# Patient Record
Sex: Female | Born: 1960 | Race: Black or African American | Hispanic: No | Marital: Married | Smoking: Never smoker
Health system: Southern US, Community
[De-identification: ages and names within clinical notes are randomized; demographics above are authoritative.]

## PROBLEM LIST (undated history)

## (undated) DIAGNOSIS — A159 Respiratory tuberculosis unspecified: Secondary | ICD-10-CM

## (undated) DIAGNOSIS — E7800 Pure hypercholesterolemia, unspecified: Secondary | ICD-10-CM

## (undated) DIAGNOSIS — E785 Hyperlipidemia, unspecified: Secondary | ICD-10-CM

## (undated) DIAGNOSIS — M47816 Spondylosis without myelopathy or radiculopathy, lumbar region: Secondary | ICD-10-CM

## (undated) DIAGNOSIS — I10 Essential (primary) hypertension: Secondary | ICD-10-CM

## (undated) DIAGNOSIS — E039 Hypothyroidism, unspecified: Secondary | ICD-10-CM

## (undated) HISTORY — PX: BACK SURGERY: SHX140

## (undated) HISTORY — DX: Hyperlipidemia, unspecified: E78.5

## (undated) HISTORY — PX: ABDOMINAL HYSTERECTOMY: SHX81

## (undated) HISTORY — PX: BREAST SURGERY: SHX581

---

## 1997-11-13 ENCOUNTER — Emergency Department (HOSPITAL_COMMUNITY): Admission: EM | Admit: 1997-11-13 | Discharge: 1997-11-13 | Payer: Self-pay | Admitting: Emergency Medicine

## 1998-06-12 ENCOUNTER — Encounter: Payer: Self-pay | Admitting: Emergency Medicine

## 1998-06-12 ENCOUNTER — Emergency Department (HOSPITAL_COMMUNITY): Admission: EM | Admit: 1998-06-12 | Discharge: 1998-06-12 | Payer: Self-pay | Admitting: Emergency Medicine

## 1999-05-03 ENCOUNTER — Emergency Department (HOSPITAL_COMMUNITY): Admission: EM | Admit: 1999-05-03 | Discharge: 1999-05-04 | Payer: Self-pay | Admitting: Emergency Medicine

## 1999-05-04 ENCOUNTER — Encounter: Payer: Self-pay | Admitting: Emergency Medicine

## 1999-11-19 ENCOUNTER — Emergency Department (HOSPITAL_COMMUNITY): Admission: EM | Admit: 1999-11-19 | Discharge: 1999-11-19 | Payer: Self-pay | Admitting: Emergency Medicine

## 2001-01-15 ENCOUNTER — Emergency Department (HOSPITAL_COMMUNITY): Admission: EM | Admit: 2001-01-15 | Discharge: 2001-01-15 | Payer: Self-pay

## 2001-01-15 ENCOUNTER — Encounter: Payer: Self-pay | Admitting: Emergency Medicine

## 2001-04-20 ENCOUNTER — Emergency Department (HOSPITAL_COMMUNITY): Admission: EM | Admit: 2001-04-20 | Discharge: 2001-04-20 | Payer: Self-pay | Admitting: Emergency Medicine

## 2001-04-23 ENCOUNTER — Emergency Department (HOSPITAL_COMMUNITY): Admission: EM | Admit: 2001-04-23 | Discharge: 2001-04-23 | Payer: Self-pay | Admitting: Emergency Medicine

## 2001-09-17 ENCOUNTER — Encounter: Payer: Self-pay | Admitting: Orthopedic Surgery

## 2001-09-17 ENCOUNTER — Encounter: Admission: RE | Admit: 2001-09-17 | Discharge: 2001-09-17 | Payer: Self-pay | Admitting: Orthopedic Surgery

## 2001-10-01 ENCOUNTER — Encounter: Admission: RE | Admit: 2001-10-01 | Discharge: 2001-10-01 | Payer: Self-pay | Admitting: Orthopedic Surgery

## 2001-10-01 ENCOUNTER — Encounter: Payer: Self-pay | Admitting: Orthopedic Surgery

## 2001-11-25 ENCOUNTER — Encounter: Admission: RE | Admit: 2001-11-25 | Discharge: 2001-11-25 | Payer: Self-pay | Admitting: *Deleted

## 2001-11-25 ENCOUNTER — Encounter: Payer: Self-pay | Admitting: Orthopedic Surgery

## 2002-02-13 ENCOUNTER — Encounter: Admission: RE | Admit: 2002-02-13 | Discharge: 2002-02-13 | Payer: Self-pay | Admitting: *Deleted

## 2002-02-13 ENCOUNTER — Encounter: Payer: Self-pay | Admitting: *Deleted

## 2003-01-11 ENCOUNTER — Encounter: Admission: RE | Admit: 2003-01-11 | Discharge: 2003-01-11 | Payer: Self-pay | Admitting: Orthopedic Surgery

## 2003-01-29 ENCOUNTER — Encounter: Admission: RE | Admit: 2003-01-29 | Discharge: 2003-01-29 | Payer: Self-pay | Admitting: Orthopedic Surgery

## 2003-05-12 ENCOUNTER — Encounter: Admission: RE | Admit: 2003-05-12 | Discharge: 2003-05-12 | Payer: Self-pay | Admitting: Neurosurgery

## 2005-02-14 ENCOUNTER — Emergency Department (HOSPITAL_COMMUNITY): Admission: EM | Admit: 2005-02-14 | Discharge: 2005-02-14 | Payer: Self-pay | Admitting: Emergency Medicine

## 2005-11-30 ENCOUNTER — Emergency Department (HOSPITAL_COMMUNITY): Admission: EM | Admit: 2005-11-30 | Discharge: 2005-11-30 | Payer: Self-pay | Admitting: Emergency Medicine

## 2006-07-09 ENCOUNTER — Emergency Department (HOSPITAL_COMMUNITY): Admission: EM | Admit: 2006-07-09 | Discharge: 2006-07-09 | Payer: Self-pay | Admitting: Emergency Medicine

## 2006-12-08 ENCOUNTER — Emergency Department (HOSPITAL_COMMUNITY): Admission: EM | Admit: 2006-12-08 | Discharge: 2006-12-08 | Payer: Self-pay | Admitting: Family Medicine

## 2007-02-13 ENCOUNTER — Ambulatory Visit (HOSPITAL_COMMUNITY): Admission: RE | Admit: 2007-02-13 | Discharge: 2007-02-13 | Payer: Self-pay | Admitting: Cardiovascular Disease

## 2007-05-09 ENCOUNTER — Encounter: Admission: RE | Admit: 2007-05-09 | Discharge: 2007-05-09 | Payer: Self-pay | Admitting: Neurosurgery

## 2007-05-28 ENCOUNTER — Ambulatory Visit (HOSPITAL_COMMUNITY): Admission: RE | Admit: 2007-05-28 | Discharge: 2007-05-29 | Payer: Self-pay | Admitting: Neurosurgery

## 2007-06-18 ENCOUNTER — Encounter: Admission: RE | Admit: 2007-06-18 | Discharge: 2007-06-18 | Payer: Self-pay | Admitting: Internal Medicine

## 2007-07-01 ENCOUNTER — Ambulatory Visit (HOSPITAL_COMMUNITY): Admission: RE | Admit: 2007-07-01 | Discharge: 2007-07-01 | Payer: Self-pay | Admitting: Obstetrics & Gynecology

## 2007-07-04 ENCOUNTER — Encounter: Payer: Self-pay | Admitting: Obstetrics

## 2007-07-04 ENCOUNTER — Inpatient Hospital Stay (HOSPITAL_COMMUNITY): Admission: RE | Admit: 2007-07-04 | Discharge: 2007-07-07 | Payer: Self-pay | Admitting: Obstetrics

## 2007-08-11 ENCOUNTER — Emergency Department (HOSPITAL_COMMUNITY): Admission: EM | Admit: 2007-08-11 | Discharge: 2007-08-11 | Payer: Self-pay | Admitting: Emergency Medicine

## 2007-09-02 ENCOUNTER — Encounter: Admission: RE | Admit: 2007-09-02 | Discharge: 2007-09-02 | Payer: Self-pay | Admitting: Neurosurgery

## 2008-07-29 ENCOUNTER — Emergency Department (HOSPITAL_COMMUNITY): Admission: EM | Admit: 2008-07-29 | Discharge: 2008-07-30 | Payer: Self-pay | Admitting: Emergency Medicine

## 2008-09-22 ENCOUNTER — Emergency Department (HOSPITAL_COMMUNITY): Admission: EM | Admit: 2008-09-22 | Discharge: 2008-09-22 | Payer: Self-pay | Admitting: Family Medicine

## 2008-11-03 ENCOUNTER — Emergency Department (HOSPITAL_COMMUNITY): Admission: EM | Admit: 2008-11-03 | Discharge: 2008-11-03 | Payer: Self-pay | Admitting: Family Medicine

## 2009-02-23 ENCOUNTER — Emergency Department (HOSPITAL_COMMUNITY): Admission: EM | Admit: 2009-02-23 | Discharge: 2009-02-23 | Payer: Self-pay | Admitting: Emergency Medicine

## 2009-04-24 ENCOUNTER — Emergency Department (HOSPITAL_COMMUNITY): Admission: EM | Admit: 2009-04-24 | Discharge: 2009-04-24 | Payer: Self-pay | Admitting: Family Medicine

## 2009-11-26 ENCOUNTER — Emergency Department (HOSPITAL_COMMUNITY)
Admission: EM | Admit: 2009-11-26 | Discharge: 2009-11-26 | Payer: Self-pay | Source: Home / Self Care | Admitting: Emergency Medicine

## 2010-01-30 ENCOUNTER — Emergency Department (HOSPITAL_COMMUNITY)
Admission: EM | Admit: 2010-01-30 | Discharge: 2010-01-30 | Payer: Self-pay | Source: Home / Self Care | Admitting: Emergency Medicine

## 2010-01-31 ENCOUNTER — Ambulatory Visit (HOSPITAL_COMMUNITY): Admission: RE | Admit: 2010-01-31 | Discharge: 2010-01-31 | Payer: Self-pay | Admitting: Gastroenterology

## 2010-02-16 ENCOUNTER — Emergency Department (HOSPITAL_COMMUNITY)
Admission: EM | Admit: 2010-02-16 | Discharge: 2010-02-16 | Payer: Self-pay | Source: Home / Self Care | Admitting: Family Medicine

## 2010-04-02 ENCOUNTER — Encounter: Payer: Self-pay | Admitting: Obstetrics

## 2010-05-23 LAB — CBC
HCT: 37.1 % (ref 36.0–46.0)
Hemoglobin: 12.4 g/dL (ref 12.0–15.0)
MCH: 30 pg (ref 26.0–34.0)
MCHC: 33.4 g/dL (ref 30.0–36.0)
MCV: 89.8 fL (ref 78.0–100.0)
Platelets: 303 10*3/uL (ref 150–400)
RBC: 4.13 MIL/uL (ref 3.87–5.11)
RDW: 13 % (ref 11.5–15.5)
WBC: 6.4 10*3/uL (ref 4.0–10.5)

## 2010-05-23 LAB — DIFFERENTIAL
Basophils Absolute: 0 10*3/uL (ref 0.0–0.1)
Basophils Relative: 0 % (ref 0–1)
Eosinophils Absolute: 0.1 K/uL (ref 0.0–0.7)
Eosinophils Relative: 1 % (ref 0–5)
Lymphocytes Relative: 40 % (ref 12–46)
Lymphs Abs: 2.6 K/uL (ref 0.7–4.0)
Monocytes Absolute: 0.3 10*3/uL (ref 0.1–1.0)
Monocytes Relative: 5 % (ref 3–12)
Neutro Abs: 3.4 K/uL (ref 1.7–7.7)
Neutrophils Relative %: 53 % (ref 43–77)

## 2010-05-23 LAB — COMPREHENSIVE METABOLIC PANEL
ALT: 18 U/L (ref 0–35)
AST: 20 U/L (ref 0–37)
Albumin: 3.5 g/dL (ref 3.5–5.2)
Alkaline Phosphatase: 74 U/L (ref 39–117)
Chloride: 107 mEq/L (ref 96–112)
GFR calc Af Amer: >60 mL/min (ref >60)
Potassium: 3.7 mEq/L (ref 3.5–5.1)
Sodium: 139 mEq/L (ref 135–145)
Total Bilirubin: 0.4 mg/dL (ref 0.3–1.2)

## 2010-05-23 LAB — URINALYSIS, ROUTINE W REFLEX MICROSCOPIC
Bilirubin Urine: NEGATIVE
Glucose, UA: NEGATIVE mg/dL
Ketones, ur: NEGATIVE mg/dL
Leukocytes, UA: NEGATIVE
Nitrite: NEGATIVE
Protein, ur: NEGATIVE mg/dL
Specific Gravity, Urine: 1.021 (ref 1.005–1.030)
Urobilinogen, UA: 0.2 mg/dL (ref 0.0–1.0)
pH: 6 (ref 5.0–8.0)

## 2010-05-23 LAB — URINE MICROSCOPIC-ADD ON

## 2010-05-23 LAB — COMPREHENSIVE METABOLIC PANEL WITH GFR
BUN: 14 mg/dL (ref 6–23)
CO2: 26 meq/L (ref 19–32)
Calcium: 9 mg/dL (ref 8.4–10.5)
Creatinine, Ser: 0.72 mg/dL (ref 0.4–1.2)
GFR calc non Af Amer: >60 mL/min (ref >60)
Glucose, Bld: 97 mg/dL (ref 70–99)
Total Protein: 6.7 g/dL (ref 6.0–8.3)

## 2010-05-23 LAB — PROTIME-INR
INR: 0.98 (ref 0.00–1.49)
Prothrombin Time: 13.2 s (ref 11.6–15.2)

## 2010-05-23 LAB — APTT: aPTT: 33 s (ref 24–37)

## 2010-05-23 LAB — URINE CULTURE
Colony Count: NO GROWTH
Culture  Setup Time: 201111211419
Culture: NO GROWTH

## 2010-05-23 LAB — HEMOCCULT GUIAC POC 1CARD (OFFICE): Fecal Occult Bld: NEGATIVE

## 2010-05-31 LAB — POCT URINALYSIS DIP (DEVICE)
Protein, ur: NEGATIVE mg/dL
Specific Gravity, Urine: 1.025 (ref 1.005–1.030)
Urobilinogen, UA: 0.2 mg/dL (ref 0.0–1.0)

## 2010-05-31 LAB — URINE CULTURE

## 2010-06-17 LAB — POCT URINALYSIS DIP (DEVICE)
Glucose, UA: NEGATIVE mg/dL
Ketones, ur: NEGATIVE mg/dL
Specific Gravity, Urine: 1.02 (ref 1.005–1.030)
Urobilinogen, UA: 0.2 mg/dL (ref 0.0–1.0)

## 2010-07-08 ENCOUNTER — Inpatient Hospital Stay (INDEPENDENT_AMBULATORY_CARE_PROVIDER_SITE_OTHER)
Admission: RE | Admit: 2010-07-08 | Discharge: 2010-07-08 | Disposition: A | Discharge status: Home / Self Care | Payer: Federal, State, Local not specified - PPO | Source: Ambulatory Visit | Attending: Family Medicine | Admitting: Family Medicine

## 2010-07-08 DIAGNOSIS — N200 Calculus of kidney: Secondary | ICD-10-CM

## 2010-07-08 LAB — POCT URINALYSIS DIP (DEVICE)
Bilirubin Urine: NEGATIVE
Glucose, UA: NEGATIVE mg/dL
Nitrite: NEGATIVE
Urobilinogen, UA: 0.2 mg/dL (ref 0.0–1.0)

## 2010-07-10 ENCOUNTER — Emergency Department (HOSPITAL_COMMUNITY): Payer: Federal, State, Local not specified - PPO

## 2010-07-10 ENCOUNTER — Emergency Department (HOSPITAL_COMMUNITY)
Admission: EM | Admit: 2010-07-10 | Discharge: 2010-07-10 | Disposition: A | Discharge status: Home / Self Care | Payer: Federal, State, Local not specified - PPO | Attending: Emergency Medicine | Admitting: Emergency Medicine

## 2010-07-10 DIAGNOSIS — R109 Unspecified abdominal pain: Secondary | ICD-10-CM | POA: Insufficient documentation

## 2010-07-10 DIAGNOSIS — K921 Melena: Secondary | ICD-10-CM | POA: Insufficient documentation

## 2010-07-10 DIAGNOSIS — E7800 Pure hypercholesterolemia, unspecified: Secondary | ICD-10-CM | POA: Insufficient documentation

## 2010-07-10 DIAGNOSIS — K625 Hemorrhage of anus and rectum: Secondary | ICD-10-CM | POA: Insufficient documentation

## 2010-07-10 DIAGNOSIS — Z79899 Other long term (current) drug therapy: Secondary | ICD-10-CM | POA: Insufficient documentation

## 2010-07-10 DIAGNOSIS — K644 Residual hemorrhoidal skin tags: Secondary | ICD-10-CM | POA: Insufficient documentation

## 2010-07-10 DIAGNOSIS — N201 Calculus of ureter: Secondary | ICD-10-CM | POA: Insufficient documentation

## 2010-07-10 DIAGNOSIS — I10 Essential (primary) hypertension: Secondary | ICD-10-CM | POA: Insufficient documentation

## 2010-07-10 DIAGNOSIS — R6883 Chills (without fever): Secondary | ICD-10-CM | POA: Insufficient documentation

## 2010-07-10 DIAGNOSIS — E0590 Thyrotoxicosis, unspecified without thyrotoxic crisis or storm: Secondary | ICD-10-CM | POA: Insufficient documentation

## 2010-07-10 LAB — URINALYSIS, ROUTINE W REFLEX MICROSCOPIC
Glucose, UA: NEGATIVE mg/dL
Ketones, ur: NEGATIVE mg/dL
Nitrite: NEGATIVE
Protein, ur: NEGATIVE mg/dL
Urobilinogen, UA: 0.2 mg/dL (ref 0.0–1.0)

## 2010-07-10 LAB — POCT I-STAT, CHEM 8
Calcium, Ion: 1.28 mmol/L (ref 1.12–1.32)
HCT: 36 % (ref 36.0–46.0)
Hemoglobin: 12.2 g/dL (ref 12.0–15.0)
Sodium: 143 mEq/L (ref 135–145)
TCO2: 29 mmol/L (ref 0–100)

## 2010-07-10 LAB — OCCULT BLOOD, POC DEVICE: Fecal Occult Bld: POSITIVE

## 2010-07-10 MED ORDER — IOHEXOL 300 MG/ML  SOLN
80.0000 mL | Freq: Once | INTRAMUSCULAR | Status: AC | PRN
  Administered 2010-07-10: 80 mL via INTRAVENOUS

## 2010-07-25 NOTE — Op Note (Signed)
Autumn Green, Autumn Green                 ACCOUNT NO.:  [id number]   MEDICAL RECORD NO.:  [id number]          PATIENT TYPE:  INP   LOCATION:  9308                          FACILITY:  [facility]   PHYSICIAN:  Adolph Pollack, M.D.DATE OF BIRTH:  [birth year]   DATE OF PROCEDURE:  07/04/2007  DATE OF DISCHARGE:                               OPERATIVE REPORT   PREOPERATIVE DIAGNOSES:  Pelvic mass.   POSTOPERATIVE DIAGNOSES:  Pelvic mass with dense small bowel to pelvic  mass adhesions and enterotomy.   PROCEDURE:  Lysis of adhesions, excision of cystic pelvic mass and  repair of enterotomy.   SURGEON:  Adolph Pollack, M.D.   ASSISTANT:  Bing Neighbors. Clearance Coots, M.D.   INDICATIONS:  A 50 year old female was brought to the operating room by  Dr. Clearance Coots for excision of pelvic mass.  I was called in as adhesions  between the mass and small bowel were dense and enterotomy was made.   The patient had a lower transverse incision.  The enterotomy was  visualized on the right side.  I first began taking down dense adhesions  between the cyst in the inferior abdominal wall sharply.  I then used  traction on the cystic and counter traction on the small bowel to divide  the thin adhesions between the cyst and the small bowel in all  directions freeing up the cyst from the small bowel.  I made two small  holes in the cyst and clear fluid was drained and sent for cytology.  The cyst was eventually excised sharply.  It took 45 minutes to mobilize  the adhesions between the cyst and small bowel and allow the cyst to be  excised.   Following this, I then mobilized a segment of small intestine with the  enterotomy using sharp dissection and electrocautery dissecting it free  from the anterior abdominal wall.  I then repaired the enterotomy which  was about 25% of the circumference in two layers.  The first layer was a  running full-thickness 3-0 Vicryl suture.  The second layer was  interrupted 3-0 silk  sutures in a Lembert type fashion.  The anastomosis  was tight without leak with testing and was patent and viable.  I  reinspected the rest of the small intestine in the area and noted no  injuries. Following this, Dr. Clearance Coots proceeded with the remaining  portion of his operation.      Adolph Pollack, M.D.  Electronically Signed     TJR/MEDQ  D:  07/04/2007  T:  07/04/2007  Job:  [id number]   cc:   Leonette Most A. Clearance Coots, M.D.  Fax: [phone]

## 2010-07-25 NOTE — Op Note (Signed)
NAME:  Autumn Green, ROA NO.:  [id number]   MEDICAL RECORD NO.:  [id number]          PATIENT TYPE:  OIB   LOCATION:  3533                         FACILITY:  [facility]   PHYSICIAN:  Donalee Citrin, M.D.        DATE OF BIRTH:  27-May-1960   DATE OF PROCEDURE:  05/28/2007  DATE OF DISCHARGE:                               OPERATIVE REPORT   PREOPERATIVE DIAGNOSIS:  Lumbar spondylosis with radiculopathy and  ruptured disk L4-5 right.   PROCEDURE:  Lumbar laminectomy microdiskectomy L4-5 right with  microdissection of the right L5 nerve root microscopic diskectomy.   SURGEON:  Donalee Citrin, M.D.   ASSISTANT:  Reinaldo Meeker, M.D.   ANESTHESIA:  General endotracheal anesthesia.   HISTORY OF PRESENT ILLNESS:  The patient is very pleasant 47-year female  who has a longstanding back pain and right leg pain from a work-related  injury that we have followed along for several years and she got  progressively worse.  Failed all forms of conservative treatment.  MRI  scan showed severe spondylosis but also foraminal stenosis from an  annular tear and a bulging disk at L4-5 causing displacing of the right  L5 nerve root.  Due to the patient's failure to conservative treatment  and MRI findings, patient was recommended laminectomy, diskectomy.  Risks and benefits of the operation were explained to the patient who  understands and agreed to proceed forward.   The patient was brought to the OR and after general anesthesia, was  positioned prone on the Wilson frame.  Back was prepped and draped in  the usual sterile fashion.  Preoperative x-ray was used to localize the  appropriate level.  A midline incision after infiltration with 10 mL  lidocaine with epinephrine and Bovie electrocautery was used to take  down __________ subperiosteal.  Dissection carried out on the lamina of  L4 and L5.  Intraoperative x-ray confirmed localization of the  appropriate level and as I was drilling off the  lateral aspect of the  tip of the spinous process, I realized that I had taken the muscle away  and stripped it down on the left side so I immediately removed the  retractor and confirmed over the right side, did a subperiosteal  dissection on the right side at L4-5, confirmed with x-ray.  No  significant bony laminotomy work was done at all on the left side prior  to transferring the retractor.  Then, after confirming appropriate level  on the right side at L4-5, the high speed drill was used to drill in the  medial facet complex, inferior aspect of lamina of L4, superior aspect  of lamina of L5 and lateral on spinous process.  Then using a 2 mm  Kerrison punch, superior lamina of L4 was removed, medial facet complex  was under-bitten and the superior aspect of the lamina of five was  removed.  Ligament was then identified, teased away with a nerve hook  and removed piecemeal fashion exposing the thecal sac.  At this point,  the operative microscope was draped and brought into  the field for  microscopic illumination.  The undersurface of the ligament was  dissected off of the dura and under-bitten laterally.  What was noted on  the right side was significant diastasis of the right L4-5 facet.  This  probably had about 2-3 mm of diastasis, so care was taken to minimize  the amount of facetectomy performed just enough to gain lateral aspect  to the lateral margins of the dura.  There was noted to be dense  epidural venous collection, these were coagulated with bipolar  electrocautery and divided.  Exposing the disk space, there was noted to  be bulging and displacing the inferior aspect of the L5 nerve root.  The  L5 nerve root was then dissected off of this very carefully with IV  Penfield and reflected medially with a D'Errico.  Then, at this point,  the annulotomy was made with a #11 blade scalpel and disk space was  radically cleaned out and several large fragments were removed from  the  disk.  Then after complete decompression of the L5 nerve root explored  with a coronary dilating hockey stick, the wound was then copiously  irrigated.  Meticulous hemostasis was maintained.  Gelfoam overlaid at  the top of the dura.  The muscle and fascia were closed in layers with  interrupted Vicryl as well as the subcutaneous tissues in the dermis was  closed with running 4-0 subcuticular.  Benzoin and Steri-Strips was  applied and the patient went to the recovery room in stable condition.  At the end of the case sponge and needle counts were correct.           ______________________________  Donalee Citrin, M.D.     GC/MEDQ  D:  05/28/2007  T:  05/28/2007  Job:  [id number]

## 2010-07-25 NOTE — Consult Note (Signed)
NAMEARLESIA, KIEL                 ACCOUNT NO.:  [id number]   MEDICAL RECORD NO.:  [id number]          PATIENT TYPE:  INP   LOCATION:  9308                          FACILITY:  [facility]   PHYSICIAN:  Adolph Pollack, M.D.DATE OF BIRTH:  1960/09/09   DATE OF CONSULTATION:  07/04/2007  DATE OF DISCHARGE:                                 CONSULTATION   REFERRING PHYSICIAN:  Charles A. Clearance Coots, M.D.   HISTORY:  This is a 50 year old female who was found to have what  appeared to be a cystic pelvic mass.  Dr. Clearance Coots brought her to the  operating room at [facility] for exploratory laparotomy and  noticed dense adhesions between the small intestine and the mass and  also a small enterotomy was made.  I subsequently was called for  intraoperative consultation and to assist with adhesions and repair of  enterotomy.   Upon arrival, the patient was supine on the table with a lower  transverse incision.  There was a suture by small enterotomy in the  small intestine near the right lower abdominal sidewall adherent to the  peritoneum and rectus muscle with minimal spillage.  There was also a  cystic structure with dense adhesions to the small bowel.   IMPRESSION:  Dense pelvic adhesions from multiple previous surgeries and  small enterotomy as well.   PLAN:  Will perform adhesiolysis and repair of enterotomy.      Adolph Pollack, M.D.  Electronically Signed     TJR/MEDQ  D:  07/04/2007  T:  07/04/2007  Job:  [id number]   cc:   Leonette Most A. Clearance Coots, M.D.  Fax: [phone]

## 2010-07-25 NOTE — Op Note (Signed)
Autumn Green, Autumn Green                 ACCOUNT NO.:  [id number]   MEDICAL RECORD NO.:  [id number]          PATIENT TYPE:  INP   LOCATION:  9308                          [facility]   PHYSICIAN:  Charles A. Clearance Coots, M.D.DATE OF BIRTH:  10/21/1960   DATE OF PROCEDURE:  07/04/2007  DATE OF DISCHARGE:                               OPERATIVE REPORT   PREOPERATIVE DIAGNOSES:  Right adnexal cyst on ultrasound and computed  tomography scan, pelvic pain, status post total abdominal hysterectomy,  right salpingo-oophorectomy, left salpingectomy.   POSTOPERATIVE DIAGNOSES:  Right adnexal cyst on ultrasound and computed  tomography scan, pelvic pain, status post total abdominal hysterectomy,  right salpingo-oophorectomy, left salpingectomy.  Multiple bowel  adhesions with cyst and anterior and lateral peritoneal surface.   PROCEDURE:  Exploratory laparotomy, adhesiolysis, resection of  peritoneal cyst, repair of small enterotomy.   SURGEON:  1. Charles A. Clearance Coots, M.D.  2. Roseanna Rainbow, M.D.  3. Adolph Pollack, M.D.   ANESTHESIA:  General.   ESTIMATED BLOOD LOSS:  500 mL   IV FLUIDS:  4500 mL   URINE OUTPUT:  300 mL, clear.   SPECIMEN:  1. Peritoneal cyst.  2. Pelvic washings   DISPOSITION/SPECIMEN:  Pathology.   DESCRIPTION OF PROCEDURE:  The patient was brought to the operating  room.  After satisfactory general endotracheal anesthesia, the abdomen  was prepped and draped in routine fashion with an indwelling Foley.  A  Pfannenstiel skin incision was made through the previous scar with a  scalpel down to the fascia.  The fascia was nicked in the midline and  the fascial incision was extended to the left and to right with curved  Mayo scissors.  The superior and inferior fascial edges were taken off  of the rectus muscles with both blunt and sharp dissection.  The rectus  muscle was sharply divided in the midline and the peritoneum was entered  digitally and was  digitally extended to the left and to right.  Abdominal and pelvic exploration revealed multiple bowel adhesions  between the small bowel and the anterior and lateral peritoneal  surfaces.  The peritoneal cyst was then identified anterolaterally,  adhesed to the peritoneal wall and bowel.  General surgery was then  called for assistance with lysis of adhesions and resection of the  peritoneal incision.  Dr. Avel Peace assisted.  At the time of his  exploration, a small enterotomy was noted in which he repaired and then  proceeded with adhesiolysis.  This will be dictated separately by Dr.  Abbey Chatters.  The peritoneal cyst after being freed from the bowel and  peritoneal surfaces was then resected and submitted to pathology for  evaluation.  Hemostasis was then obtained with cauterization and suture  ligation of peritoneal surfaces.  Further exploration of the pelvic  cavity was done and no further pathology was observed.  The abdomen was  then closed.  The rectus muscle was closed with a continuous suture of 2-  0 Monocryl.  The fascia was closed with a continuous suture of 0 PDS  from each corner to  the center.  The subcutaneous tissue was thoroughly  irrigated with warm saline solution and all areas of subcutaneous  bleeding were coagulated with the Bovie.  The skin was then closed with  a continuous subcuticular suture of 3-0 Monocryl.  A sterile bandage was  applied to the incision closure.  The surgical technician indicated that  all needle, sponge and instrument counts were correct x2.  The patient  tolerated the procedure well and was transported to recovery room in  satisfactory condition.      Charles A. Clearance Coots, M.D.  Electronically Signed     CAH/MEDQ  D:  07/04/2007  T:  07/04/2007  Job:  [id number]

## 2010-07-28 NOTE — Discharge Summary (Signed)
Autumn Green, Autumn Green                 ACCOUNT NO.:  [id number]   MEDICAL RECORD NO.:  [id number]          PATIENT TYPE:  INP   LOCATION:  9308                          [facility]ILITY[facility]   PHYSICIAN:  Charles A. Clearance Coots, M.D.DATE OF BIRTH:  06-11-1960   DATE OF ADMISSION:  07/04/2007  DATE OF DISCHARGE:  07/07/2007                               DISCHARGE SUMMARY   ADMITTING DIAGNOSES:  1. Right adnexal mass, status post total abdominal hysterectomy.  2. Right salpingo-oophorectomy.  3. Left salpingectomy.   DISCHARGE DIAGNOSES:  1. Right adnexal mass, status post total abdominal hysterectomy.  2. Right salpingo-oophorectomy.  3. Left salpingectomy.   Discharge home in good condition after exploratory laparotomy, lysis of  adhesions, and repair of small enterotomy and resection of right adnexal  mass.   REASON FOR ADMISSION:  A 50 year old black female referred from Dr.  Dorothyann Peng and tried internal medicine for the right adnexal mass and  pelvic pain.  The patient's status post total abdominal hysterectomy  right salpingo-oophorectomy and left salpingectomy in 2004, presented  with pelvic pain and CT scan and ultrasound revealed approximately 10 cm  right adnexal cystic and septated mass.  CA-125 levels were drawn, which  were within normal limits.   PAST MEDICAL HISTORY/SURGERY:  1. Back surgery in 2009.  2. Lysis of adhesions and left salpingectomy for ectopic pregnancy in      2004.  3. Total abdominal hysterectomy, right salpingo-oophorectomy and left      salpingectomy in 2004.   ILLNESSES:  Hypertension.   MEDICATIONS:  1. Propranolol.  2. Hydrochlorothiazide.   ALLERGIES:  No known drug allergies.   FAMILY HISTORY:  Positive for colon cancer and bone cancer.   PHYSICAL EXAMINATION:  Well-nourished, well-developed female in no acute  distress.  Temperature 97.9, pulse 80, respiratory rate 18, and blood pressure  138/87.  LUNGS: Clear to auscultation  bilaterally.  HEART: Regular rate and rhythm.  ABDOMEN: Soft, tender right lower quadrant.  PELVIC EXAM: She has normal external female genitalia.  Vaginal mucosa  normal.  Cervix absent.  Uterus absent.  ADNEXAL EXAM: Remarkable for right adnexal tenderness.  EXTREMITIES: Without cyanosis, clubbing or erythema.   ADMITTING LABS:  Hemoglobin 12.4, hematocrit 35, white blood cell count  6300, and platelets 286,000.  Comprehensive metabolic panel was within  normal limits.  CA-125 levels were within normal limits.   HOSPITAL COURSE:  The patient underwent exploratory laparotomy, lysis of  multiple adhesions, repair of small bowel enterotomy, and resection of  right adnexal mass, which appeared to be peritoneal cyst.  There were no  other intraoperative complications except for the small enterotomy in  the small intestine, which was repaired by Dr. Avel Peace, general  surgeon.  The patient did well postoperatively and she was discharged  home on postop day #3 in good condition.   DISCHARGE LABS:  Hemoglobin 9.9, hematocrit 27.7, white blood cell count  12,100, and platelets 248,000.   DISCHARGE DISPOSITION:  Medications:  Tylox and ibuprofen were  prescribed for pain.  Routine written instructions were given for  discharge after major  abdominal surgery.  The patient is to call office  for follow up appointment in 2 weeks.      Charles A. Clearance Coots, M.D.  Electronically Signed     CAH/MEDQ  D:  07/24/2007  T:  07/24/2007  Job:  [id number]   cc:   Adolph Pollack, M.D.  [address]  [place]  [place] [address]   Candyce Churn. Allyne Gee, M.D.  Fax: [phone]

## 2010-10-05 ENCOUNTER — Other Ambulatory Visit: Payer: Self-pay | Admitting: Neurosurgery

## 2010-10-05 DIAGNOSIS — M545 Low back pain, unspecified: Secondary | ICD-10-CM

## 2010-10-19 ENCOUNTER — Other Ambulatory Visit: Payer: Federal, State, Local not specified - PPO

## 2010-10-20 ENCOUNTER — Other Ambulatory Visit: Payer: Self-pay | Admitting: Neurosurgery

## 2010-10-20 ENCOUNTER — Ambulatory Visit
Admission: RE | Admit: 2010-10-20 | Discharge: 2010-10-20 | Disposition: A | Discharge status: Home / Self Care | Payer: Federal, State, Local not specified - PPO | Source: Ambulatory Visit | Attending: Neurosurgery | Admitting: Neurosurgery

## 2010-10-20 ENCOUNTER — Ambulatory Visit
Admission: RE | Admit: 2010-10-20 | Discharge: 2010-10-20 | Disposition: A | Discharge status: Home / Self Care | Payer: Self-pay | Source: Ambulatory Visit | Attending: Neurosurgery | Admitting: Neurosurgery

## 2010-10-20 DIAGNOSIS — M545 Low back pain, unspecified: Secondary | ICD-10-CM

## 2010-10-29 ENCOUNTER — Ambulatory Visit (INDEPENDENT_AMBULATORY_CARE_PROVIDER_SITE_OTHER): Payer: Federal, State, Local not specified - PPO

## 2010-10-29 ENCOUNTER — Inpatient Hospital Stay (INDEPENDENT_AMBULATORY_CARE_PROVIDER_SITE_OTHER)
Admission: RE | Admit: 2010-10-29 | Discharge: 2010-10-29 | Disposition: A | Discharge status: Home / Self Care | Payer: Federal, State, Local not specified - PPO | Source: Ambulatory Visit | Attending: Family Medicine | Admitting: Family Medicine

## 2010-10-29 DIAGNOSIS — R10819 Abdominal tenderness, unspecified site: Secondary | ICD-10-CM

## 2010-10-29 LAB — DIFFERENTIAL
Basophils Absolute: 0 10*3/uL (ref 0.0–0.1)
Basophils Relative: 0 % (ref 0–1)
Lymphocytes Relative: 45 % (ref 12–46)
Monocytes Absolute: 0.5 10*3/uL (ref 0.1–1.0)
Neutro Abs: 2.8 10*3/uL (ref 1.7–7.7)

## 2010-10-29 LAB — POCT URINALYSIS DIP (DEVICE)
Glucose, UA: NEGATIVE mg/dL
Leukocytes, UA: NEGATIVE
Nitrite: NEGATIVE
Urobilinogen, UA: 0.2 mg/dL (ref 0.0–1.0)

## 2010-10-29 LAB — COMPREHENSIVE METABOLIC PANEL
AST: 16 U/L (ref 0–37)
BUN: 14 mg/dL (ref 6–23)
CO2: 31 mEq/L (ref 19–32)
Calcium: 10.1 mg/dL (ref 8.4–10.5)
Creatinine, Ser: 0.68 mg/dL (ref 0.50–1.10)
GFR calc Af Amer: >60 mL/min (ref >60)
GFR calc non Af Amer: >60 mL/min (ref >60)

## 2010-10-29 LAB — CBC
MCH: 29.5 pg (ref 26.0–34.0)
MCV: 85.7 fL (ref 78.0–100.0)
Platelets: 312 10*3/uL (ref 150–400)
RBC: 4.34 MIL/uL (ref 3.87–5.11)

## 2010-10-31 ENCOUNTER — Other Ambulatory Visit: Payer: Self-pay | Admitting: Gastroenterology

## 2010-10-31 ENCOUNTER — Ambulatory Visit
Admission: RE | Admit: 2010-10-31 | Discharge: 2010-10-31 | Disposition: A | Discharge status: Home / Self Care | Payer: Federal, State, Local not specified - PPO | Source: Ambulatory Visit | Attending: Gastroenterology | Admitting: Gastroenterology

## 2010-10-31 MED ORDER — IOHEXOL 300 MG/ML  SOLN
125.0000 mL | Freq: Once | INTRAMUSCULAR | Status: AC | PRN
  Administered 2010-10-31: 125 mL via INTRAVENOUS

## 2010-11-07 ENCOUNTER — Other Ambulatory Visit: Payer: Self-pay | Admitting: Neurosurgery

## 2010-11-07 DIAGNOSIS — M549 Dorsalgia, unspecified: Secondary | ICD-10-CM

## 2010-11-08 ENCOUNTER — Ambulatory Visit
Admission: RE | Admit: 2010-11-08 | Discharge: 2010-11-08 | Disposition: A | Discharge status: Home / Self Care | Payer: Worker's Compensation | Source: Ambulatory Visit | Attending: Neurosurgery | Admitting: Neurosurgery

## 2010-11-08 ENCOUNTER — Ambulatory Visit
Admission: RE | Admit: 2010-11-08 | Discharge: 2010-11-08 | Disposition: A | Discharge status: Home / Self Care | Payer: Self-pay | Source: Ambulatory Visit | Attending: Neurosurgery | Admitting: Neurosurgery

## 2010-11-08 ENCOUNTER — Other Ambulatory Visit: Payer: Self-pay | Admitting: Neurosurgery

## 2010-11-08 DIAGNOSIS — M545 Low back pain, unspecified: Secondary | ICD-10-CM

## 2010-11-08 DIAGNOSIS — M549 Dorsalgia, unspecified: Secondary | ICD-10-CM

## 2010-11-08 HISTORY — DX: Respiratory tuberculosis unspecified: A15.9

## 2010-11-08 HISTORY — DX: Hypothyroidism, unspecified: E03.9

## 2010-11-08 HISTORY — DX: Essential (primary) hypertension: I10

## 2010-11-08 MED ORDER — KETOROLAC TROMETHAMINE 30 MG/ML IJ SOLN
30.0000 mg | Freq: Once | INTRAMUSCULAR | Status: AC
  Administered 2010-11-08: 30 mg via INTRAVENOUS

## 2010-11-08 MED ORDER — SODIUM CHLORIDE 0.9 % IV SOLN: 4.0000 mg | Freq: Four times a day (QID) | INTRAVENOUS | Status: DC | PRN

## 2010-11-08 MED ORDER — DEXTROSE 5 % IV SOLN
1.0000 g | Freq: Once | INTRAVENOUS | Status: AC
  Administered 2010-11-08: 1 g via INTRAVENOUS

## 2010-11-08 MED ORDER — SODIUM CHLORIDE 0.9 % IV SOLN
Freq: Once | INTRAVENOUS | Status: AC
  Administered 2010-11-08: 08:00:00 via INTRAVENOUS

## 2010-11-08 MED ORDER — IOHEXOL 180 MG/ML  SOLN
5.0000 mL | Freq: Once | INTRAMUSCULAR | Status: AC | PRN
  Administered 2010-11-08: 5 mL

## 2010-11-08 MED ORDER — MIDAZOLAM HCL 2 MG/2ML IJ SOLN
1.0000 mg | INTRAMUSCULAR | Status: DC | PRN
  Administered 2010-11-08: 1 mg via INTRAVENOUS

## 2010-11-08 MED ORDER — FENTANYL CITRATE 0.05 MG/ML IJ SOLN
25.0000 ug | INTRAMUSCULAR | Status: DC | PRN
  Administered 2010-11-08: 75 ug via INTRAVENOUS

## 2010-11-08 NOTE — Progress Notes (Signed)
Resting comfortably on stomach after CT scan.  Tolerating sips of Sprite.  jkl

## 2010-11-08 NOTE — Progress Notes (Signed)
Pt states her back pain started in May 2012 and has advanced to pain down her left leg. Pt is at a 5-6 at present on 0-10 scale. Pt is here for a discogram.

## 2010-11-16 ENCOUNTER — Other Ambulatory Visit: Payer: Self-pay | Admitting: Gastroenterology

## 2010-11-16 DIAGNOSIS — R102 Pelvic and perineal pain: Secondary | ICD-10-CM

## 2010-11-21 ENCOUNTER — Emergency Department (HOSPITAL_COMMUNITY): Payer: Federal, State, Local not specified - PPO

## 2010-11-21 ENCOUNTER — Other Ambulatory Visit: Payer: Worker's Compensation

## 2010-11-21 ENCOUNTER — Emergency Department (HOSPITAL_COMMUNITY)
Admission: EM | Admit: 2010-11-21 | Discharge: 2010-11-21 | Disposition: A | Discharge status: Home / Self Care | Payer: Federal, State, Local not specified - PPO | Attending: Emergency Medicine | Admitting: Emergency Medicine

## 2010-11-21 DIAGNOSIS — N949 Unspecified condition associated with female genital organs and menstrual cycle: Secondary | ICD-10-CM | POA: Insufficient documentation

## 2010-11-21 DIAGNOSIS — Z90710 Acquired absence of both cervix and uterus: Secondary | ICD-10-CM | POA: Insufficient documentation

## 2010-11-21 DIAGNOSIS — Z9079 Acquired absence of other genital organ(s): Secondary | ICD-10-CM | POA: Insufficient documentation

## 2010-11-21 DIAGNOSIS — R109 Unspecified abdominal pain: Secondary | ICD-10-CM | POA: Insufficient documentation

## 2010-11-21 DIAGNOSIS — E7800 Pure hypercholesterolemia, unspecified: Secondary | ICD-10-CM | POA: Insufficient documentation

## 2010-11-21 DIAGNOSIS — I10 Essential (primary) hypertension: Secondary | ICD-10-CM | POA: Insufficient documentation

## 2010-11-21 LAB — COMPREHENSIVE METABOLIC PANEL
ALT: 18 U/L (ref 0–35)
AST: 16 U/L (ref 0–37)
Albumin: 3.6 g/dL (ref 3.5–5.2)
Alkaline Phosphatase: 88 U/L (ref 39–117)
BUN: 14 mg/dL (ref 6–23)
CO2: 29 mEq/L (ref 19–32)
Calcium: 10 mg/dL (ref 8.4–10.5)
Chloride: 100 mEq/L (ref 96–112)
Creatinine, Ser: 0.63 mg/dL (ref 0.50–1.10)
GFR calc Af Amer: >60 mL/min (ref >60)
GFR calc non Af Amer: >60 mL/min (ref >60)
Glucose, Bld: 103 mg/dL — ABNORMAL HIGH (ref 70–99)
Potassium: 3.5 mEq/L (ref 3.5–5.1)
Sodium: 138 mEq/L (ref 135–145)
Total Bilirubin: 0.3 mg/dL (ref 0.3–1.2)
Total Protein: 7.6 g/dL (ref 6.0–8.3)

## 2010-11-21 LAB — DIFFERENTIAL
Basophils Absolute: 0 10*3/uL (ref 0.0–0.1)
Basophils Relative: 0 % (ref 0–1)
Eosinophils Absolute: 0.1 10*3/uL (ref 0.0–0.7)
Eosinophils Relative: 1 % (ref 0–5)
Lymphocytes Relative: 40 % (ref 12–46)
Lymphs Abs: 2.4 10*3/uL (ref 0.7–4.0)
Monocytes Absolute: 0.5 10*3/uL (ref 0.1–1.0)
Monocytes Relative: 8 % (ref 3–12)
Neutro Abs: 3 10*3/uL (ref 1.7–7.7)
Neutrophils Relative %: 50 % (ref 43–77)

## 2010-11-21 LAB — URINALYSIS, ROUTINE W REFLEX MICROSCOPIC
Bilirubin Urine: NEGATIVE
Glucose, UA: NEGATIVE mg/dL
Ketones, ur: NEGATIVE mg/dL
Leukocytes, UA: NEGATIVE
Nitrite: NEGATIVE
Protein, ur: NEGATIVE mg/dL
Specific Gravity, Urine: 1.025 (ref 1.005–1.030)
Urobilinogen, UA: 0.2 mg/dL (ref 0.0–1.0)
pH: 6 (ref 5.0–8.0)

## 2010-11-21 LAB — URINE MICROSCOPIC-ADD ON

## 2010-11-21 LAB — LIPASE, BLOOD: Lipase: 19 U/L (ref 11–59)

## 2010-11-21 LAB — CBC
HCT: 36.4 % (ref 36.0–46.0)
Hemoglobin: 12.3 g/dL (ref 12.0–15.0)
MCH: 29 pg (ref 26.0–34.0)
MCHC: 33.8 g/dL (ref 30.0–36.0)
MCV: 85.8 fL (ref 78.0–100.0)
Platelets: 293 10*3/uL (ref 150–400)
RBC: 4.24 MIL/uL (ref 3.87–5.11)
RDW: 13.2 % (ref 11.5–15.5)
WBC: 6 10*3/uL (ref 4.0–10.5)

## 2010-11-22 LAB — URINE CULTURE
Colony Count: NO GROWTH
Culture: NO GROWTH

## 2010-12-04 LAB — BASIC METABOLIC PANEL
BUN: 14
Calcium: 9.9
Creatinine, Ser: 0.76
GFR calc non Af Amer: >60
Glucose, Bld: 104 — ABNORMAL HIGH
Sodium: 139

## 2010-12-04 LAB — CBC
Hemoglobin: 13.5
Platelets: 380
RDW: 13.3

## 2010-12-05 LAB — CBC
HCT: 27.7 — ABNORMAL LOW
Hemoglobin: 12.4
Hemoglobin: 9.9 — ABNORMAL LOW
MCHC: 35.7
RBC: 3.15 — ABNORMAL LOW
RBC: 4.03
RDW: 13.4
WBC: 6.3

## 2010-12-05 LAB — BASIC METABOLIC PANEL
CO2: 27
Chloride: 100
Glucose, Bld: 93
Potassium: 3.5
Sodium: 136

## 2010-12-07 LAB — POCT URINALYSIS DIP (DEVICE)
Bilirubin Urine: NEGATIVE
Nitrite: NEGATIVE
Specific Gravity, Urine: 1.015
Urobilinogen, UA: 0.2
pH: 5.5

## 2011-03-12 ENCOUNTER — Encounter (HOSPITAL_COMMUNITY): Payer: Self-pay

## 2011-03-12 ENCOUNTER — Emergency Department (INDEPENDENT_AMBULATORY_CARE_PROVIDER_SITE_OTHER)
Admission: EM | Admit: 2011-03-12 | Discharge: 2011-03-12 | Disposition: A | Discharge status: Home / Self Care | Payer: Federal, State, Local not specified - PPO | Source: Home / Self Care | Attending: Emergency Medicine | Admitting: Emergency Medicine

## 2011-03-12 DIAGNOSIS — R059 Cough, unspecified: Secondary | ICD-10-CM

## 2011-03-12 DIAGNOSIS — R05 Cough: Secondary | ICD-10-CM

## 2011-03-12 HISTORY — DX: Pure hypercholesterolemia, unspecified: E78.00

## 2011-03-12 MED ORDER — PREDNISONE 10 MG PO TABS: 20.0000 mg | ORAL_TABLET | Freq: Two times a day (BID) | ORAL | Status: DC

## 2011-03-12 MED ORDER — AZITHROMYCIN 250 MG PO TABS: 250.0000 mg | ORAL_TABLET | Freq: Every day | ORAL | Status: AC

## 2011-03-12 NOTE — ED Provider Notes (Signed)
History     CSN: [id number]  Arrival date & time 03/12/11  0815   First MD Initiated Contact with Patient 03/12/11 [phone]      Chief Complaint  Patient presents with  . Cough    (Consider location/radiation/quality/duration/timing/severity/associated sxs/prior treatment) HPI Comments: Hurts to cough, been coughing for a bit more than 2 weeks now, see some phlegm, The cough is not getting better, the upper congestion and sinus pressure resolved with Norel CS, prescribed by my doctor, No SOB, have not ahd a fever in the last week, did have some last week.  Patient is a 50 y.o. female presenting with cough. The history is provided by the patient.  Cough The current episode started more than 1 week ago. The problem occurs constantly. The problem has not changed since onset.The cough is productive of sputum. Pertinent negatives include no chest pain, no chills, no sweats, no headaches, no rhinorrhea, no shortness of breath and no wheezing. She has tried cough syrup and decongestants for the symptoms. The treatment provided no relief. Her past medical history does not include COPD or asthma.    Past Medical History  Diagnosis Date  . Hypertension     about 10 years, well controlled with meds  . Hypothyroidism     about 5 years on oral meds  . 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 High cholesterol     Past Surgical History  Procedure Date  . Breast surgery     had 5 or 6 cyst removed from breast, starting 1979  . Abdominal hysterectomy     2004  . Back surgery     2009 dicsectomy    No family history on file.  History  Substance Use Topics  . Smoking status: Never Smoker   . Smokeless tobacco: Not on file  . Alcohol Use: No    OB History    Grav Para Term Preterm Abortions TAB SAB Ect Mult Living                  Review of Systems  Constitutional: Negative for chills.  HENT: Negative for rhinorrhea.   Respiratory: Positive for cough. Negative for shortness  of breath and wheezing.   Cardiovascular: Negative for chest pain.  Neurological: Negative for headaches.    Allergies  Review of patient's allergies indicates no known allergies.  Home Medications   Current Outpatient Rx  Name Route Sig Dispense Refill  . HYDROCHLOROTHIAZIDE 25 MG PO TABS Oral Take 25 mg by mouth daily.      Marland Kitchen PROPRANOLOL HCL 80 MG PO TABS Oral Take 80 mg by mouth 3 (three) times daily.      . AZITHROMYCIN 250 MG PO TABS Oral Take 1 tablet (250 mg total) by mouth daily. Take first 2 tablets together, then 1 every day until finished. 6 tablet 0  . PREDNISONE 10 MG PO TABS Oral Take 2 tablets (20 mg total) by mouth 2 (two) times daily. X 5 days 10 tablet 0    BP 154/78  Pulse 84  Temp(Src) 98.4 F (36.9 C) (Oral)  Resp 16  SpO2 100%  Physical Exam  Nursing note and vitals reviewed. Constitutional: She appears well-developed. No distress.  HENT:  Head: Normocephalic.  Mouth/Throat: Uvula is midline, oropharynx is clear and moist and mucous membranes are normal.  Eyes: Conjunctivae are normal.  Neck: Normal range of motion. No JVD present.  Cardiovascular: Normal rate and regular rhythm.   No murmur heard.  Pulmonary/Chest: Effort normal and breath sounds normal. No respiratory distress. She has no decreased breath sounds. She has no wheezes. She has no rhonchi. She has no rales. She exhibits no tenderness.  Abdominal: Soft.  Lymphadenopathy:    She has no cervical adenopathy.  Neurological: She is alert.  Skin: Skin is warm. She is not diaphoretic.    ED Course  Procedures (including critical care time)  Labs Reviewed - No data to display No results found.   1. Cough       MDM  Cough x 3 weeks. During exam noted with dry cough- Normal lung exam- afebrile- no dyspnea- resolved upper congestion-        Jimmie Molly, MD 03/12/11 1126

## 2011-03-12 NOTE — ED Notes (Signed)
C/o productive cough of whitish-green sputum, chest is painful with cough.  Sx for 2 weeks.  States she has been taking Norel CS and that relieved her sinus congestion but she can't get rid of the cough.

## 2011-03-20 ENCOUNTER — Emergency Department (INDEPENDENT_AMBULATORY_CARE_PROVIDER_SITE_OTHER)
Admission: EM | Admit: 2011-03-20 | Discharge: 2011-03-20 | Disposition: A | Discharge status: Home / Self Care | Payer: Federal, State, Local not specified - PPO | Source: Home / Self Care | Attending: Emergency Medicine | Admitting: Emergency Medicine

## 2011-03-20 ENCOUNTER — Emergency Department (INDEPENDENT_AMBULATORY_CARE_PROVIDER_SITE_OTHER): Payer: Federal, State, Local not specified - PPO

## 2011-03-20 ENCOUNTER — Encounter (HOSPITAL_COMMUNITY): Payer: Self-pay | Admitting: Emergency Medicine

## 2011-03-20 DIAGNOSIS — J45909 Unspecified asthma, uncomplicated: Secondary | ICD-10-CM

## 2011-03-20 MED ORDER — PREDNISONE 10 MG PO TABS: ORAL_TABLET | ORAL | Status: DC

## 2011-03-20 MED ORDER — DOXYCYCLINE HYCLATE 100 MG PO TABS: 100.0000 mg | ORAL_TABLET | Freq: Two times a day (BID) | ORAL | Status: AC

## 2011-03-20 MED ORDER — HYDROCOD POLST-CHLORPHEN POLST 10-8 MG/5ML PO LQCR: 5.0000 mL | Freq: Two times a day (BID) | ORAL | Status: DC | PRN

## 2011-03-20 MED ORDER — ALBUTEROL SULFATE HFA 108 (90 BASE) MCG/ACT IN AERS: 1.0000 | INHALATION_SPRAY | Freq: Four times a day (QID) | RESPIRATORY_TRACT | Status: DC | PRN

## 2011-03-20 NOTE — ED Provider Notes (Signed)
Chief Complaint  Patient presents with  . URI  . Cough    History of Present Illness:  Autumn Green has had a three-week history of a cough. She initially saw her primary care physician at onset of symptoms and was given a cough syrup. She didn't get much better and returned here last week. She was given a prescription for Z-Pak and prednisone. She finished up both but doesn't feel any better. She still describes a cough productive of small amounts of green sputum. No wheezing, chest tightness, or shortness of breath. She had aching in her stomach, back, and chest. She had some fever at onset as well as sore throat and nasal congestion but these have all gone away and her only symptom right now is the cough. She denies any history of asthma or allergies. She does have a history of TB that was treated in the 60s.  Review of Systems:  Other than noted above, the patient denies any of the following symptoms. Systemic:  No fever, chills, sweats, fatigue, myalgias, headache, or anorexia. Eye:  No redness, pain or drainage. ENT:  No earache, nasal congestion, rhinorrhea, sinus pressure, or sore throat. Lungs:  No cough, sputum production, wheezing, shortness of breath. Or chest pain. GI:  No nausea, vomiting, abdominal pain or diarrhea. Skin:  No rash or itching.  PMFSH:  Past medical history, family history, social history, meds, and allergies were reviewed.  Physical Exam:   Vital signs:  BP 148/86  Pulse 74  Temp(Src) 98.1 F (36.7 C) (Oral)  Resp 18  SpO2 100% General:  Alert, in no distress. Eye:  No conjunctival injection or drainage. ENT:  TMs and canals were normal, without erythema or inflammation.  Nasal mucosa was clear and uncongested, without drainage.  Mucous membranes were moist.  Pharynx was clear, without exudate or drainage.  There were no oral ulcerations or lesions. Neck:  Supple, no adenopathy, tenderness or mass. Lungs:  No respiratory distress.  Lungs were clear to  auscultation, without wheezes, rales or rhonchi.  Breath sounds were clear and equal bilaterally. Heart:  Regular rhythm, without gallops, murmers or rubs. Skin:  Clear, warm, and dry, without rash or lesions.  Labs:      Radiology:  Dg Chest 2 View  03/20/2011  *RADIOLOGY REPORT*  Clinical Data: Cough.  CHEST - 2 VIEW  Comparison: 02/16/2010  Findings: Heart and mediastinal contours are within normal limits. No focal opacities or effusions.  No acute bony abnormality.  IMPRESSION: No active cardiopulmonary disease.  Original Report Authenticated By: Cyndie Chime, M.D.    Medications given in [facility]:  None  Assessment:   Diagnoses that have been ruled out:  Diagnoses that are still under consideration:  Final diagnoses:  Reactive airway disease     Plan:   1.  The following meds were prescribed:   New Prescriptions   ALBUTEROL (PROVENTIL HFA;VENTOLIN HFA) 108 (90 BASE) MCG/ACT INHALER    Inhale 1-2 puffs into the lungs every 6 (six) hours as needed for wheezing.   CHLORPHENIRAMINE-HYDROCODONE (TUSSIONEX) 10-8 MG/5ML LQCR    Take 5 mLs by mouth every 12 (twelve) hours as needed.   DOXYCYCLINE (VIBRA-TABS) 100 MG TABLET    Take 1 tablet (100 mg total) by mouth 2 (two) times daily.   PREDNISONE (DELTASONE) 10 MG TABLET    Take 4 tabs daily for 4 days, 3 tabs daily for 4 days, 2 tabs daily for 4 days, then 1 tab daily for 4 days.  Take all tabs at one time with food and preferably in the morning except for the first dose.   2.  The patient was instructed in symptomatic care and handouts were given. 3.  The patient was told to return if becoming worse in any way, if no better in 3 or 4 days, and given some red flag symptoms that would indicate earlier return.   Roque Lias, MD 03/20/11 [phone]

## 2011-03-20 NOTE — ED Notes (Signed)
returns with prod cough with light green sputum,pain in chest from cough and achy that has been ongoing x 3 wks.pt was seen here last week and prescribed Z-PAK,PREDNISONE  BUT STATES IT DIDN'T WORK.lungs clear and denies sob or fevers

## 2011-05-07 ENCOUNTER — Other Ambulatory Visit (HOSPITAL_COMMUNITY): Payer: Federal, State, Local not specified - PPO

## 2011-05-14 ENCOUNTER — Ambulatory Visit (HOSPITAL_COMMUNITY): Admission: RE | Admit: 2011-05-14 | Payer: Worker's Compensation | Source: Ambulatory Visit | Admitting: Neurosurgery

## 2011-05-14 ENCOUNTER — Encounter (HOSPITAL_COMMUNITY): Admission: RE | Payer: Self-pay | Source: Ambulatory Visit

## 2011-05-14 SURGERY — POSTERIOR LUMBAR FUSION 2 LEVEL
Anesthesia: General | Site: Back

## 2011-06-27 ENCOUNTER — Encounter (HOSPITAL_COMMUNITY): Payer: Self-pay | Admitting: Pharmacy Technician

## 2011-07-02 ENCOUNTER — Encounter (HOSPITAL_COMMUNITY): Payer: Self-pay | Admitting: Pharmacy Technician

## 2011-07-05 ENCOUNTER — Other Ambulatory Visit: Payer: Self-pay | Admitting: Neurosurgery

## 2011-07-10 ENCOUNTER — Other Ambulatory Visit (HOSPITAL_COMMUNITY): Payer: Worker's Compensation

## 2011-07-12 ENCOUNTER — Encounter (HOSPITAL_COMMUNITY)
Admission: RE | Admit: 2011-07-12 | Discharge: 2011-07-12 | Disposition: A | Discharge status: Home / Self Care | Source: Ambulatory Visit | Attending: Neurosurgery | Admitting: Neurosurgery

## 2011-07-12 ENCOUNTER — Other Ambulatory Visit: Payer: Self-pay | Admitting: Neurosurgery

## 2011-07-12 ENCOUNTER — Encounter (HOSPITAL_COMMUNITY): Payer: Self-pay

## 2011-07-12 LAB — BASIC METABOLIC PANEL
BUN: 17 mg/dL (ref 6–23)
Chloride: 104 mEq/L (ref 96–112)
Glucose, Bld: 89 mg/dL (ref 70–99)
Potassium: 3.6 mEq/L (ref 3.5–5.1)

## 2011-07-12 LAB — CBC
HCT: 36.5 % (ref 36.0–46.0)
Hemoglobin: 12.7 g/dL (ref 12.0–15.0)
MCH: 30.2 pg (ref 26.0–34.0)
MCHC: 34.8 g/dL (ref 30.0–36.0)
MCV: 86.9 fL (ref 78.0–100.0)

## 2011-07-12 LAB — TYPE AND SCREEN: ABO/RH(D): O POS

## 2011-07-12 NOTE — Pre-Procedure Instructions (Signed)
20 Autumn Green  07/12/2011   Your procedure is scheduled on:  Jul 16, 2011 at 1030 AM  Report to [facility] at 0830 AM.  Call this number if you have problems the morning of surgery: [phone]   Remember:   Do not eat food:After Midnight.  May have clear liquids: up to 4 Hours before arrival.0430 AM  Clear liquids include soda, tea, black coffee, apple or grape juice, broth.  Take these medicines the morning of surgery with A SIP OF WATER: Bring Proventil inhaler, Propranolol(Inderal)   Do not wear jewelry, make-up or nail polish.  Do not wear lotions, powders, or perfumes. You may wear deodorant.  Do not shave 48 hours prior to surgery.  Do not bring valuables to the hospital.  Contacts, dentures or bridgework may not be worn into surgery.  Leave suitcase in the car. After surgery it may be brought to your room.  For patients admitted to the hospital, checkout time is 11:00 AM the day of discharge.   Patients discharged the day of surgery will not be allowed to drive home.    Special Instructions: CHG Shower Use Special Wash: 1/2 bottle night before surgery and 1/2 bottle morning of surgery.   Please read over the following fact sheets that you were given: Pain Booklet, Coughing and Deep Breathing, Blood Transfusion Information, MRSA Information and Surgical Site Infection Prevention

## 2011-07-15 MED ORDER — CEFAZOLIN SODIUM-DEXTROSE 2-3 GM-% IV SOLR
2.0000 g | INTRAVENOUS | Status: AC
  Administered 2011-07-16: 2 g via INTRAVENOUS
  Filled 2011-07-15: qty 50

## 2011-07-15 MED ORDER — DEXAMETHASONE SODIUM PHOSPHATE 10 MG/ML IJ SOLN
10.0000 mg | INTRAMUSCULAR | Status: DC
  Filled 2011-07-15: qty 1

## 2011-07-16 ENCOUNTER — Encounter (HOSPITAL_COMMUNITY): Payer: Self-pay | Admitting: *Deleted

## 2011-07-16 ENCOUNTER — Encounter (HOSPITAL_COMMUNITY)
Admission: RE | Disposition: A | Discharge status: Home / Self Care | Payer: Self-pay | Source: Ambulatory Visit | Attending: Neurosurgery

## 2011-07-16 ENCOUNTER — Encounter (HOSPITAL_COMMUNITY): Payer: Self-pay | Admitting: Anesthesiology

## 2011-07-16 ENCOUNTER — Ambulatory Visit (HOSPITAL_COMMUNITY)

## 2011-07-16 ENCOUNTER — Ambulatory Visit (HOSPITAL_COMMUNITY): Admitting: Anesthesiology

## 2011-07-16 ENCOUNTER — Inpatient Hospital Stay (HOSPITAL_COMMUNITY)
Admission: RE | Admit: 2011-07-16 | Discharge: 2011-07-20 | DRG: 460 | Disposition: A | Discharge status: Home / Self Care | Source: Ambulatory Visit | Attending: Neurosurgery | Admitting: Neurosurgery

## 2011-07-16 DIAGNOSIS — Z90710 Acquired absence of both cervix and uterus: Secondary | ICD-10-CM

## 2011-07-16 DIAGNOSIS — I10 Essential (primary) hypertension: Secondary | ICD-10-CM | POA: Diagnosis present

## 2011-07-16 DIAGNOSIS — M51379 Other intervertebral disc degeneration, lumbosacral region without mention of lumbar back pain or lower extremity pain: Secondary | ICD-10-CM | POA: Diagnosis present

## 2011-07-16 DIAGNOSIS — M47817 Spondylosis without myelopathy or radiculopathy, lumbosacral region: Principal | ICD-10-CM | POA: Diagnosis present

## 2011-07-16 DIAGNOSIS — E039 Hypothyroidism, unspecified: Secondary | ICD-10-CM | POA: Diagnosis present

## 2011-07-16 DIAGNOSIS — M5137 Other intervertebral disc degeneration, lumbosacral region: Secondary | ICD-10-CM | POA: Diagnosis present

## 2011-07-16 DIAGNOSIS — E7800 Pure hypercholesterolemia, unspecified: Secondary | ICD-10-CM | POA: Diagnosis present

## 2011-07-16 DIAGNOSIS — M5126 Other intervertebral disc displacement, lumbar region: Secondary | ICD-10-CM | POA: Diagnosis present

## 2011-07-16 DIAGNOSIS — Z79899 Other long term (current) drug therapy: Secondary | ICD-10-CM

## 2011-07-16 HISTORY — DX: Spondylosis without myelopathy or radiculopathy, lumbar region: M47.816

## 2011-07-16 SURGERY — POSTERIOR LUMBAR FUSION 1 LEVEL
Anesthesia: General | Site: Back | Wound class: Clean

## 2011-07-16 MED ORDER — HYDROCHLOROTHIAZIDE 25 MG PO TABS
25.0000 mg | ORAL_TABLET | Freq: Every day | ORAL | Status: DC
  Administered 2011-07-17 – 2011-07-20 (×4): 25 mg via ORAL
  Filled 2011-07-16 (×5): qty 1

## 2011-07-16 MED ORDER — ONDANSETRON HCL 4 MG/2ML IJ SOLN
INTRAMUSCULAR | Status: DC | PRN
  Administered 2011-07-16: 4 mg via INTRAVENOUS

## 2011-07-16 MED ORDER — LACTATED RINGERS IV SOLN
INTRAVENOUS | Status: DC | PRN
  Administered 2011-07-16 (×3): via INTRAVENOUS

## 2011-07-16 MED ORDER — COLESEVELAM HCL 625 MG PO TABS
1875.0000 mg | ORAL_TABLET | Freq: Two times a day (BID) | ORAL | Status: DC
  Administered 2011-07-17 – 2011-07-20 (×7): 1875 mg via ORAL
  Filled 2011-07-16 (×9): qty 3

## 2011-07-16 MED ORDER — LIDOCAINE-EPINEPHRINE 1 %-1:100000 IJ SOLN
INTRAMUSCULAR | Status: DC | PRN
  Administered 2011-07-16: 10 mL

## 2011-07-16 MED ORDER — 0.9 % SODIUM CHLORIDE (POUR BTL) OPTIME
TOPICAL | Status: DC | PRN
  Administered 2011-07-16: 1000 mL

## 2011-07-16 MED ORDER — ONDANSETRON HCL 4 MG/2ML IJ SOLN
INTRAMUSCULAR | Status: AC
  Filled 2011-07-16: qty 2

## 2011-07-16 MED ORDER — EPHEDRINE SULFATE 50 MG/ML IJ SOLN
INTRAMUSCULAR | Status: DC | PRN
  Administered 2011-07-16: 5 mg via INTRAVENOUS
  Administered 2011-07-16: 10 mg via INTRAVENOUS

## 2011-07-16 MED ORDER — MENTHOL 3 MG MT LOZG
1.0000 | LOZENGE | OROMUCOSAL | Status: DC | PRN
  Filled 2011-07-16: qty 9

## 2011-07-16 MED ORDER — HYDROMORPHONE HCL PF 1 MG/ML IJ SOLN
0.5000 mg | INTRAMUSCULAR | Status: DC | PRN
  Administered 2011-07-16 – 2011-07-17 (×2): 1 mg via INTRAVENOUS
  Filled 2011-07-16 (×2): qty 1

## 2011-07-16 MED ORDER — ROCURONIUM BROMIDE 100 MG/10ML IV SOLN
INTRAVENOUS | Status: DC | PRN
  Administered 2011-07-16: 50 mg via INTRAVENOUS

## 2011-07-16 MED ORDER — SODIUM CHLORIDE 0.9 % IJ SOLN
3.0000 mL | INTRAMUSCULAR | Status: DC | PRN
  Administered 2011-07-18: 3 mL via INTRAVENOUS

## 2011-07-16 MED ORDER — ACETAMINOPHEN 325 MG PO TABS
650.0000 mg | ORAL_TABLET | ORAL | Status: DC | PRN
  Administered 2011-07-19: 650 mg via ORAL
  Filled 2011-07-16: qty 2

## 2011-07-16 MED ORDER — VECURONIUM BROMIDE 10 MG IV SOLR
INTRAVENOUS | Status: DC | PRN
  Administered 2011-07-16: 2 mg via INTRAVENOUS
  Administered 2011-07-16 (×2): 1 mg via INTRAVENOUS

## 2011-07-16 MED ORDER — HYDROMORPHONE HCL PF 1 MG/ML IJ SOLN
0.2500 mg | INTRAMUSCULAR | Status: DC | PRN
  Administered 2011-07-16 (×2): 0.25 mg via INTRAVENOUS

## 2011-07-16 MED ORDER — COLESEVELAM HCL 3.75 G PO PACK: 3.7500 g | PACK | Freq: Every day | ORAL | Status: DC

## 2011-07-16 MED ORDER — DOCUSATE SODIUM 100 MG PO CAPS
100.0000 mg | ORAL_CAPSULE | Freq: Two times a day (BID) | ORAL | Status: DC
  Administered 2011-07-16 – 2011-07-20 (×8): 100 mg via ORAL
  Filled 2011-07-16 (×7): qty 1

## 2011-07-16 MED ORDER — HETASTARCH-ELECTROLYTES 6 % IV SOLN
INTRAVENOUS | Status: DC | PRN
  Administered 2011-07-16: 12:00:00 via INTRAVENOUS

## 2011-07-16 MED ORDER — DEXTROSE 5 % IV SOLN
INTRAVENOUS | Status: DC | PRN
  Administered 2011-07-16: 11:00:00 via INTRAVENOUS

## 2011-07-16 MED ORDER — LIDOCAINE HCL 4 % MT SOLN
OROMUCOSAL | Status: DC | PRN
  Administered 2011-07-16: 4 mL via TOPICAL

## 2011-07-16 MED ORDER — CYCLOBENZAPRINE HCL 10 MG PO TABS
10.0000 mg | ORAL_TABLET | Freq: Three times a day (TID) | ORAL | Status: DC | PRN
  Administered 2011-07-17 – 2011-07-18 (×2): 10 mg via ORAL
  Filled 2011-07-16 (×2): qty 1

## 2011-07-16 MED ORDER — NEOSTIGMINE METHYLSULFATE 1 MG/ML IJ SOLN
INTRAMUSCULAR | Status: DC | PRN
  Administered 2011-07-16: 4 mg via INTRAVENOUS

## 2011-07-16 MED ORDER — ADULT MULTIVITAMIN W/MINERALS CH
1.0000 | ORAL_TABLET | Freq: Every day | ORAL | Status: DC
  Administered 2011-07-17 – 2011-07-20 (×4): 1 via ORAL
  Filled 2011-07-16 (×5): qty 1

## 2011-07-16 MED ORDER — SODIUM CHLORIDE 0.9 % IV SOLN
INTRAVENOUS | Status: DC | PRN
  Administered 2011-07-16: 14:00:00 via INTRAVENOUS

## 2011-07-16 MED ORDER — MIDAZOLAM HCL 5 MG/5ML IJ SOLN
INTRAMUSCULAR | Status: DC | PRN
  Administered 2011-07-16: 2 mg via INTRAVENOUS

## 2011-07-16 MED ORDER — GLYCOPYRROLATE 0.2 MG/ML IJ SOLN
INTRAMUSCULAR | Status: DC | PRN
  Administered 2011-07-16: .5 mg via INTRAVENOUS

## 2011-07-16 MED ORDER — ONDANSETRON HCL 4 MG/2ML IJ SOLN
4.0000 mg | INTRAMUSCULAR | Status: DC | PRN
  Administered 2011-07-16 – 2011-07-17 (×2): 4 mg via INTRAVENOUS
  Filled 2011-07-16 (×2): qty 2

## 2011-07-16 MED ORDER — FENTANYL CITRATE 0.05 MG/ML IJ SOLN
INTRAMUSCULAR | Status: DC | PRN
  Administered 2011-07-16: 100 ug via INTRAVENOUS
  Administered 2011-07-16: 50 ug via INTRAVENOUS
  Administered 2011-07-16: 100 ug via INTRAVENOUS

## 2011-07-16 MED ORDER — SODIUM CHLORIDE 0.9 % IV SOLN
INTRAVENOUS | Status: AC
  Filled 2011-07-16: qty 500

## 2011-07-16 MED ORDER — TRAZODONE HCL 50 MG PO TABS
50.0000 mg | ORAL_TABLET | Freq: Every day | ORAL | Status: DC
  Administered 2011-07-16: 50 mg via ORAL
  Filled 2011-07-16 (×5): qty 1

## 2011-07-16 MED ORDER — BACITRACIN 50000 UNITS IM SOLR
INTRAMUSCULAR | Status: AC
  Filled 2011-07-16: qty 1

## 2011-07-16 MED ORDER — PROPOFOL 10 MG/ML IV EMUL
INTRAVENOUS | Status: DC | PRN
  Administered 2011-07-16: 180 mg via INTRAVENOUS

## 2011-07-16 MED ORDER — CEFAZOLIN SODIUM 1-5 GM-% IV SOLN
1.0000 g | Freq: Three times a day (TID) | INTRAVENOUS | Status: AC
  Administered 2011-07-16 – 2011-07-17 (×2): 1 g via INTRAVENOUS
  Filled 2011-07-16 (×2): qty 50

## 2011-07-16 MED ORDER — ALBUTEROL SULFATE HFA 108 (90 BASE) MCG/ACT IN AERS: 1.0000 | INHALATION_SPRAY | Freq: Four times a day (QID) | RESPIRATORY_TRACT | Status: DC | PRN

## 2011-07-16 MED ORDER — BACITRACIN 50000 UNITS IM SOLR
INTRAMUSCULAR | Status: DC | PRN
  Administered 2011-07-16: 12:00:00

## 2011-07-16 MED ORDER — THROMBIN 20000 UNITS EX KIT
PACK | CUTANEOUS | Status: DC | PRN
  Administered 2011-07-16: 12:00:00 via TOPICAL

## 2011-07-16 MED ORDER — SODIUM CHLORIDE 0.9 % IJ SOLN
3.0000 mL | Freq: Two times a day (BID) | INTRAMUSCULAR | Status: DC
  Administered 2011-07-16 – 2011-07-20 (×7): 3 mL via INTRAVENOUS

## 2011-07-16 MED ORDER — ALUM & MAG HYDROXIDE-SIMETH 200-200-20 MG/5ML PO SUSP: 30.0000 mL | Freq: Four times a day (QID) | ORAL | Status: DC | PRN

## 2011-07-16 MED ORDER — ACETAMINOPHEN 650 MG RE SUPP: 650.0000 mg | RECTAL | Status: DC | PRN

## 2011-07-16 MED ORDER — BUPIVACAINE HCL (PF) 0.25 % IJ SOLN
INTRAMUSCULAR | Status: DC | PRN
  Administered 2011-07-16: 10 mL

## 2011-07-16 MED ORDER — PHENOL 1.4 % MT LIQD: 1.0000 | OROMUCOSAL | Status: DC | PRN

## 2011-07-16 MED ORDER — HYDROMORPHONE HCL PF 1 MG/ML IJ SOLN
INTRAMUSCULAR | Status: AC
  Filled 2011-07-16: qty 1

## 2011-07-16 MED ORDER — SODIUM CHLORIDE 0.9 % IV SOLN: 250.0000 mL | INTRAVENOUS | Status: DC

## 2011-07-16 MED ORDER — PROPRANOLOL HCL 80 MG PO TABS
80.0000 mg | ORAL_TABLET | Freq: Three times a day (TID) | ORAL | Status: DC
  Administered 2011-07-17 – 2011-07-20 (×5): 80 mg via ORAL
  Filled 2011-07-16 (×14): qty 1

## 2011-07-16 MED ORDER — ONDANSETRON HCL 4 MG/2ML IJ SOLN
4.0000 mg | Freq: Four times a day (QID) | INTRAMUSCULAR | Status: AC | PRN
  Administered 2011-07-16: 4 mg via INTRAVENOUS

## 2011-07-16 MED ORDER — OXYCODONE-ACETAMINOPHEN 5-325 MG PO TABS
1.0000 | ORAL_TABLET | ORAL | Status: DC | PRN
  Administered 2011-07-16 – 2011-07-20 (×15): 2 via ORAL
  Filled 2011-07-16 (×15): qty 2

## 2011-07-16 MED ORDER — LIDOCAINE HCL (CARDIAC) 20 MG/ML IV SOLN
INTRAVENOUS | Status: DC | PRN
  Administered 2011-07-16: 80 mg via INTRAVENOUS

## 2011-07-16 SURGICAL SUPPLY — 74 items
BAG DECANTER FOR FLEXI CONT (MISCELLANEOUS) ×2 IMPLANT
BENZOIN TINCTURE PRP APPL 2/3 (GAUZE/BANDAGES/DRESSINGS) ×2 IMPLANT
BLADE SURG 11 STRL SS (BLADE) ×2 IMPLANT
BLADE SURG ROTATE 9660 (MISCELLANEOUS) IMPLANT
BRUSH SCRUB EZ PLAIN DRY (MISCELLANEOUS) ×2 IMPLANT
BUR MATCHSTICK NEURO 3.0 LAGG (BURR) ×2 IMPLANT
BUR PRECISION FLUTE 6.0 (BURR) ×2 IMPLANT
CANISTER SUCTION 2500CC (MISCELLANEOUS) ×2 IMPLANT
CAP LOCKING REVERE (Cap) ×12 IMPLANT
CLOTH BEACON ORANGE TIMEOUT ST (SAFETY) ×2 IMPLANT
CONT SPEC 4OZ CLIKSEAL STRL BL (MISCELLANEOUS) ×4 IMPLANT
COVER BACK TABLE 24X17X13 BIG (DRAPES) IMPLANT
COVER TABLE BACK 60X90 (DRAPES) ×2 IMPLANT
Crosslink 48-60 ×2 IMPLANT
DECANTER SPIKE VIAL GLASS SM (MISCELLANEOUS) ×2 IMPLANT
DERMABOND ADVANCED (GAUZE/BANDAGES/DRESSINGS) ×1
DERMABOND ADVANCED .7 DNX12 (GAUZE/BANDAGES/DRESSINGS) ×1 IMPLANT
DRAPE C-ARM 42X72 X-RAY (DRAPES) ×4 IMPLANT
DRAPE LAPAROTOMY 100X72X124 (DRAPES) ×2 IMPLANT
DRAPE POUCH INSTRU U-SHP 10X18 (DRAPES) ×2 IMPLANT
DRAPE PROXIMA HALF (DRAPES) IMPLANT
DRAPE SURG 17X23 STRL (DRAPES) ×2 IMPLANT
DRSG OPSITE 4X5.5 SM (GAUZE/BANDAGES/DRESSINGS) ×4 IMPLANT
ELECT BLADE 4.0 EZ CLEAN MEGAD (MISCELLANEOUS) ×2
ELECT REM PT RETURN 9FT ADLT (ELECTROSURGICAL) ×2
ELECTRODE BLDE 4.0 EZ CLN MEGD (MISCELLANEOUS) ×1 IMPLANT
ELECTRODE REM PT RTRN 9FT ADLT (ELECTROSURGICAL) ×1 IMPLANT
EVACUATOR 3/16  PVC DRAIN (DRAIN) ×1
EVACUATOR 3/16 PVC DRAIN (DRAIN) ×1 IMPLANT
GAUZE SPONGE 4X4 16PLY XRAY LF (GAUZE/BANDAGES/DRESSINGS) IMPLANT
GLOVE BIO SURGEON STRL SZ8 (GLOVE) ×4 IMPLANT
GLOVE BIOGEL PI IND STRL 6.5 (GLOVE) ×2 IMPLANT
GLOVE BIOGEL PI IND STRL 7.5 (GLOVE) ×1 IMPLANT
GLOVE BIOGEL PI INDICATOR 6.5 (GLOVE) ×2
GLOVE BIOGEL PI INDICATOR 7.5 (GLOVE) ×1
GLOVE ECLIPSE 7.5 STRL STRAW (GLOVE) ×2 IMPLANT
GLOVE ECLIPSE 8.5 STRL (GLOVE) ×2 IMPLANT
GLOVE EXAM NITRILE LRG STRL (GLOVE) IMPLANT
GLOVE EXAM NITRILE MD LF STRL (GLOVE) ×4 IMPLANT
GLOVE EXAM NITRILE XL STR (GLOVE) IMPLANT
GLOVE EXAM NITRILE XS STR PU (GLOVE) IMPLANT
GLOVE INDICATOR 8.5 STRL (GLOVE) ×4 IMPLANT
GLOVE SURG SS PI 6.5 STRL IVOR (GLOVE) ×6 IMPLANT
GOWN BRE IMP SLV AUR LG STRL (GOWN DISPOSABLE) ×6 IMPLANT
GOWN BRE IMP SLV AUR XL STRL (GOWN DISPOSABLE) ×8 IMPLANT
GOWN STRL REIN 2XL LVL4 (GOWN DISPOSABLE) IMPLANT
KIT BASIN OR (CUSTOM PROCEDURE TRAY) ×2 IMPLANT
KIT INFUSE X SMALL 1.4CC (Orthopedic Implant) ×2 IMPLANT
KIT ROOM TURNOVER OR (KITS) ×2 IMPLANT
MILL MEDIUM DISP (BLADE) ×2 IMPLANT
NEEDLE HYPO 25X1 1.5 SAFETY (NEEDLE) ×2 IMPLANT
NS IRRIG 1000ML POUR BTL (IV SOLUTION) ×2 IMPLANT
PACK LAMINECTOMY NEURO (CUSTOM PROCEDURE TRAY) ×2 IMPLANT
PAD ARMBOARD 7.5X6 YLW CONV (MISCELLANEOUS) ×6 IMPLANT
PUTTY BONE DBX 5CC MIX (Putty) ×2 IMPLANT
ROD CURVED 5.5MMX65MM (Rod) ×2 IMPLANT
ROD CURVED 5.5MMX75MM (Rod) ×2 IMPLANT
SCREW PEDICLE 6.5MMX35MM (Screw) ×4 IMPLANT
SCREW PEDICLE 6.5X40MM (Screw) ×8 IMPLANT
SPACER CALIBER 10X22MM 11-15MM (Spacer) ×4 IMPLANT
SPACER CALIBER 10X26MM 11-15MM (Spacer) ×4 IMPLANT
SPONGE GAUZE 4X4 12PLY (GAUZE/BANDAGES/DRESSINGS) ×2 IMPLANT
SPONGE LAP 4X18 X RAY DECT (DISPOSABLE) IMPLANT
SPONGE SURGIFOAM ABS GEL 100 (HEMOSTASIS) ×2 IMPLANT
STRIP CLOSURE SKIN 1/2X4 (GAUZE/BANDAGES/DRESSINGS) ×2 IMPLANT
SUT VIC AB 0 CT1 18XCR BRD8 (SUTURE) ×2 IMPLANT
SUT VIC AB 0 CT1 8-18 (SUTURE) ×2
SUT VIC AB 2-0 CT1 18 (SUTURE) ×2 IMPLANT
SUT VICRYL 4-0 PS2 18IN ABS (SUTURE) ×2 IMPLANT
SYR 20ML ECCENTRIC (SYRINGE) ×2 IMPLANT
TOWEL OR 17X24 6PK STRL BLUE (TOWEL DISPOSABLE) ×2 IMPLANT
TOWEL OR 17X26 10 PK STRL BLUE (TOWEL DISPOSABLE) ×2 IMPLANT
TRAY FOLEY CATH 14FRSI W/METER (CATHETERS) ×2 IMPLANT
WATER STERILE IRR 1000ML POUR (IV SOLUTION) ×2 IMPLANT

## 2011-07-16 NOTE — Anesthesia Preprocedure Evaluation (Addendum)
Anesthesia Evaluation  Patient identified by MRN, date of birth, ID band  Reviewed: Allergy & Precautions, H&P , NPO status , Patient's Chart, lab work & pertinent test results, reviewed documented beta blocker date and time   Airway Mallampati: I TM Distance: >3 FB Neck ROM: full    Dental  (+) Teeth Intact and Dental Advisory Given   Pulmonary neg pulmonary ROS,          Cardiovascular hypertension, Pt. on home beta blockers and Pt. on medications Rhythm:regular Rate:Normal     Neuro/Psych negative neurological ROS  negative psych ROS   GI/Hepatic negative GI ROS, Neg liver ROS,   Endo/Other  Hypothyroidism   Renal/GU negative Renal ROS  negative genitourinary   Musculoskeletal negative musculoskeletal ROS (+)   Abdominal   Peds  Hematology negative hematology ROS (+)   Anesthesia Other Findings   Reproductive/Obstetrics negative OB ROS                         Anesthesia Physical Anesthesia Plan  ASA: II  Anesthesia Plan: General   Post-op Pain Management:    Induction: Intravenous  Airway Management Planned: Oral ETT  Additional Equipment:   Intra-op Plan:   Post-operative Plan: Extubation in OR  Informed Consent: I have reviewed the patients History and Physical, chart, labs and discussed the procedure including the risks, benefits and alternatives for the proposed anesthesia with the patient or authorized representative who has indicated his/her understanding and acceptance.   Dental advisory given  Plan Discussed with: CRNA, Anesthesiologist and Surgeon  Anesthesia Plan Comments:        Anesthesia Quick Evaluation

## 2011-07-16 NOTE — H&P (Signed)
Autumn Green is an 51 y.o. female.   Chief Complaint: Back and left leg pain HPI: Patient is a 51 year old female who 3 years ago injured her back at work lifting a box she ruptured disc underwent laminectomy discectomy and initially did very well over last several months and years and aggressive worsening back pain and still left greater right leg pain refractory anti-inflammatories physical therapy epidural steroid injections and time she should progressive breakdown and both disc space levels as well as discography confirmed concordant responsible to space levels at L4-5 and L5-S1. Into the patient takes her treatment imaging findings and progression of clinical syndrome I recommended posterior lumbar interbody fusion L4-5 L5-S1 with a respiratory operation with her as well as perioperative course and expectations of outcome of her surgery she understands and agrees to proceed forward.  Past Medical History  Diagnosis Date  . Hypertension     about 10 years, well controlled with meds  . High cholesterol   . 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 Hypothyroidism     about 5 years on oral meds    Past Surgical History  Procedure Date  . Breast surgery     had 5 or 6 cyst removed from breast, starting 1979  . Abdominal hysterectomy     2004  . Back surgery     2009 dicsectomy    Family History  Problem Relation Age of Onset  . Hypotension Neg Hx   . Malignant hyperthermia Neg Hx   . Pseudochol deficiency Neg Hx    Social History:  reports that she has never smoked. She has never used smokeless tobacco. She reports that she does not drink alcohol or use illicit drugs.  Allergies: No Known Allergies  Medications Prior to Admission  Medication Sig Dispense Refill  . Cholecalciferol (VITAMIN D PO) Take 1 tablet by mouth daily.      . Colesevelam HCl (WELCHOL) 3.75 G PACK Take 3.75 g by mouth daily.      . hydrochlorothiazide (HYDRODIURIL) 25 MG tablet Take 25 mg by mouth  daily.        . LUTEIN PO Take 1 tablet by mouth daily.      . Multiple Vitamin (MULITIVITAMIN WITH MINERALS) TABS Take 1 tablet by mouth daily.      . propranolol (INDERAL) 80 MG tablet Take 80 mg by mouth 3 (three) times daily.       . traZODone (DESYREL) 50 MG tablet Take 50 mg by mouth at bedtime.      Marland Kitchen VITAMIN E PO Take 1 capsule by mouth daily.      Marland Kitchen albuterol (PROVENTIL HFA;VENTOLIN HFA) 108 (90 BASE) MCG/ACT inhaler Inhale 1-2 puffs into the lungs every 6 (six) hours as needed. For shortness of breath/wheezing        No results found for this or any previous visit (from the past 48 hour(s)). No results found.  Review of Systems  Constitutional: Negative.   HENT: Negative.   Eyes: Negative.   Respiratory: Negative.   Cardiovascular: Negative.   Gastrointestinal: Negative.   Musculoskeletal: Positive for myalgias and back pain.  Skin: Negative.   Neurological: Positive for sensory change.    Blood pressure 118/65, pulse 57, temperature 97.9 F (36.6 C), temperature source Oral, resp. rate 18, SpO2 95.00%. Physical Exam  Constitutional: She is oriented to person, place, and time. She appears well-developed and well-nourished.  Eyes: Pupils are equal, round, and reactive to light.  Neck: Normal  range of motion.  Respiratory: Breath sounds normal.  GI: Soft.  Neurological: She is alert and oriented to person, place, and time. She has normal strength. GCS eye subscore is 4. GCS verbal subscore is 5. GCS motor subscore is 6.  Reflex Scores:      Patellar reflexes are 0 on the right side and 0 on the left side.      Achilles reflexes are 0 on the right side and 0 on the left side.      Patient is 5 out of 5 strength in her iliopsoas, quads, and she's, gastrocs, EHL.     Assessment/Plan 51 year old female presents for posterior lumbar in by fusion L4-5 L5-S1.  Dashay Giesler P 07/16/2011, 10:39 AM

## 2011-07-16 NOTE — Preoperative (Signed)
Beta Blockers   Reason not to administer Beta Blockers:Not Applicable, Taken at 0700 this am per patient

## 2011-07-16 NOTE — Anesthesia Procedure Notes (Signed)
Procedure Name: Intubation Date/Time: 07/16/2011 11:04 AM Performed by: Keyshon Stein S Pre-anesthesia Checklist: Patient identified, Emergency Drugs available, Suction available, Patient being monitored and Timeout performed Patient Re-evaluated:Patient Re-evaluated prior to inductionOxygen Delivery Method: Circle system utilized Preoxygenation: Pre-oxygenation with 100% oxygen Intubation Type: IV induction Ventilation: Mask ventilation without difficulty Laryngoscope Size: Mac and 3 Grade View: Grade I Tube type: Oral Tube size: 7.5 mm Number of attempts: 1 Airway Equipment and Method: Stylet Placement Confirmation: ETT inserted through vocal cords under direct vision,  positive ETCO2 and breath sounds checked- equal and bilateral Secured at: 22 cm Tube secured with: Tape Dental Injury: Teeth and Oropharynx as per pre-operative assessment

## 2011-07-16 NOTE — Op Note (Signed)
Preoperative diagnosis: Lumbar spondylosis with herniated nucleus pulposus L4-5 degenerative disc disease L5-S1 with lumbar instability L4-5  Postoperative diagnosis: Same  Procedure: Redo decompressive lumbar limiting L4-5 and it was in excess will need to be needed with a standard interbody fusion compressive lumbar laminotomy L5-S1 and excess will be needed with a standard interbody fusion posterior lumbar interbody fusion L4-5 L5-S1 using the globus caliber expandable peek cages with local autograft mixed with BMP and DBX pedicle screw fixation L4-S1 using the globus Revere 5.5 pedicle to system posterior lateral arthrodesis L4-S1 using BMP local autograft mixed DBX.  Surgeon: Donalee Citrin  Assistant: Sherilyn Cooter pool  Anesthesia: Gen.  EBL: Less than 500  History of present illness : Patient is a 51 year old same presents with progressive worsening back and right greater left leg pain that's been refractory to all forms of conservative treatment after a work-related injury when she was lifting a box a ruptured disc underwent a laminectomy discectomy couple years ago she showed progressive deterioration decline of disc space at L4-5 L5-S1 with a recurrent herniated disc L4-5 this takes her treatment imaging findings and clinical exam patient recommended L4-5 L5-S1 fusion with odorous metastatic breast with her she understands and agrees to proceed forward.  Operative procedure: The patient was brought into the or was induced under general anesthesia and positioned prone the Wilson frame her back was prepped and draped in sterile fashion. Her old incision was opened up and extended cephalad caudally the scar tissues dissected free and subperiosteal dissections care lamina of L4-L5 and S1 exposing TPs all 3 levels bilaterally. Interoperative X. identify the L4 TPs and then the spinous process of L4 and L5 were removed central decompression was begun. Complete medial facetectomies were performed on the  patient's left side decompressed the left side as well as work into the scar tissue the right side was also decompressed the excess amount of scar overlying the L5 nerve root this is all teased away and L5 nerve is dissected off of the L5 pedicle. After all the foraminotomies been completed L4-L5 and S1 bilaterally the spaces were then identified and epidural veins are coagulated. At this point attention to to placement using a high-speed drill a pilot hole was drilled L4 and the right cannulated with the awl, probed with a with a pedicle probe, tapped with a 55, and a 6 5 x 40 pedicle screws placed at L4 on the right the prostheses avoid confirmed no mediolateral breech in good position. The radial 5 screws are placed a similar fashion was 6 x 35 and S1. Then dissecting interbody work the spaces were incised on the right L5-S1 as well as on the right L4-5 and #10 distractor inserted sequentially sig apposition the endplates of of the appropriate sizing for the graft material Cindy spaces cleanout the left using rotating cutters Epstein curettes after adequate endplate preparation been achieved 211 lordotic I. 20 to at L5-S1 11 lordotic by 26 mm expander cages were inserted sequentially L4-5 and L5-S1 then expanded proximally up to 13 mm with 3 turns. Fluoroscopy confirmed good opening of the disc space good position of the implants the distractors removed sequentially and the right disc spaces cleanout a similar fashion central disc was removed a large recurrence was teased off of the scar tissue from the undersurface of the 4 and 5 roots on the right at this achieved disc spaces decompressed and the thecal sac was decompressed. Local are graft mixed DBX and BMP was packed anteriorly and medially and  then the expander cages were packed in the right expanded the same level. Posterior fluoroscopy confirmed good position of the implants the then the wound scope was irrigated meticulous in a stasis was maintained the  foraminal reinspected aggressive decortication was care MTPs or lateral gutters or in the local graft pack posterior laterally along with BMP. Then 2 rods were placed top tightness tightened at S1 the L5 screws compressed against S1 the L4 compressing his L5 the. The foraminal reinspected across it was in place him and Gelfoam was overlaid top of the dura the muscle fascia proximal in layers with Vicryl after a large abductor was placed and the wounds closed in layers with a running 4 subcuticular benzoin shift applied patient recovered in stable condition at the end of case on it counts sponge counts were correct.

## 2011-07-16 NOTE — Transfer of Care (Signed)
Immediate Anesthesia Transfer of Care Note  Patient: Autumn Green  Procedure(s) Performed: Procedure(s) (LRB): POSTERIOR LUMBAR FUSION 1 LEVEL (N/A)  Patient Location: PACU  Anesthesia Type: General  Level of Consciousness: awake, alert , oriented and patient cooperative  Airway & Oxygen Therapy: Patient Spontanous Breathing and Patient connected to nasal cannula oxygen  Post-op Assessment: Report given to PACU RN, Post -op Vital signs reviewed and stable and Patient moving all extremities X 4  Post vital signs: Reviewed and stable  Complications: No apparent anesthesia complications

## 2011-07-17 ENCOUNTER — Encounter (HOSPITAL_COMMUNITY): Payer: Self-pay | Admitting: General Practice

## 2011-07-17 HISTORY — PX: LUMBAR DISC SURGERY: SHX700

## 2011-07-17 NOTE — Progress Notes (Signed)
UR COMPLETED  

## 2011-07-17 NOTE — Progress Notes (Signed)
Patient did walk to the door early morning with a brace and walker. Tolerated well.

## 2011-07-17 NOTE — Evaluation (Signed)
Physical Therapy Evaluation Patient Details Name: Autumn Green MRN: [id number] DOB: [birth year] Today's Date: 07/17/2011 Time: 7829-5621 PT Time Calculation (min): 25 min  PT Assessment / Plan / Recommendation Clinical Impression  Pt is 51 y/o female admitted for  Redo decompressive lumbar limiting L4-5 and it was in excess will need to be needed with a standard interbody fusion compressive lumbar laminotomy L5-S1 and excess will be needed with a standard interbody fusion posterior lumbar interbody fusion L4-5 L5-S1.  Pt will benefit from acute PT services to improve overall mobility, balance and ambulation to prepare for safe d/c home.    PT Assessment  Patient needs continued PT services    Follow Up Recommendations  Home health PT;Supervision/Assistance - 24 hour (vs NONE)    Equipment Recommendations  Rolling walker with 5" wheels;3 in 1 bedside comode    Frequency Min 5X/week    Precautions / Restrictions Precautions Precautions: Back Precaution Booklet Issued: Yes (comment) Required Braces or Orthoses: Spinal Brace Spinal Brace: Lumbar corset;Applied in sitting position   Pertinent Vitals/Pain 5/10 increase to 8/10 with ambulation.  RN notified for pain meds.       Mobility  Bed Mobility Bed Mobility: Rolling Right;Right Sidelying to Sit Rolling Right: 4: Min guard Right Sidelying to Sit: 4: Min assist Details for Bed Mobility Assistance: (A) to elevate shoulders to EOB Transfers Transfers: Sit to Stand;Stand to Sit Sit to Stand: 4: Min guard Stand to Sit: 4: Min guard Details for Transfer Assistance: Minguard for safety with max cues for hand placement Ambulation/Gait Ambulation/Gait Assistance: 4: Min guard Ambulation Distance (Feet): 30 Feet Assistive device: Rolling walker Ambulation/Gait Assistance Details: Limited ambulation due to mm spasm and pain.  Cues for RW placement.  Need intermittent rest breaks Gait Pattern: Decreased trunk rotation Stairs: No    Exercises     PT Goals Acute Rehab PT Goals PT Goal Formulation: With patient Time For Goal Achievement: 07/24/11 Potential to Achieve Goals: Good Pt will Roll Supine to Right Side: with modified independence PT Goal: Rolling Supine to Right Side - Progress: Goal set today Pt will go Supine/Side to Sit: with modified independence PT Goal: Supine/Side to Sit - Progress: Goal set today Pt will Sit at Edge of Bed: with modified independence;1-2 min PT Goal: Sit at [place] Of Bed - Progress: Goal set today Pt will go Sit to Supine/Side: with modified independence PT Goal: Sit to Supine/Side - Progress: Goal set today Pt will go Sit to Stand: with modified independence PT Goal: Sit to Stand - Progress: Goal set today Pt will go Stand to Sit: with modified independence PT Goal: Stand to Sit - Progress: Goal set today Pt will Ambulate: >150 feet;with modified independence;with least restrictive assistive device PT Goal: Ambulate - Progress: Goal set today Pt will Go Up / Down Stairs: 3-5 stairs;with min assist;with least restrictive assistive device PT Goal: Up/Down Stairs - Progress: Goal set today  Visit Information  Last PT Received On: 07/17/11 Assistance Needed: +1    Subjective Data  Subjective: "I walked around the floor this morning." Patient Stated Goal: To be able to walk without pain.   Prior Functioning  Home Living Lives With: Spouse Available Help at Discharge: Family Type of Home: House Home Access: Stairs to enter Secretary/administrator of Steps: 4 Entrance Stairs-Rails: None Home Layout: One level Bathroom Shower/Tub: Walk-in Contractor: Standard Bathroom Accessibility: Yes How Accessible: Accessible via walker Home Adaptive Equipment: Hand-held shower hose Prior Function Level of Independence: Independent  Able to Take Stairs?: Yes Driving: Yes Vocation: Full time employment Communication Communication: No difficulties    Cognition   Overall Cognitive Status: Appears within functional limits for tasks assessed/performed Arousal/Alertness: Awake/alert Orientation Level: Appears intact for tasks assessed Behavior During Session: [facility] for tasks performed    Extremity/Trunk Assessment Right Lower Extremity Assessment RLE ROM/Strength/Tone: Within functional levels RLE Sensation: WFL - Light Touch Left Lower Extremity Assessment LLE ROM/Strength/Tone: Within functional levels LLE Sensation: WFL - Light Touch Trunk Assessment Trunk Assessment: Normal   Balance Balance Balance Assessed: Yes Static Sitting Balance Static Sitting - Balance Support: Feet supported Static Sitting - Level of Assistance: 5: Stand by assistance Static Sitting - Comment/# of Minutes: ~5 minutes to donn brace with max (A).  End of Session PT - End of Session Equipment Utilized During Treatment: Gait belt;Back brace Activity Tolerance: Patient tolerated treatment well Patient left: in chair;with call bell/phone within reach Nurse Communication: Mobility status   Autumn Green 07/17/2011, 3:48 PM Jake Shark, PT DPT [phone]

## 2011-07-17 NOTE — Evaluation (Signed)
Occupational Therapy Evaluation Patient Details Name: Autumn Green MRN: [id number] DOB: 11/13/1960 Today's Date: 07/17/2011 Time: 4098-1191 OT Time Calculation (min): 23 min  OT Assessment / Plan / Recommendation Clinical Impression  This 51 y.o. female admitted for redo PLIF.  Pt. demonstrates the below listed deficits and will benefit from OT to maximize safety and independence with BADLs to allow pt. to return home with family and min A to supervision level    OT Assessment  Patient needs continued OT Services    Follow Up Recommendations  No OT follow up;Supervision/Assistance - 24 hour    Equipment Recommendations  Rolling walker with 5" wheels;3 in 1 bedside comode    Frequency Min 2X/week    Precautions / Restrictions Precautions Precautions: Back Precaution Booklet Issued: Yes (comment) Required Braces or Orthoses: Spinal Brace Spinal Brace: Lumbar corset;Applied in sitting position Restrictions Weight Bearing Restrictions: No       ADL  Eating/Feeding: Performed;Independent Where Assessed - Eating/Feeding: Bed level Grooming: Simulated;Wash/dry hands;Wash/dry face;Supervision/safety Where Assessed - Grooming: Unsupported sitting Upper Body Bathing: Simulated;Minimal assistance Where Assessed - Upper Body Bathing: Sitting, bed Lower Body Bathing: Simulated;Maximal assistance Where Assessed - Lower Body Bathing: Sit to stand from bed Upper Body Dressing: Simulated;Minimal assistance Where Assessed - Upper Body Dressing: Sitting, bed Lower Body Dressing: Simulated;+1 Total assistance Where Assessed - Lower Body Dressing: Sit to stand from bed Toilet Transfer: Simulated;Minimal assistance Toilet Transfer Method: Stand pivot Toilet Transfer Equipment: Bedside commode Toileting - Clothing Manipulation: Simulated;Minimal assistance Where Assessed - Glass blower/designer Manipulation: Standing Toileting - Hygiene: Simulated;Supervision/safety (front peri area) Where  Assessed - Toileting Hygiene: Sit on 3-in-1 or toilet Equipment Used: Back brace;Rolling walker ADL Comments: Pt. able to state 2/3 precautions.  Pt. unable to cross ankles over knees to access feet for LB ADLs this date.  She states this is the method she used  PTA.     OT Goals Acute Rehab OT Goals OT Goal Formulation: With patient Time For Goal Achievement: 07/24/11 Potential to Achieve Goals: Good ADL Goals Pt Will Perform Grooming: with supervision;Sitting at sink ADL Goal: Grooming - Progress: Goal set today Pt Will Perform Lower Body Bathing: with supervision;Sit to stand from chair;Sit to stand from bed (with AE) ADL Goal: Lower Body Bathing - Progress: Goal set today Pt Will Perform Lower Body Dressing: with supervision;Sit to stand from chair;Sit to stand from bed;with adaptive equipment ADL Goal: Lower Body Dressing - Progress: Goal set today Pt Will Transfer to Toilet: with supervision;Ambulation;3-in-1 ADL Goal: Toilet Transfer - Progress: Goal set today Pt Will Perform Toileting - Clothing Manipulation: with supervision;Standing ADL Goal: Toileting - Clothing Manipulation - Progress: Goal set today Pt Will Perform Toileting - Hygiene: with supervision;with adaptive equipment (PRN) ADL Goal: Toileting - Hygiene - Progress: Goal set today Pt Will Perform Tub/Shower Transfer: Shower transfer;with supervision;Ambulation;with DME ADL Goal: Tub/Shower Transfer - Progress: Goal set today  Visit Information  Last OT Received On: 07/17/11 Assistance Needed: +1    Subjective Data  Subjective: "Oh, it hurts down into my hip" Patient Stated Goal: To get better   Prior Functioning  Home Living Lives With: Spouse Available Help at Discharge: Family;Available 24 hours/day Type of Home: House Home Access: Stairs to enter [employer] of Steps: 4 Entrance Stairs-Rails: None Home Layout: One level Bathroom Shower/Tub: Walk-in Contractor:  Standard Bathroom Accessibility: Yes How Accessible: Accessible via walker Home Adaptive Equipment: Hand-held shower hose Prior Function Level of Independence: Independent Able to Take Stairs?: Yes  Driving: Yes Vocation: Full time employment Communication Communication: No difficulties Dominant Hand: Right    Cognition  Overall Cognitive Status: Appears within functional limits for tasks assessed/performed Arousal/Alertness: Lethargic Orientation Level: Appears intact for tasks assessed Behavior During Session: Lethargic    Extremity/Trunk Assessment Right Upper Extremity Assessment RUE ROM/Strength/Tone: Within functional levels Left Upper Extremity Assessment LUE ROM/Strength/Tone: Within functional levels   Mobility Bed Mobility Bed Mobility: Rolling Right;Right Sidelying to Sit Rolling Right: 4: Min guard Right Sidelying to Sit: 4: Min assist Details for Bed Mobility Assistance: (A) to elevate shoulders to EOB Transfers Transfers: Sit to Stand;Stand to Sit Sit to Stand: 4: Min guard;With upper extremity assist;From bed Stand to Sit: 4: Min guard;With upper extremity assist;To bed Details for Transfer Assistance: requires cues for hand placement   Exercise    Balance Balance Balance Assessed: Yes Static Sitting Balance Static Sitting - Balance Support: Feet supported Static Sitting - Level of Assistance: 5: Stand by assistance Static Sitting - Comment/# of Minutes: ~5 minutes to donn brace with max (A).  End of Session OT - End of Session Equipment Utilized During Treatment: Back brace Activity Tolerance: Patient limited by fatigue;Patient limited by pain Patient left: in bed;with call bell/phone within reach;with family/visitor present   Sapir Lavey, Ursula Alert M 07/17/2011, 3:57 PM

## 2011-07-17 NOTE — Progress Notes (Signed)
Subjective: Patient reports That she's convalescing well sole of back pain leg pain improved  Objective: Vital signs in last 24 hours: Temp:  [97.2 F (36.2 C)-98.2 F (36.8 C)] 98.2 F (36.8 C) (05/07 0651) Pulse Rate:  [55-73] 59  (05/07 0651) Resp:  [18-20] 18  (05/07 0651) BP: (110-140)/(44-90) 140/63 mmHg (05/07 0651) SpO2:  [92 %-100 %] 97 % (05/07 0651)  Intake/Output from previous day: 05/06 0701 - 05/07 0700 In: 3520 [I.V.:2650; Blood:170; IV Piggyback:500] Out: 1555 [Urine:775; Drains:180; Blood:600] Intake/Output this shift:    out of 5 wound clean and dry  Lab Results: No results found for this basename: WBC:2,HGB:2,HCT:2,PLT:2 in the last 72 hours BMET No results found for this basename: NA:2,K:2,CL:2,CO2:2,GLUCOSE:2,BUN:2,CREATININE:2,CALCIUM:2 in the last 72 hours  Studies/Results: Dg Lumbar Spine 2-3 Views  07/16/2011  *RADIOLOGY REPORT*  Clinical Data: 51 year old female undergoing lumbar surgery.  DG C-ARM 61-120 MIN, LUMBAR SPINE - 2-3 VIEW  Comparison: CT lumbar spine 11/08/2010.  Lumbar discogram 11/08/2010.  Fluoroscopy time of 0.9 minutes was utilized.  Findings: The same numbering system is used as on the comparison, designating normal lumbar segmentation.  Intraoperative fluoroscopic views of the lower lumbar spine in the frontal and lateral projection.  Bilateral trans pedicle screws at L4, L5, and S1.  Interbody implant at L4-L5 and L5-S1.  IMPRESSION: L4-L5 and L5-S1 fusion under way.  Original Report Authenticated By: Harley Hallmark, M.D.   Dg C-arm 61-120 Min  07/16/2011  *RADIOLOGY REPORT*  Clinical Data: 50 year old female undergoing lumbar surgery.  DG C-ARM 61-120 MIN, LUMBAR SPINE - 2-3 VIEW  Comparison: CT lumbar spine 11/08/2010.  Lumbar discogram 11/08/2010.  Fluoroscopy time of 0.9 minutes was utilized.  Findings: The same numbering system is used as on the comparison, designating normal lumbar segmentation.  Intraoperative fluoroscopic views of  the lower lumbar spine in the frontal and lateral projection.  Bilateral trans pedicle screws at L4, L5, and S1.  Interbody implant at L4-L5 and L5-S1.  IMPRESSION: L4-L5 and L5-S1 fusion under way.  Original Report Authenticated By: Harley Hallmark, M.D.    Assessment/Plan: Mobilized today with physical therapy  LOS: 1 day     Daxten Kovalenko P 07/17/2011, 8:12 AM

## 2011-07-17 NOTE — Anesthesia Postprocedure Evaluation (Signed)
Anesthesia Post Note  Patient: Autumn Green  Procedure(s) Performed: Procedure(s) (LRB): POSTERIOR LUMBAR FUSION 1 LEVEL (N/A)  Anesthesia type: General  Patient location: PACU  Post pain: Pain level controlled and Adequate analgesia  Post assessment: Post-op Vital signs reviewed, Patient's Cardiovascular Status Stable, Respiratory Function Stable, Patent Airway and Pain level controlled  Last Vitals:  Filed Vitals:   07/17/11 1446  BP: 104/46  Pulse: 60  Temp: 36.7 C  Resp: 18    Post vital signs: Reviewed and stable  Level of consciousness: awake, alert  and oriented  Complications: No apparent anesthesia complications

## 2011-07-18 NOTE — Progress Notes (Signed)
Occupational Therapy Treatment Patient Details Name: Autumn Green MRN: [id number] DOB: Nov 02, 1960 Today's Date: 07/18/2011 Time: 4098-1191 OT Time Calculation (min): 20 min  OT Assessment / Plan / Recommendation Comments on Treatment Session Pt with improved endurance today but continues to experience significant pain during ADLs and functional mobility.    Follow Up Recommendations  No OT follow up;Supervision/Assistance - 24 hour    Equipment Recommendations  Rolling walker with 5" wheels;3 in 1 bedside comode    Frequency Min 2X/week   Plan Discharge plan remains appropriate    Precautions / Restrictions Precautions Precautions: Back Precaution Booklet Issued: Yes (comment) Precaution Comments: Patient able to recall all precaution, requiring cues to maintain with mobility. Patient able to don brace independently Spinal Brace: Lumbar corset;Applied in sitting position   Pertinent Vitals/Pain     ADL  Grooming: Performed;Wash/dry hands;Supervision/safety Where Assessed - Grooming: Standing at [employer] Transfer: Performed;Other (comment) (min guard) Toilet Transfer Method: Proofreader: Raised toilet seat with arms (or 3-in-1 over toilet) Toileting - Clothing Manipulation: Performed;Other (comment) (min guard) Where Assessed - Toileting Clothing Manipulation: Standing Toileting - Hygiene: Performed;Supervision/safety Where Assessed - Toileting Hygiene: Sit on 3-in-1 or toilet Ambulation Related to ADLs: Pt ambulated with min guard assist with RW and increased time due to pain from muscle spasms.    OT Goals ADL Goals Pt Will Perform Grooming: with supervision;Sitting at sink ADL Goal: Grooming - Progress: Met Pt Will Transfer to Toilet: with supervision;Ambulation;3-in-1 ADL Goal: Toilet Transfer - Progress: Progressing toward goals Pt Will Perform Toileting - Clothing Manipulation: with supervision;Standing ADL Goal: Toileting - Clothing  Manipulation - Progress: Progressing toward goals Pt Will Perform Toileting - Hygiene: with supervision;with adaptive equipment ADL Goal: Toileting - Hygiene - Progress: Progressing toward goals  Visit Information  Last OT Received On: 07/18/11 Assistance Needed: +1 PT/OT Co-Evaluation/Treatment: Yes    Subjective Data      Prior Functioning       Cognition  Overall Cognitive Status: Appears within functional limits for tasks assessed/performed Arousal/Alertness: Awake/alert Orientation Level: Appears intact for tasks assessed Behavior During Session: [facility] for tasks performed    Mobility Bed Mobility Rolling Right: 5: Supervision;With rail Right Sidelying to Sit: 5: Supervision;HOB flat;With rails Details for Bed Mobility Assistance: Patient requiring cues to ensure log roll technique. Patient with heavy reliance on rails which she will not have at home. Will need to attempt next session without rails.  Transfers Sit to Stand: 4: Min guard Stand to Sit: Without upper extremity assist Details for Transfer Assistance: Cues for safe hand placement   Exercises    Balance    End of Session OT - End of Session Equipment Utilized During Treatment: Back brace Activity Tolerance: Patient limited by pain Patient left: in chair;with call bell/phone within reach Nurse Communication: Mobility status  07/18/2011 Cipriano Mile OTR/L Pager [phone] Office [phone]  Cipriano Mile 07/18/2011, 11:59 AM

## 2011-07-18 NOTE — Progress Notes (Signed)
Physical Therapy Treatment Patient Details Name: Autumn Green MRN: [id number] DOB: May 22, 1960 Today's Date: 07/18/2011 Time: 4098-1191 PT Time Calculation (min): 20 min  PT Assessment / Plan / Recommendation Comments on Treatment Session  Patient able to increase ambulation, however, limited by spasm in low back. Patient declined stairs today and stated that she would attempt next session    Follow Up Recommendations  Home health PT;Supervision/Assistance - 24 hour    Equipment Recommendations  Rolling walker with 5" wheels;3 in 1 bedside comode    Frequency Min 5X/week   Plan Discharge plan remains appropriate    Precautions / Restrictions Precautions Precautions: Back Precaution Booklet Issued: Yes (comment) Precaution Comments: Patient able to recall all precaution, requiring cues to maintain with mobility. Patient able to don brace independently Spinal Brace: Lumbar corset;Applied in sitting position   Pertinent Vitals/Pain 10/10 muscle spasms in low back, patient premedicated    Mobility  Bed Mobility Rolling Right: 5: Supervision;With rail Right Sidelying to Sit: 5: Supervision;HOB flat;With rails Details for Bed Mobility Assistance: Patient requiring cues to ensure log roll technique. Patient with heavy reliance on rails which she will not have at home. Will need to attempt next session without rails.  Transfers Sit to Stand: 4: Min guard Stand to Sit: Without upper extremity assist Details for Transfer Assistance: Cues for safe hand placement Ambulation/Gait Ambulation/Gait Assistance: 4: Min guard Ambulation Distance (Feet): 100 Feet Assistive device: Rolling walker Ambulation/Gait Assistance Details: Patient with spasms with ambulation. Required imtermittent standing rest breaks.  Gait Pattern: Decreased stride length;Step-through pattern Gait velocity: decreased    Exercises     PT Goals Acute Rehab PT Goals PT Goal: Rolling Supine to Right Side - Progress:  Progressing toward goal PT Goal: Supine/Side to Sit - Progress: Progressing toward goal PT Goal: Sit at Edge Of Bed - Progress: Met PT Goal: Sit to Stand - Progress: Progressing toward goal PT Goal: Stand to Sit - Progress: Progressing toward goal PT Goal: Ambulate - Progress: Progressing toward goal  Visit Information  Last PT Received On: 07/18/11 Assistance Needed: +1    Subjective Data  Subjective: " I walked earlier and I plan on walking again later"   Cognition  Overall Cognitive Status: Appears within functional limits for tasks assessed/performed Arousal/Alertness: Awake/alert Orientation Level: Appears intact for tasks assessed Behavior During Session: [facility] for tasks performed    Balance     End of Session PT - End of Session Equipment Utilized During Treatment: Gait belt;Back brace Activity Tolerance: Patient limited by fatigue;Patient limited by pain Patient left: in chair;with call bell/phone within reach    Fredrich Birks 07/18/2011, 10:24 AM 07/18/2011 Fredrich Birks PTA [phone] pager [phone] office

## 2011-07-18 NOTE — Progress Notes (Signed)
Subjective: Patient reports Overall at night but mostly with back pain her leg still feels good  Objective: Vital signs in last 24 hours: Temp:  [98 F (36.7 C)-100.4 F (38 C)] 98.7 F (37.1 C) (05/08 0600) Pulse Rate:  [60-78] 78  (05/08 0600) Resp:  [17-20] 20  (05/08 0600) BP: (98-130)/(35-64) 130/58 mmHg (05/08 0600) SpO2:  [99 %-100 %] 99 % (05/08 0600) Weight:  [197 lb 15.6 oz (89.8 kg)] 197 lb 15.6 oz (89.8 kg) (05/07 1043)  Intake/Output from previous day: 05/07 0701 - 05/08 0700 In: 840 [P.O.:840] Out: 380 [Drains:380] Intake/Output this shift:    Strength is 5 out of 5 wound is clean and dry  Lab Results: No results found for this basename: WBC:2,HGB:2,HCT:2,PLT:2 in the last 72 hours BMET No results found for this basename: NA:2,K:2,CL:2,CO2:2,GLUCOSE:2,BUN:2,CREATININE:2,CALCIUM:2 in the last 72 hours  Studies/Results: Dg Lumbar Spine 2-3 Views  07/16/2011  *RADIOLOGY REPORT*  Clinical Data: 51 year old female undergoing lumbar surgery.  DG C-ARM 61-120 MIN, LUMBAR SPINE - 2-3 VIEW  Comparison: CT lumbar spine 11/08/2010.  Lumbar discogram 11/08/2010.  Fluoroscopy time of 0.9 minutes was utilized.  Findings: The same numbering system is used as on the comparison, designating normal lumbar segmentation.  Intraoperative fluoroscopic views of the lower lumbar spine in the frontal and lateral projection.  Bilateral trans pedicle screws at L4, L5, and S1.  Interbody implant at L4-L5 and L5-S1.  IMPRESSION: L4-L5 and L5-S1 fusion under way.  Original Report Authenticated By: Harley Hallmark, M.D.   Dg C-arm 61-120 Min  07/16/2011  *RADIOLOGY REPORT*  Clinical Data: 51 year old female undergoing lumbar surgery.  DG C-ARM 61-120 MIN, LUMBAR SPINE - 2-3 VIEW  Comparison: CT lumbar spine 11/08/2010.  Lumbar discogram 11/08/2010.  Fluoroscopy time of 0.9 minutes was utilized.  Findings: The same numbering system is used as on the comparison, designating normal lumbar segmentation.   Intraoperative fluoroscopic views of the lower lumbar spine in the frontal and lateral projection.  Bilateral trans pedicle screws at L4, L5, and S1.  Interbody implant at L4-L5 and L5-S1.  IMPRESSION: L4-L5 and L5-S1 fusion under way.  Original Report Authenticated By: Harley Hallmark, M.D.    Assessment/Plan: Stop day 2 from a plif, continue to mobilize with physical therapy  LOS: 2 days     Trea Latner P 07/18/2011, 7:32 AM

## 2011-07-19 MED ORDER — POLYETHYLENE GLYCOL 3350 17 G PO PACK
17.0000 g | PACK | Freq: Every day | ORAL | Status: DC
  Administered 2011-07-19 – 2011-07-20 (×2): 17 g via ORAL
  Filled 2011-07-19 (×2): qty 1

## 2011-07-19 MED FILL — Sodium Chloride IV Soln 0.9%: INTRAVENOUS | Qty: 1000 | Status: AC

## 2011-07-19 MED FILL — Heparin Sodium (Porcine) Inj 1000 Unit/ML: INTRAMUSCULAR | Qty: 30 | Status: AC

## 2011-07-19 NOTE — Progress Notes (Signed)
Occupational Therapy Treatment Patient Details Name: Autumn Green MRN: [id number] DOB: 02-15-1961 Today's Date: 07/19/2011 Time: 4098-1191 OT Time Calculation (min): 17 min  OT Assessment / Plan / Recommendation Comments on Treatment Session Pt limited by pain during session. Focus of treatment session on AE education.    Follow Up Recommendations  No OT follow up;Supervision/Assistance - 24 hour    Barriers to Discharge       Equipment Recommendations  Rolling walker with 5" wheels;3 in 1 bedside comode    Recommendations for Other Services    Frequency Min 2X/week   Plan Discharge plan remains appropriate    Precautions / Restrictions Precautions Precautions: Back Precaution Comments: Patient able to recall all precautions Required Braces or Orthoses: Spinal Brace Spinal Brace: Lumbar corset;Applied in sitting position Restrictions Weight Bearing Restrictions: No   Pertinent Vitals/Pain N/A    ADL  Lower Body Bathing: Simulated;Supervision/safety Where Assessed - Lower Body Bathing: Sitting, bed Lower Body Dressing: Simulated;Supervision/safety Where Assessed - Lower Body Dressing: Sitting, bed Toilet Transfer: Performed;Supervision/safety Toilet Transfer Method: Ambulating Toilet Transfer Equipment: Raised toilet seat with arms (or 3-in-1 over toilet) Toileting - Clothing Manipulation: Performed;Supervision/safety Where Assessed - Toileting Clothing Manipulation: Standing Equipment Used: Back brace;Long-handled sponge;Rolling walker;Sock aid;Reacher Ambulation Related to ADLs: Pt ambulated to BR with supervision and RW ADL Comments: Educated pt on AE for LB bathing/dressing. Pt able to return demonstration with supervision for technique.    OT Diagnosis:    OT Problem List:   OT Treatment Interventions:     OT Goals ADL Goals Pt Will Perform Lower Body Bathing: with supervision;Sit to stand from chair;Sit to stand from bed ADL Goal: Lower Body Bathing -  Progress: Met Pt Will Perform Lower Body Dressing: with supervision;Sit to stand from chair;Sit to stand from bed;with adaptive equipment ADL Goal: Lower Body Dressing - Progress: Met Pt Will Transfer to Toilet: with supervision;Ambulation;3-in-1 ADL Goal: Toilet Transfer - Progress: Met Pt Will Perform Toileting - Clothing Manipulation: with supervision;Standing ADL Goal: Toileting - Clothing Manipulation - Progress: Met  Visit Information  Last OT Received On: 07/19/11 Assistance Needed: +1    Subjective Data      Prior Functioning       Cognition  Overall Cognitive Status: Appears within functional limits for tasks assessed/performed Arousal/Alertness: Awake/alert Orientation Level: Appears intact for tasks assessed Behavior During Session: [facility] for tasks performed    Mobility Bed Mobility Rolling Right: 5: Supervision Right Sidelying to Sit: 4: Min assist;With rails Details for Bed Mobility Assistance: pt at first with attempt to perform supine to sit.  Pt then cued to perform log roll technique. Assist with trunk OOB due to pain. Transfers Sit to Stand: 5: Supervision;From bed;With upper extremity assist Stand to Sit: 6: Modified independent (Device/Increase time);To chair/3-in-1;With armrests;With upper extremity assist Details for Transfer Assistance: verbal cues for safe hand placement   Exercises    Balance    End of Session OT - End of Session Equipment Utilized During Treatment: Back brace Activity Tolerance: Patient limited by pain Patient left: with call bell/phone within reach;with family/visitor present (in BR on commode) Nurse Communication: Patient requests pain meds (pt in BR on commode)  07/19/2011 Cipriano Mile OTR/L Pager [phone] Office [phone]  Cipriano Mile 07/19/2011, 5:25 PM

## 2011-07-19 NOTE — Progress Notes (Signed)
Physical Therapy Treatment Patient Details Name: Chundra J Belarus MRN: [id number] DOB: [birth year] Today's Date: 07/19/2011 Time: 4098-1191 PT Time Calculation (min): 13 min  PT Assessment / Plan / Recommendation Comments on Treatment Session  Patient continues to have increase pain. THough pain better today patient continue to request holding off stairs due to back spasms. Pt understands that she will have to attempt stairs before discharging home    Follow Up Recommendations  Home health PT;Supervision/Assistance - 24 hour    Equipment Recommendations  Rolling walker with 5" wheels;3 in 1 bedside comode    Frequency Min 5X/week   Plan Discharge plan remains appropriate    Precautions / Restrictions Precautions Precautions: Back Precaution Comments: Patient able to recall all precautions Spinal Brace: Lumbar corset;Applied in sitting position   Pertinent Vitals/Pain     Mobility  Bed Mobility Rolling Right: 6: Modified independent (Device/Increase time);With rail Right Sidelying to Sit: 6: Modified independent (Device/Increase time);HOB flat Transfers Sit to Stand: 6: Modified independent (Device/Increase time) Stand to Sit: 6: Modified independent (Device/Increase time) Ambulation/Gait Ambulation/Gait Assistance: 5: Supervision Ambulation Distance (Feet): 180 Feet Assistive device: Rolling walker Ambulation/Gait Assistance Details: Cues to increase gait speed.  Gait Pattern: Decreased stride length;Step-through pattern    Exercises     PT Goals Acute Rehab PT Goals PT Goal: Rolling Supine to Right Side - Progress: Met PT Goal: Supine/Side to Sit - Progress: Progressing toward goal PT Goal: Sit at Edge Of Bed - Progress: Met PT Goal: Sit to Supine/Side - Progress: Met PT Goal: Sit to Stand - Progress: Met PT Goal: Stand to Sit - Progress: Met PT Goal: Ambulate - Progress: Progressing toward goal  Visit Information  Last PT Received On: 07/19/11 Assistance Needed:  +1    Subjective Data  Subjective: I think this medicine has me really drowsey   Cognition  Overall Cognitive Status: Appears within functional limits for tasks assessed/performed Arousal/Alertness: Awake/alert Orientation Level: Appears intact for tasks assessed Behavior During Session: [facility] for tasks performed    Balance     End of Session PT - End of Session Equipment Utilized During Treatment: Gait belt;Back brace Activity Tolerance: Patient tolerated treatment well Patient left: in chair    Fredrich Birks 07/19/2011, 9:25 AM 07/19/2011 Fredrich Birks PTA [phone] pager [phone] office

## 2011-07-19 NOTE — Progress Notes (Signed)
Subjective: Patient reports She's feeling okay she is ambulating saws low back soreness she is having no leg pain is most concerned in her bowels were now  Objective: Vital signs in last 24 hours: Temp:  [97.6 F (36.4 C)-101 F (38.3 C)] 97.6 F (36.4 C) (05/09 0600) Pulse Rate:  [65-75] 75  (05/09 0600) Resp:  [18] 18  (05/09 0600) BP: (90-127)/(52-74) 127/74 mmHg (05/09 0600) SpO2:  [93 %-99 %] 96 % (05/09 0600)  Intake/Output from previous day: 05/08 0701 - 05/09 0700 In: 1080 [P.O.:1080] Out: 195 [Drains:195] Intake/Output this shift: Total I/O In: -  Out: 75 [Drains:75]  Strength is 5 out of 5 wound is clean and dry  Lab Results: No results found for this basename: WBC:2,HGB:2,HCT:2,PLT:2 in the last 72 hours BMET No results found for this basename: NA:2,K:2,CL:2,CO2:2,GLUCOSE:2,BUN:2,CREATININE:2,CALCIUM:2 in the last 72 hours  Studies/Results: No results found.  Assessment/Plan: Continue to mobilize physical therapy start laxative of choice for her bowels prep discharge probably tomorrow  LOS: 3 days     Tyleek Smick P 07/19/2011, 7:27 AM

## 2011-07-19 NOTE — Care Management Note (Signed)
    Page 1 of 2   07/23/2011     10:17:59 AM   CARE MANAGEMENT NOTE 07/23/2011  Patient:  Autumn Green, Autumn Green   Account Number:  [id number]  Date Initiated:  07/19/2011  Documentation initiated by:  Onnie Boer  Subjective/Objective Assessment:   PT WAS ADMITTED WITH A LUMBAR FRACTURE     Action/Plan:   PROGRESSION OF CARE AND DISCHARGE PLANNING   Anticipated DC Date:  07/22/2011   Anticipated DC Plan:  HOME W HOME HEALTH SERVICES      DC Planning Services  CM consult      Choice offered to / List presented to:  NA   DME arranged  3-N-1  Levan Hurst      DME agency  [facility]        Status of service:  Completed, signed off Medicare Important Message given?   (If response is "NO", the following Medicare IM given date fields will be blank) Date Medicare IM given:   Date Additional Medicare IM given:    Discharge Disposition:  HOME W HOME HEALTH SERVICES  Per UR Regulation:  Reviewed for med. necessity/level of care/duration of stay  If discussed at Long Length of Stay Meetings, dates discussed:    Comments:  07/20/11 Onnie Boer, RN, BSN 1655 PT IS TO BE DC TO HOME TODAY.  AFTER MUCH ATTEMPT TO CONTACT [employer] FOR AN AGENCY APPROVAL I WAS UNSUCCESSFUL.  I WAS ABLE TO GET THE PAPERWORK FOR DR. Lessie Dings OFFICE TO COMPLETE TO RETURN TO [employer].  I WAS INSTRUCTED BY VANESSA AT DR. Lessie Dings OFFICE TO USE [facility]. ONCE [facility] WAS GIVEN THE REFFERAL SHE STATED THAT SHE HAD TO HAVE AN AUTHORIZATION NO.  [facility] WAS WILLING TO PROVIDE DME'S TO THE PT.  DR. Bel[place] DID NOT WRITE THE HH PT ORDER FOR DC. ON MONDAY I WILL F/U WITH [employer] AND DR. Lessie Dings OFFICE TO SEE IF HH PT IS STILL NEEDED.  TO ENSURE A SAFE DC PT HAS BEEN ARRANGED WITH A 3N1 AND A RW FROM [facility].  07/20/11 Onnie Boer, RN, BSN 1228 PT HAS [employer] AND I HAVE LEFT A MESSAGE FOR THE ADJUSTER (DEE KEMP [phone])TO ASK FOR A FAX NO. TO SEND ORDERS TO FOR HH AND DME'S.  WILL F/U LATER  TODAY.  07/19/11 Onnie Boer, RN, BSN 1420 PT WAS ADMITTED WITH A LUMBAR FRACTURE NEEDING SURGERY. PTA PT WAS AT HOME WITH HER HUSBAND.  AT DC PT WILL NEED HH PT/ 3N1/RW FROM [facility].  WILL F/U ON OTHER NEEDS

## 2011-07-20 MED ORDER — OXYCODONE-ACETAMINOPHEN 5-325 MG PO TABS: 1.0000 | ORAL_TABLET | ORAL | Status: AC | PRN

## 2011-07-20 MED ORDER — METHYLPREDNISOLONE 4 MG PO KIT: PACK | ORAL | Status: AC

## 2011-07-20 NOTE — Discharge Instructions (Signed)
No lifting or bending or twisting no driving and no riding a car

## 2011-07-20 NOTE — Progress Notes (Signed)
Physical Therapy Treatment Patient Details Name: Autumn Green MRN: [id number] DOB: 14-Dec-1960 Today's Date: 07/20/2011 Time: 1011-1028 PT Time Calculation (min): 17 min  PT Assessment / Plan / Recommendation Comments on Treatment Session  Patient still very motivated to increase activity. Still with complaints of L LE spasms    Follow Up Recommendations  Home health PT    Barriers to Discharge        Equipment Recommendations  Rolling walker with 5" wheels;3 in 1 bedside comode    Recommendations for Other Services    Frequency Min 5X/week   Plan Discharge plan remains appropriate    Precautions / Restrictions Precautions Precautions: Back Precaution Comments: Pt with good awareness of all precautions Spinal Brace: Lumbar corset;Applied in sitting position   Pertinent Vitals/Pain 7/10 L LE spasms. Patient premedicated    Mobility  Bed Mobility Rolling Right: 6: Modified independent (Device/Increase time) Right Sidelying to Sit: 6: Modified independent (Device/Increase time) Transfers Sit to Stand: 6: Modified independent (Device/Increase time) Stand to Sit: 6: Modified independent (Device/Increase time) Ambulation/Gait Ambulation/Gait Assistance: 4: Min guard Ambulation Distance (Feet): 210 Feet Assistive device: Rolling walker Ambulation/Gait Assistance Details: Patient complaining of L leg spasms making her "jerk" during periods of ambulation. Continued to encourage increase gait speed.  Gait Pattern: Decreased stride length;Decreased step length - left General Gait Details: limited by spasms Stairs: Yes Stairs Assistance: 4: Min guard Stair Management Technique: Two rails Number of Stairs: 5     Exercises     PT Diagnosis:    PT Problem List:   PT Treatment Interventions:     PT Goals Acute Rehab PT Goals PT Goal: Rolling Supine to Right Side - Progress: Met PT Goal: Supine/Side to Sit - Progress: Met PT Goal: Sit at Edge Of Bed - Progress: Met PT Goal:  Sit to Stand - Progress: Met PT Goal: Stand to Sit - Progress: Met PT Goal: Ambulate - Progress: Progressing toward goal PT Goal: Up/Down Stairs - Progress: Met  Visit Information  Last PT Received On: 07/20/11 Assistance Needed: +1    Subjective Data  Subjective: I don't know what it is with my left leg   Cognition  Overall Cognitive Status: Appears within functional limits for tasks assessed/performed Arousal/Alertness: Awake/alert Orientation Level: Appears intact for tasks assessed Behavior During Session: [facility] for tasks performed    Balance     End of Session PT - End of Session Equipment Utilized During Treatment: Gait belt;Back brace Activity Tolerance: Patient tolerated treatment well Patient left: in chair;with call bell/phone within reach    Fredrich Birks 07/20/2011, 10:34 AM 07/20/2011 Fredrich Birks PTA [phone] pager [phone] office

## 2011-07-20 NOTE — Progress Notes (Signed)
07/20/11 NSG 1800 Patient discharged to home with family, discharged instructions given and reviewed with patient.  Patient verbalized understanding. Skin intact at discharge except incision to lower back with steri strips, IV discharged and intact. Patient escorted to car via wheelchair by NT.  Forbes Cellar, RN

## 2011-07-20 NOTE — Discharge Summary (Signed)
Physician Discharge Summary  Patient ID: Autumn Green MRN: [id number] DOB/AGE: 11-06-1960 51 y.o.  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 *    Discharged Condition: good  Hospital Course: Patient was admitted hospital underwent L4-5 L5-S1 posterior lumbar interbody fusion postop she did fairly well over the first 2 days postoperative she progressively mobilize pain was a difficult thing to manage with her however her preoperative leg pain was gone she mobilized and she was angling and voiding spontaneously by postoperative day 4 pain was well controlled on pills she was tolerating regular diet wound is clean dry and she's still be discharged home scheduled followup approximately one week the  Consults: Significant Diagnostic Studies: Treatments: Posterior lumbar interbody fusion L4-5 L5-S1 Discharge Exam: Blood pressure 95/35, pulse 62, temperature 98.2 F (36.8 C), temperature source Oral, resp. rate 18, height 5\' 3"  (1.6 m), weight 197 lb 15.6 oz (89.8 kg), SpO2 100.00%. Strength out of 5 wound clean and dry  Disposition: Home   Medication List  As of 07/20/2011  2:19 PM   TAKE these medications         albuterol 108 (90 BASE) MCG/ACT inhaler   Commonly known as: PROVENTIL HFA;VENTOLIN HFA   Inhale 1-2 puffs into the lungs every 6 (six) hours as needed. For shortness of breath/wheezing      hydrochlorothiazide 25 MG tablet   Commonly known as: HYDRODIURIL   Take 25 mg by mouth daily.      LUTEIN PO   Take 1 tablet by mouth daily.      methylPREDNISolone 4 MG tablet   Commonly known as: MEDROL DOSEPAK   follow package directions      mulitivitamin with minerals Tabs   Take 1 tablet by mouth daily.      oxyCODONE-acetaminophen 5-325 MG per tablet   Commonly known as: PERCOCET   Take 1-2  tablets by mouth every 4 (four) hours as needed.      propranolol ER 80 MG 24 hr capsule   Commonly known as: INDERAL LA   Take 80 mg by mouth daily.      traZODone 50 MG tablet   Commonly known as: DESYREL   Take 50 mg by mouth at bedtime.      VITAMIN D PO   Take 1 tablet by mouth daily.      VITAMIN E PO   Take 1 capsule by mouth daily.      WELCHOL 3.75 G Pack   Generic drug: Colesevelam HCl   Take 3.75 g by mouth daily.             Signed: Delando Satter P 07/20/2011, 2:19 PM

## 2011-08-02 ENCOUNTER — Emergency Department (HOSPITAL_COMMUNITY)
Admission: EM | Admit: 2011-08-02 | Discharge: 2011-08-02 | Disposition: A | Discharge status: Home / Self Care | Payer: Federal, State, Local not specified - PPO | Attending: Emergency Medicine | Admitting: Emergency Medicine

## 2011-08-02 ENCOUNTER — Encounter (HOSPITAL_COMMUNITY): Payer: Self-pay | Admitting: *Deleted

## 2011-08-02 DIAGNOSIS — E7800 Pure hypercholesterolemia, unspecified: Secondary | ICD-10-CM | POA: Insufficient documentation

## 2011-08-02 DIAGNOSIS — E039 Hypothyroidism, unspecified: Secondary | ICD-10-CM | POA: Insufficient documentation

## 2011-08-02 DIAGNOSIS — R220 Localized swelling, mass and lump, head: Secondary | ICD-10-CM | POA: Insufficient documentation

## 2011-08-02 DIAGNOSIS — L2989 Other pruritus: Secondary | ICD-10-CM | POA: Insufficient documentation

## 2011-08-02 DIAGNOSIS — T7840XA Allergy, unspecified, initial encounter: Secondary | ICD-10-CM

## 2011-08-02 DIAGNOSIS — L298 Other pruritus: Secondary | ICD-10-CM | POA: Insufficient documentation

## 2011-08-02 DIAGNOSIS — Z79899 Other long term (current) drug therapy: Secondary | ICD-10-CM | POA: Insufficient documentation

## 2011-08-02 DIAGNOSIS — I10 Essential (primary) hypertension: Secondary | ICD-10-CM | POA: Insufficient documentation

## 2011-08-02 MED ORDER — FAMOTIDINE 20 MG PO TABS: 20.0000 mg | ORAL_TABLET | Freq: Two times a day (BID) | ORAL | Status: DC

## 2011-08-02 MED ORDER — PREDNISONE (PAK) 10 MG PO TABS: 10.0000 mg | ORAL_TABLET | Freq: Every day | ORAL | Status: AC

## 2011-08-02 MED ORDER — SODIUM CHLORIDE 0.9 % IV SOLN
INTRAVENOUS | Status: DC
  Administered 2011-08-02: 100 mL/h via INTRAVENOUS

## 2011-08-02 MED ORDER — FAMOTIDINE IN NACL 20-0.9 MG/50ML-% IV SOLN
20.0000 mg | Freq: Once | INTRAVENOUS | Status: AC
  Administered 2011-08-02: 20 mg via INTRAVENOUS
  Filled 2011-08-02: qty 50

## 2011-08-02 MED ORDER — DIPHENHYDRAMINE HCL 50 MG/ML IJ SOLN
50.0000 mg | Freq: Once | INTRAMUSCULAR | Status: AC
  Administered 2011-08-02: 50 mg via INTRAVENOUS
  Filled 2011-08-02: qty 1

## 2011-08-02 MED ORDER — METHYLPREDNISOLONE SODIUM SUCC 125 MG IJ SOLR
125.0000 mg | Freq: Once | INTRAMUSCULAR | Status: AC
  Administered 2011-08-02: 125 mg via INTRAVENOUS
  Filled 2011-08-02: qty 2

## 2011-08-02 MED ORDER — DIPHENHYDRAMINE HCL 25 MG PO TABS: 25.0000 mg | ORAL_TABLET | Freq: Four times a day (QID) | ORAL | Status: DC

## 2011-08-02 NOTE — ED Provider Notes (Signed)
9:59 AM Patient placed in CDU for further observation and monitoring for allergic reaction.  Sign out received by Tyger Wichman Filbert.  Plan is for reassessment and d/c home if better.  Patient reports her itching and rash have completely subsided, states her lips are much improved.  Denies any swelling or itching in her mouth or throat, any swelling of her tongue, or any difficulty swallowing or breathing.  Pt understands that it may be the neurontin that has caused this reaction and that she should never take another dose and should discuss this with her doctor.  On exam, pt is A&Ox4, NAD, RRR, CTAB, oropharnyx clear, airway widely patent.  Plan is for d/c home with prednisone, pepcid, and benadryl.  Return precautions given.  Patient verbalizes understanding and agrees with plan.      Rise Patience, [place] 08/02/11 1028

## 2011-08-02 NOTE — ED Notes (Signed)
Lip swelling noted

## 2011-08-02 NOTE — ED Notes (Signed)
Received pt. From triage via w/c pt. Alert and oriented, respirations even and regular

## 2011-08-02 NOTE — ED Notes (Signed)
C/o allergic reaction, reports lip & facial swelling and itching all over. Onset 0300 Wednesday morning. Taking a new medicine (neurontin), recent back surgery, using walker & wearing back brace (Dr. Wynetta Emery). LS CTA. Alert, NAD, calm, interactive, skin W&D, fine facial rash noted, facial swelling present. Denies difficulty swallowing or breathing. No meds for sx PTA.

## 2011-08-02 NOTE — ED Provider Notes (Signed)
History     CSN: [id number]  Arrival date & time 08/02/11  0251   First MD Initiated Contact with Patient 08/02/11 0305      Chief Complaint  Patient presents with  . Allergic Reaction    HPI Pt was seen at 0325.  Per pt, c/o gradual onset and persistence of constant "facial swelling" and "itching all over" for the past 1 day.  Denies any worsening of symptoms.  Has not taken any medicines to treat same.  Pt states she was started on neurontin by Neurosurgeon Dr. Wynetta Emery approx 1 week ago for s/p lumbar surgery.  Pt states she took her LD on Tuesday.  Denies CP/palpitations, no SOB/cough, no abd pain, no N/V/D, no dysphagia, no intra-oral edema, no rash, no fevers.     Past Medical History  Diagnosis Date  . Hypertension     about 10 years, well controlled with meds  . High cholesterol   . 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 Hypothyroidism     about 5 years on oral meds  . Spondylosis of lumbar joint     Past Surgical History  Procedure Date  . Breast surgery     had 5 or 6 cyst removed from breast, starting 1979  . Abdominal hysterectomy     2004  . Back surgery     2009 dicsectomy  . Lumbar disc surgery 07/17/2011    decompression    Family History  Problem Relation Age of Onset  . Hypotension Neg Hx   . Malignant hyperthermia Neg Hx   . Pseudochol deficiency Neg Hx     History  Substance Use Topics  . Smoking status: Never Smoker   . Smokeless tobacco: Never Used  . Alcohol Use: No    Review of Systems ROS: Statement: All systems negative except as marked or noted in the HPI; Constitutional: Negative for fever and chills. ; ; Eyes: Negative for eye pain, redness and discharge. ; ; ENMT: Negative for ear pain, hoarseness, nasal congestion, sinus pressure and sore throat. ; ; Cardiovascular: Negative for chest pain, palpitations, diaphoresis, dyspnea and peripheral edema. ; ; Respiratory: Negative for cough, wheezing and stridor. ; ; Gastrointestinal:  Negative for nausea, vomiting, diarrhea, abdominal pain, blood in stool, hematemesis, jaundice and rectal bleeding. . ; ; Genitourinary: Negative for dysuria, flank pain and hematuria. ; ; Musculoskeletal: Negative for back pain and neck pain. Negative for swelling and trauma.; ; Skin: +facial swelling, itching all over.  Negative for rash, abrasions, blisters, bruising and skin lesion.; ; Neuro: Negative for headache, lightheadedness and neck stiffness. Negative for weakness, altered level of consciousness , altered mental status, extremity weakness, paresthesias, involuntary movement, seizure and syncope.     Allergies  Gabapentin  Home Medications   Current Outpatient Rx  Name Route Sig Dispense Refill  . ALBUTEROL SULFATE HFA 108 (90 BASE) MCG/ACT IN AERS Inhalation Inhale 1-2 puffs into the lungs every 6 (six) hours as needed. For shortness of breath/wheezing    . VITAMIN D PO Oral Take 1 tablet by mouth daily.    . COLESEVELAM HCL 3.75 G PO PACK Oral Take 3.75 g by mouth daily.    . CYCLOSPORINE 0.05 % OP EMUL Both Eyes Place 1 drop into both eyes 2 (two) times daily.    Marland Kitchen HYDROCHLOROTHIAZIDE 25 MG PO TABS Oral Take 25 mg by mouth daily.      . ADULT MULTIVITAMIN W/MINERALS CH Oral Take 1 tablet by mouth  daily.    Marland Kitchen PROPRANOLOL HCL ER 80 MG PO CP24 Oral Take 80 mg by mouth daily.    . TRAZODONE HCL 50 MG PO TABS Oral Take 50 mg by mouth at bedtime.    Marland Kitchen VITAMIN E PO Oral Take 1 capsule by mouth daily.      BP 133/81  Pulse 75  Temp(Src) 98.4 F (36.9 C) (Oral)  Resp 18  SpO2 97%  Physical Exam 0330: Physical examination:  Nursing notes reviewed; Vital signs and O2 SAT reviewed;  Constitutional: Well developed, Well nourished, Well hydrated, In no acute distress; Head:  Normocephalic, atraumatic; Eyes: EOMI, PERRL, No scleral icterus; ENMT: Mouth and pharynx normal, Mucous membranes moist, no intra-oral edema, no hoarse voice, no drooling, no stridor; Neck: Supple, Full range of  motion, No lymphadenopathy; Cardiovascular: Regular rate and rhythm, No murmur or gallop; Respiratory: Breath sounds clear & equal bilaterally, No wheezes, speaking full sentences with ease, Normal respiratory effort/excursion; Chest: Nontender, Movement normal; Abdomen: Soft, Nontender, Nondistended, Normal bowel sounds; Extremities: Pulses normal, No tenderness, No edema, No calf edema or asymmetry.; Neuro: AA&Ox3, Major CN grossly intact. Speech clear, No gross focal motor or sensory deficits in extremities.; Skin: Color normal, Warm, Dry, +diffuse mild facial edema including lips, no hives.      ED Course  Procedures    MDM  MDM Reviewed: nursing note and vitals    0515:  Pt given all meds by approx 0410.  Pt's face and lips appear slightly less edemetous.  Pt dozing on and off, resps easy, no wheezing or stridor.  Sats 99% R/A.  Will continue to observe.   0715:  Will continue to observe pt.  Sign out to Dr. Judd Lien.         Laray Anger, DO 08/02/11 1629

## 2011-08-02 NOTE — Discharge Instructions (Signed)
Read the information below.  Please take the medications as prescribed.  Please stop taking the Neurontin and discuss a replacement medication if needed with your doctor.   If you have worsening swelling, new rash, or you have any difficulty swallowing or breathing, return to the ER immediately.  You may return to the ER at any time for worsening condition or any new symptoms that concern you.   Allergic Reaction, Mild to Moderate Allergies may happen from anything your body is sensitive to. This may be food, medications, pollens, chemicals, and nearly anything around you in everyday life that produces allergens. An allergen is anything that causes an allergy producing substance. Allergens cause your body to release allergic antibodies. Through a chain of events, they cause a release of histamine into the blood stream. Histamines are meant to protect you, but they also cause your discomfort. This is why antihistamines are often used for allergies. Heredity is often a factor in causing allergic reactions. This means you may have some of the same allergies as your parents. Allergies happen in all age groups. You may have some idea of what caused your reaction. There are many allergens around [place]. It may be difficult to know what caused your reaction. If this is a first time event, it may never happen again. Allergies cannot be cured but can be controlled with medications. SYMPTOMS  You may get some or all of the following problems from allergies.  Swelling and itching in and around the mouth.   Tearing, itchy eyes.   Nasal congestion and runny nose.   Sneezing and coughing.   An itchy red rash or hives.   Vomiting or diarrhea.   Difficulty breathing.  Seasonal allergies occur in all age groups. They are seasonal because they usually occur during the same season every year. They may be a reaction to molds, grass pollens, or tree pollens. Other causes of allergies are house dust mite allergens, pet  dander and mold spores. These are just a common few of the thousands of allergens around [place]. All of the symptoms listed above happen when you come in contact with pollens and other allergens. Seasonal allergies are usually not life threatening. They are generally more of a nuisance that can often be handled using medications. Hay fever is a combination of all or some of the above listed allergy problems. It may often be treated with simple over-the-counter medications such as diphenhydramine. Take medication as directed. Check with your caregiver or package insert for child dosages. TREATMENT AND HOME CARE INSTRUCTIONS If hives or rash are present:  Take medications as directed.   You may use an over-the-counter antihistamine (diphenhydramine) for hives and itching as needed. Do not drive or drink alcohol until medications used to treat the reaction have worn off. Antihistamines tend to make people sleepy.   Apply cold cloths (compresses) to the skin or take baths in cool water. This will help itching. Avoid hot baths or showers. Heat will make a rash and itching worse.   If your allergies persist and become more severe, and over the counter medications are not effective, there are many new medications your caretaker can prescribe. Immunotherapy or desensitizing injections can be used if all else fails. Follow up with your caregiver if problems continue.  SEEK MEDICAL CARE IF:   Your allergies are becoming progressively more troublesome.   You suspect a food allergy. Symptoms generally happen within 30 minutes of eating a food.   Your symptoms have not gone  away within 2 days or are getting worse.   You develop new symptoms.   You want to retest yourself or your child with a food or drink you think causes an allergic reaction. Never test yourself or your child of a suspected allergy without being under the watchful eye of your caregivers. A second exposure to an allergen may be  life-threatening.  SEEK IMMEDIATE MEDICAL CARE IF:  You develop difficulty breathing or wheezing, or have a tight feeling in your chest or throat.   You develop a swollen mouth, hives, swelling, or itching all over your body.  A severe reaction with any of the above problems should be considered life-threatening. If you suddenly develop difficulty breathing call for local emergency medical help. THIS IS AN EMERGENCY. MAKE SURE YOU:   Understand these instructions.   Will watch your condition.   Will get help right away if you are not doing well or get worse.  Document Released: 12/24/2006 Document Revised: 02/15/2011 Document Reviewed: 12/24/2006 [facility] Patient Information 2012 [place], [place].

## 2011-08-02 NOTE — ED Provider Notes (Signed)
Medical screening examination/treatment/procedure(s) were conducted as a shared visit with non-physician practitioner(s) and myself.  I personally evaluated the patient during the encounter   Laray Anger, DO 08/02/11 1630

## 2012-04-17 ENCOUNTER — Other Ambulatory Visit: Payer: Self-pay | Admitting: Neurosurgery

## 2012-04-17 DIAGNOSIS — M5126 Other intervertebral disc displacement, lumbar region: Secondary | ICD-10-CM

## 2012-04-22 ENCOUNTER — Ambulatory Visit
Admission: RE | Admit: 2012-04-22 | Discharge: 2012-04-22 | Disposition: A | Discharge status: Home / Self Care | Payer: Worker's Compensation | Source: Ambulatory Visit | Attending: Neurosurgery | Admitting: Neurosurgery

## 2012-04-22 DIAGNOSIS — M5126 Other intervertebral disc displacement, lumbar region: Secondary | ICD-10-CM

## 2012-05-02 ENCOUNTER — Ambulatory Visit
Admission: RE | Admit: 2012-05-02 | Discharge: 2012-05-02 | Disposition: A | Discharge status: Home / Self Care | Payer: Federal, State, Local not specified - PPO | Source: Ambulatory Visit | Attending: Internal Medicine | Admitting: Internal Medicine

## 2012-05-02 ENCOUNTER — Other Ambulatory Visit: Payer: Self-pay | Admitting: Internal Medicine

## 2012-05-02 DIAGNOSIS — R52 Pain, unspecified: Secondary | ICD-10-CM

## 2012-05-02 DIAGNOSIS — R0602 Shortness of breath: Secondary | ICD-10-CM

## 2012-05-11 ENCOUNTER — Emergency Department (INDEPENDENT_AMBULATORY_CARE_PROVIDER_SITE_OTHER)
Admission: EM | Admit: 2012-05-11 | Discharge: 2012-05-11 | Disposition: A | Discharge status: Home / Self Care | Payer: Federal, State, Local not specified - PPO | Source: Home / Self Care | Attending: Emergency Medicine | Admitting: Emergency Medicine

## 2012-05-11 ENCOUNTER — Encounter (HOSPITAL_COMMUNITY): Payer: Self-pay | Admitting: *Deleted

## 2012-05-11 DIAGNOSIS — R079 Chest pain, unspecified: Secondary | ICD-10-CM

## 2012-05-11 DIAGNOSIS — M5432 Sciatica, left side: Secondary | ICD-10-CM

## 2012-05-11 DIAGNOSIS — M5430 Sciatica, unspecified side: Secondary | ICD-10-CM

## 2012-05-11 MED ORDER — CYCLOBENZAPRINE HCL 10 MG PO TABS: 10.0000 mg | ORAL_TABLET | Freq: Three times a day (TID) | ORAL | Status: DC | PRN

## 2012-05-11 MED ORDER — HYDROCODONE-ACETAMINOPHEN 5-325 MG PO TABS: 1.0000 | ORAL_TABLET | ORAL | Status: DC | PRN

## 2012-05-11 MED ORDER — PREDNISONE 10 MG PO TABS: 40.0000 mg | ORAL_TABLET | Freq: Every day | ORAL | Status: DC

## 2012-05-11 MED ORDER — PREDNISONE 20 MG PO TABS
60.0000 mg | ORAL_TABLET | Freq: Once | ORAL | Status: AC
  Administered 2012-05-11: 60 mg via ORAL

## 2012-05-11 MED ORDER — HYDROCODONE-ACETAMINOPHEN 5-325 MG PO TABS
ORAL_TABLET | ORAL | Status: AC
  Filled 2012-05-11: qty 1

## 2012-05-11 MED ORDER — HYDROCODONE-ACETAMINOPHEN 5-325 MG PO TABS
1.0000 | ORAL_TABLET | Freq: Once | ORAL | Status: AC
  Administered 2012-05-11: 1 via ORAL

## 2012-05-11 MED ORDER — PREDNISONE 20 MG PO TABS
ORAL_TABLET | ORAL | Status: AC
  Filled 2012-05-11: qty 3

## 2012-05-11 NOTE — ED Notes (Signed)
Patient complains of chest pain with pain in right arm from elbow to rist and pain and numbness bilaterally in legs x 2 weeks.

## 2012-05-17 NOTE — ED Provider Notes (Signed)
History     CSN: [id number]  Arrival date & time 05/11/12  1516   First MD Initiated Contact with Patient 05/11/12 1600      Chief Complaint  Patient presents with  . Chest Pain   HPI: Patient is a 52 y.o. female presenting with chest pain and leg pain. The history is provided by the patient.  Chest Pain Pain location:  Substernal area Pain quality: aching and sharp   Pain radiates to the back: no   Pain severity:  Moderate Onset quality:  Sudden Timing:  Sporadic Progression:  Resolved Chronicity:  Recurrent Context: movement and at rest   Context: not breathing, no drug use, not eating, no intercourse, not lifting and not raising an arm   Relieved by:  Rest and certain positions Worsened by:  Movement Associated symptoms: numbness   Associated symptoms: no abdominal pain, no back pain, no cough, no dizziness, no fatigue, no fever, no nausea, no palpitations, no shortness of breath, not vomiting and no weakness   Risk factors: high cholesterol, hypertension and obesity   Risk factors: no coronary artery disease and no smoking   Leg Pain Location:  Leg Injury: no   Leg location:  L leg Pain details:    Quality:  Aching, sharp and shooting   Severity:  Moderate   Onset quality:  Gradual   Timing:  Intermittent   Progression:  Worsening Chronicity:  Recurrent Prior injury to area:  No Relieved by:  Nothing Worsened by:  Bearing weight Associated symptoms: numbness   Associated symptoms: no back pain, no fatigue, no fever and no muscle weakness   Pt has several c/o's that seem chronic in nature. Reports several week h/o intermittent CP that is substernal and radiates around to below (L) breast area. It is worse at times w/ some movements but also occurs at rest. Today when she awoke from a nap she noted the pain had returned. Pain is not associated w/ nausea, dizziness, SOB or other symptoms and often lasts only minutes before resolving though at times somewhat longer. Saw  her PCP this past Thursday and Dr Allyne Gee is in the process of getting her set up for an outpt cardiology work-up. She is currently CP free.  Pt also c/o LLE pain that has also been intermittent over the last several weeks. The pain at times will also radiate into the RLE but predominately the LLE. She recently has noticed intermittent numbness and tingling in the LLE as well. Denies loss of bowels or bladder. She has a h/o chronic LBP and 2 low back surgeries, the last of which was in 07/2011. LLE pain curerently 4-5/10.  Pt remains on disability since last back surgery. Pt has also seen Dr Wynetta Emery recently  for the LLE pain and he has suggested the possible need for f/u Ct vs MRI if symptoms persist.  Pt very vague when asked what about either of these symptoms was different today. "I was just concerned".   Past Medical History  Diagnosis Date  . Hypertension     about 10 years, well controlled with meds  . High cholesterol   . 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 Hypothyroidism     about 5 years on oral meds  . Spondylosis of lumbar joint     Past Surgical History  Procedure Laterality Date  . Breast surgery      had 5 or 6 cyst removed from breast, starting 1979  . Abdominal hysterectomy  2004  . Back surgery      2009 dicsectomy  . Lumbar disc surgery  07/17/2011    decompression    Family History  Problem Relation Age of Onset  . Hypotension Neg Hx   . Malignant hyperthermia Neg Hx   . Pseudochol deficiency Neg Hx     History  Substance Use Topics  . Smoking status: Never Smoker   . Smokeless tobacco: Never Used  . Alcohol Use: No    OB History   Grav Para Term Preterm Abortions TAB SAB Ect Mult Living                  Review of Systems  Constitutional: Negative.  Negative for fever and fatigue.  HENT: Negative.   Eyes: Negative.   Respiratory: Negative for cough, chest tightness, shortness of breath and wheezing.   Cardiovascular: Positive for chest  pain. Negative for palpitations and leg swelling.  Gastrointestinal: Negative for nausea, vomiting and abdominal pain.  Endocrine: Negative.   Genitourinary: Negative.   Musculoskeletal: Negative for back pain.  Skin: Negative.   Allergic/Immunologic: Negative.   Neurological: Positive for numbness. Negative for dizziness and weakness.  Hematological: Negative.   Psychiatric/Behavioral: Negative.     Allergies  Gabapentin  Home Medications   Current Outpatient Rx  Name  Route  Sig  Dispense  Refill  . EXPIRED: albuterol (PROVENTIL HFA;VENTOLIN HFA) 108 (90 BASE) MCG/ACT inhaler   Inhalation   Inhale 1-2 puffs into the lungs every 6 (six) hours as needed. For shortness of breath/wheezing         . Cholecalciferol (VITAMIN D PO)   Oral   Take 1 tablet by mouth daily.         . Colesevelam HCl (WELCHOL) 3.75 G PACK   Oral   Take 3.75 g by mouth daily.         . cyclobenzaprine (FLEXERIL) 10 MG tablet   Oral   Take 1 tablet (10 mg total) by mouth 3 (three) times daily as needed for muscle spasms.   20 tablet   0   . cycloSPORINE (RESTASIS) 0.05 % ophthalmic emulsion   Both Eyes   Place 1 drop into both eyes 2 (two) times daily.         Marland Kitchen EXPIRED: diphenhydrAMINE (BENADRYL) 25 MG tablet   Oral   Take 1 tablet (25 mg total) by mouth every 6 (six) hours. Take 1 tablet PO Q 6 hrs for 3 days then as needed   20 tablet   0   . famotidine (PEPCID) 20 MG tablet   Oral   Take 1 tablet (20 mg total) by mouth 2 (two) times daily. For 3 days then as needed.   30 tablet   0   . hydrochlorothiazide (HYDRODIURIL) 25 MG tablet   Oral   Take 25 mg by mouth daily.           Marland Kitchen HYDROcodone-acetaminophen (NORCO/VICODIN) 5-325 MG per tablet   Oral   Take 1 tablet by mouth every 4 (four) hours as needed for pain.   10 tablet   0   . Multiple Vitamin (MULITIVITAMIN WITH MINERALS) TABS   Oral   Take 1 tablet by mouth daily.         . predniSONE (DELTASONE) 10 MG  tablet   Oral   Take 4 tablets (40 mg total) by mouth daily.   10 tablet   0   . propranolol ER (INDERAL LA) 80 MG  24 hr capsule   Oral   Take 80 mg by mouth daily.         . traZODone (DESYREL) 50 MG tablet   Oral   Take 50 mg by mouth at bedtime.         Marland Kitchen VITAMIN E PO   Oral   Take 1 capsule by mouth daily.           BP 149/87  Pulse 60  Temp(Src) 98.6 F (37 C) (Oral)  Resp 17  SpO2 99%  Physical Exam  Constitutional: She is oriented to person, place, and time. She appears well-developed and well-nourished.  HENT:  Head: Normocephalic and atraumatic.  Eyes: Conjunctivae are normal.  Neck: Neck supple.  Cardiovascular: Normal rate and regular rhythm.   Pulmonary/Chest: Effort normal and breath sounds normal.  Abdominal: Soft. Bowel sounds are normal.  Musculoskeletal:       Right shoulder: She exhibits normal range of motion, no bony tenderness and no deformity.  Neurological: She is alert and oriented to person, place, and time. She has normal strength and normal reflexes.  Skin: Skin is warm and dry.  Psychiatric: She has a normal mood and affect.    ED Course  Procedures   Date: 05/11/2012  Rate:62  Rhythm: NSR  QRS Axis: normal  Intervals: normal  ST/T Wave abnormalities: non-specific ST abnormality  Conduction Disutrbances: None  Narrative Interpretation: No ischemic changes Old EKG Reviewed:  No acute changes since last EKG     Labs Reviewed - No data to display No results found.   1. Chest pain   2. Sciatica, left       MDM  Pt appears in NAD distress. Exam is unremarkable. No focal neurological deficits identified. EKG w/o acute changes. Given lack of acute nature of pt's symptoms and findings I have encouraged pt to F/U w/ PCP regarding her cardiology f/u that is planned and w/ Dr Wynetta Emery for further eval of LLE pain if persist. Will treat w/ short course of Prednisone, a muscle relaxer and a short course of medication for pain until  pt can arrange f/u. Discussed pt w/ Dr Artis Flock who has also reviewed EKG and he is in agreement w/ plan.          Leanne Chang, NP 05/21/12 2052

## 2012-05-27 NOTE — ED Provider Notes (Signed)
Medical screening examination/treatment/procedure(s) were performed by resident physician or non-physician practitioner and as supervising physician I was immediately available for consultation/collaboration.   Barkley Bruns MD.   Linna Hoff, MD 05/27/12 1146

## 2012-08-08 ENCOUNTER — Ambulatory Visit
Admission: RE | Admit: 2012-08-08 | Discharge: 2012-08-08 | Disposition: A | Discharge status: Home / Self Care | Payer: Federal, State, Local not specified - PPO | Source: Ambulatory Visit | Attending: Gastroenterology | Admitting: Gastroenterology

## 2012-08-08 ENCOUNTER — Other Ambulatory Visit: Payer: Self-pay | Admitting: Gastroenterology

## 2012-08-08 DIAGNOSIS — R1032 Left lower quadrant pain: Secondary | ICD-10-CM

## 2012-08-08 MED ORDER — IOHEXOL 300 MG/ML  SOLN
125.0000 mL | Freq: Once | INTRAMUSCULAR | Status: AC | PRN
  Administered 2012-08-08: 125 mL via INTRAVENOUS

## 2012-08-18 DIAGNOSIS — I10 Essential (primary) hypertension: Secondary | ICD-10-CM | POA: Insufficient documentation

## 2012-10-02 ENCOUNTER — Encounter (HOSPITAL_COMMUNITY): Payer: Self-pay | Admitting: Pharmacy Technician

## 2012-10-14 ENCOUNTER — Encounter (HOSPITAL_COMMUNITY)
Admission: RE | Disposition: A | Discharge status: Home / Self Care | Payer: Self-pay | Source: Ambulatory Visit | Attending: Cardiology

## 2012-10-14 ENCOUNTER — Ambulatory Visit (HOSPITAL_COMMUNITY)
Admission: RE | Admit: 2012-10-14 | Discharge: 2012-10-14 | Disposition: A | Discharge status: Home / Self Care | Payer: Federal, State, Local not specified - PPO | Source: Ambulatory Visit | Attending: Cardiology | Admitting: Cardiology

## 2012-10-14 DIAGNOSIS — Z8249 Family history of ischemic heart disease and other diseases of the circulatory system: Secondary | ICD-10-CM | POA: Insufficient documentation

## 2012-10-14 DIAGNOSIS — Z6837 Body mass index (BMI) 37.0-37.9, adult: Secondary | ICD-10-CM | POA: Insufficient documentation

## 2012-10-14 DIAGNOSIS — R079 Chest pain, unspecified: Secondary | ICD-10-CM | POA: Insufficient documentation

## 2012-10-14 DIAGNOSIS — K219 Gastro-esophageal reflux disease without esophagitis: Secondary | ICD-10-CM | POA: Insufficient documentation

## 2012-10-14 DIAGNOSIS — R9439 Abnormal result of other cardiovascular function study: Secondary | ICD-10-CM | POA: Insufficient documentation

## 2012-10-14 DIAGNOSIS — E669 Obesity, unspecified: Secondary | ICD-10-CM | POA: Insufficient documentation

## 2012-10-14 DIAGNOSIS — Z79899 Other long term (current) drug therapy: Secondary | ICD-10-CM | POA: Insufficient documentation

## 2012-10-14 DIAGNOSIS — I771 Stricture of artery: Secondary | ICD-10-CM | POA: Insufficient documentation

## 2012-10-14 DIAGNOSIS — Z888 Allergy status to other drugs, medicaments and biological substances status: Secondary | ICD-10-CM | POA: Insufficient documentation

## 2012-10-14 DIAGNOSIS — E785 Hyperlipidemia, unspecified: Secondary | ICD-10-CM | POA: Insufficient documentation

## 2012-10-14 DIAGNOSIS — I10 Essential (primary) hypertension: Secondary | ICD-10-CM | POA: Insufficient documentation

## 2012-10-14 DIAGNOSIS — Z7982 Long term (current) use of aspirin: Secondary | ICD-10-CM | POA: Insufficient documentation

## 2012-10-14 DIAGNOSIS — Z85038 Personal history of other malignant neoplasm of large intestine: Secondary | ICD-10-CM | POA: Insufficient documentation

## 2012-10-14 HISTORY — PX: LEFT HEART CATHETERIZATION WITH CORONARY ANGIOGRAM: SHX5451

## 2012-10-14 SURGERY — LEFT HEART CATHETERIZATION WITH CORONARY ANGIOGRAM
Anesthesia: LOCAL

## 2012-10-14 MED ORDER — HEPARIN (PORCINE) IN NACL 2-0.9 UNIT/ML-% IJ SOLN
INTRAMUSCULAR | Status: AC
  Filled 2012-10-14: qty 1000

## 2012-10-14 MED ORDER — SODIUM CHLORIDE 0.9 % IJ SOLN: 3.0000 mL | INTRAMUSCULAR | Status: DC | PRN

## 2012-10-14 MED ORDER — HYDROMORPHONE HCL PF 2 MG/ML IJ SOLN
INTRAMUSCULAR | Status: AC
  Filled 2012-10-14: qty 1

## 2012-10-14 MED ORDER — SODIUM CHLORIDE 0.9 % IJ SOLN: 3.0000 mL | Freq: Two times a day (BID) | INTRAMUSCULAR | Status: DC

## 2012-10-14 MED ORDER — SODIUM CHLORIDE 0.9 % IV SOLN: 1.0000 mL/kg/h | INTRAVENOUS | Status: DC

## 2012-10-14 MED ORDER — ASPIRIN 81 MG PO CHEW
CHEWABLE_TABLET | ORAL | Status: AC
  Filled 2012-10-14: qty 4

## 2012-10-14 MED ORDER — SODIUM CHLORIDE 0.9 % IV SOLN: 250.0000 mL | INTRAVENOUS | Status: DC | PRN

## 2012-10-14 MED ORDER — ONDANSETRON HCL 4 MG/2ML IJ SOLN: 4.0000 mg | Freq: Four times a day (QID) | INTRAMUSCULAR | Status: DC | PRN

## 2012-10-14 MED ORDER — ACETAMINOPHEN 325 MG PO TABS
ORAL_TABLET | ORAL | Status: AC
  Filled 2012-10-14: qty 2

## 2012-10-14 MED ORDER — ASPIRIN 81 MG PO CHEW
324.0000 mg | CHEWABLE_TABLET | ORAL | Status: AC
  Administered 2012-10-14: 324 mg via ORAL

## 2012-10-14 MED ORDER — SODIUM CHLORIDE 0.9 % IV SOLN
INTRAVENOUS | Status: DC
  Administered 2012-10-14: 06:00:00 via INTRAVENOUS

## 2012-10-14 MED ORDER — ACETAMINOPHEN 325 MG PO TABS
650.0000 mg | ORAL_TABLET | ORAL | Status: DC | PRN
  Administered 2012-10-14: 650 mg via ORAL

## 2012-10-14 MED ORDER — MIDAZOLAM HCL 2 MG/2ML IJ SOLN
INTRAMUSCULAR | Status: AC
  Filled 2012-10-14: qty 2

## 2012-10-14 MED ORDER — LIDOCAINE HCL (PF) 1 % IJ SOLN
INTRAMUSCULAR | Status: AC
  Filled 2012-10-14: qty 30

## 2012-10-14 MED ORDER — NITROGLYCERIN 0.2 MG/ML ON CALL CATH LAB
INTRAVENOUS | Status: AC
  Filled 2012-10-14: qty 1

## 2012-10-14 NOTE — CV Procedure (Signed)
Procedure performed:  Left heart catheterization including hemodynamic monitoring of the left ventricle, LV gram, selective right and left coronary arteriography.  Indication patient is a 52 year-old [place] American  female with history of hypertension,  hyperlipidemia, who presents with chest pain. Patient has  had non invasive testing which was abnormal.  Hence is brought to the cardiac catheterization lab to evaluate the  coronary anatomy for definitive diagnosis of CAD.  Hemodynamic data:  Left ventricular pressure was 121/9 with LVEDP of 15 mm mercury. Aortic pressure was 133/74 with a mean of 99 mm mercury. There was no pressure gradient across the aortic valve  Left ventricle: Performed in the RAO projection revealed LVEF of 60%. There was No MR. No wall motion abnormality.  Right coronary artery: The vessel is smooth, normal,  Dominant.  Left main coronary artery is large and normal.  Circumflex coronary artery: A large vessel giving origin to a large obtuse marginal 1. It is smooth and normal.   LAD:  LAD gives origin to a large diagonal-1.  LAD is normal.  Impression: Normal coronary arteries, right dominant circulation. Mild tortuosity of the coronary arteries evident suggest hypertensive heart disease. Abnormal stress EKG is probably due to hypertension and hypertensive heart disease.   Technique: Under sterile precautions using a 6 French right radial  arterial access, a 6 French sheath was introduced into the right radial artery. A 5 [place] Tig 4 catheter was advanced into the ascending aorta selective  right coronary artery and left coronary artery was cannulated and angiography was performed in multiple views. The catheter was pulled back Out of the body over exchange length J-wire.  Same Catheter was used to perform LV gram which was performed in RAO projection.  Catheter exchanged out of the body over J-Wire. NO immediate complications noted. Patient tolerated the  procedure well.   Rec: Medical therapy with aggressive risk factor reduction.   Disposition: Will be discharged home today with outpatient follow up.

## 2012-10-14 NOTE — H&P (Signed)
  Please see office visit notes for complete details of HPI.  

## 2012-10-14 NOTE — Interval H&P Note (Signed)
History and Physical Interval Note:  10/14/2012 7:45 AM  Autumn Green  has presented today for surgery, with the diagnosis of positive stress test/cp  The various methods of treatment have been discussed with the patient and family. After consideration of risks, benefits and other options for treatment, the patient has consented to  Procedure(s): LEFT HEART CATHETERIZATION WITH CORONARY ANGIOGRAM (N/A) possible PCIas a surgical intervention .  The patient's history has been reviewed, patient examined, no change in status, stable for surgery.  I have reviewed the patient's chart and labs.  Questions were answered to the patient's satisfaction.   Cath Lab Visit (complete for each Cath Lab visit)  Clinical Evaluation Leading to the Procedure:   ACS: no  Non-ACS:    Anginal Classification: CCS III  Anti-ischemic medical therapy: Maximal Therapy (2 or more classes of medications)  Non-Invasive Test Results: Intermediate-risk stress test findings: cardiac mortality 1-3%/year  Prior CABG: No previous CABG        [facility]

## 2013-01-01 ENCOUNTER — Other Ambulatory Visit: Payer: Self-pay | Admitting: Neurosurgery

## 2013-01-01 DIAGNOSIS — M545 Low back pain, unspecified: Secondary | ICD-10-CM

## 2013-01-06 ENCOUNTER — Other Ambulatory Visit: Payer: Federal, State, Local not specified - PPO

## 2013-01-08 ENCOUNTER — Ambulatory Visit
Admission: RE | Admit: 2013-01-08 | Discharge: 2013-01-08 | Disposition: A | Discharge status: Home / Self Care | Payer: Worker's Compensation | Source: Ambulatory Visit | Attending: Neurosurgery | Admitting: Neurosurgery

## 2013-01-08 DIAGNOSIS — M545 Low back pain, unspecified: Secondary | ICD-10-CM

## 2013-08-11 ENCOUNTER — Emergency Department (HOSPITAL_COMMUNITY): Payer: Federal, State, Local not specified - PPO

## 2013-08-11 ENCOUNTER — Encounter (HOSPITAL_COMMUNITY): Payer: Self-pay | Admitting: Emergency Medicine

## 2013-08-11 ENCOUNTER — Emergency Department (HOSPITAL_COMMUNITY)
Admission: EM | Admit: 2013-08-11 | Discharge: 2013-08-11 | Disposition: A | Discharge status: Home / Self Care | Payer: Federal, State, Local not specified - PPO | Attending: Emergency Medicine | Admitting: Emergency Medicine

## 2013-08-11 DIAGNOSIS — E669 Obesity, unspecified: Secondary | ICD-10-CM | POA: Insufficient documentation

## 2013-08-11 DIAGNOSIS — Z8611 Personal history of tuberculosis: Secondary | ICD-10-CM | POA: Insufficient documentation

## 2013-08-11 DIAGNOSIS — R52 Pain, unspecified: Secondary | ICD-10-CM

## 2013-08-11 DIAGNOSIS — Z79899 Other long term (current) drug therapy: Secondary | ICD-10-CM | POA: Insufficient documentation

## 2013-08-11 DIAGNOSIS — Z9889 Other specified postprocedural states: Secondary | ICD-10-CM | POA: Insufficient documentation

## 2013-08-11 DIAGNOSIS — R079 Chest pain, unspecified: Secondary | ICD-10-CM

## 2013-08-11 DIAGNOSIS — I10 Essential (primary) hypertension: Secondary | ICD-10-CM | POA: Insufficient documentation

## 2013-08-11 LAB — I-STAT TROPONIN, ED
TROPONIN I, POC: 0 ng/mL (ref 0.00–0.08)
Troponin i, poc: 0 ng/mL (ref 0.00–0.08)

## 2013-08-11 LAB — CBC
HEMATOCRIT: 36.4 % (ref 36.0–46.0)
HEMOGLOBIN: 11.8 g/dL — AB (ref 12.0–15.0)
MCH: 29.4 pg (ref 26.0–34.0)
MCHC: 32.4 g/dL (ref 30.0–36.0)
MCV: 90.8 fL (ref 78.0–100.0)
Platelets: 352 10*3/uL (ref 150–400)
RBC: 4.01 MIL/uL (ref 3.87–5.11)
RDW: 13.3 % (ref 11.5–15.5)
WBC: 5.2 10*3/uL (ref 4.0–10.5)

## 2013-08-11 LAB — BASIC METABOLIC PANEL
BUN: 15 mg/dL (ref 6–23)
CO2: 24 mEq/L (ref 19–32)
CREATININE: 0.69 mg/dL (ref 0.50–1.10)
Calcium: 9.6 mg/dL (ref 8.4–10.5)
Chloride: 104 mEq/L (ref 96–112)
GFR calc Af Amer: >90 mL/min (ref >90)
GLUCOSE: 90 mg/dL (ref 70–99)
POTASSIUM: 4.1 meq/L (ref 3.7–5.3)
Sodium: 139 mEq/L (ref 137–147)

## 2013-08-11 MED ORDER — GI COCKTAIL ~~LOC~~
30.0000 mL | Freq: Once | ORAL | Status: AC
  Administered 2013-08-11: 30 mL via ORAL
  Filled 2013-08-11: qty 30

## 2013-08-11 MED ORDER — IOHEXOL 350 MG/ML SOLN
100.0000 mL | Freq: Once | INTRAVENOUS | Status: AC | PRN
  Administered 2013-08-11: 100 mL via INTRAVENOUS

## 2013-08-11 MED ORDER — ACETAMINOPHEN 500 MG PO TABS
1000.0000 mg | ORAL_TABLET | Freq: Once | ORAL | Status: AC
  Administered 2013-08-11: 1000 mg via ORAL
  Filled 2013-08-11: qty 2

## 2013-08-11 NOTE — ED Notes (Signed)
Per pt sts about 8 this am started having chest pain, with nausea and SOB. sts radiation into neck, back and arm.

## 2013-08-11 NOTE — ED Provider Notes (Signed)
CSN: [id number]     Arrival date & time 08/11/13  1144 History   First MD Initiated Contact with Patient 08/11/13 1235     Chief Complaint  Patient presents with  . Chest Pain     (Consider location/radiation/quality/duration/timing/severity/associated sxs/prior Treatment) HPI Complained of anterior chest pain radiating to neck and upper back and to left arm onset 8:30 AM today. Symptoms accompanied by nausea and no shortness of breath. She treated herself with aspirin and her normal blood pressure medications, with almost complete relief. Presently discomfort is minimal. No other associated symptoms. Nothing makes symptoms better or worse. Past Medical History  Diagnosis Date  . Hypertension     about 10 years, well controlled with meds  . High cholesterol   . 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 Hypothyroidism     about 5 years on oral meds  . Spondylosis of lumbar joint    Past Surgical History  Procedure Laterality Date  . Breast surgery      had 5 or 6 cyst removed from breast, starting 1979  . Abdominal hysterectomy      2004  . Back surgery      2009 dicsectomy  . Lumbar disc surgery  07/17/2011    decompression   Family History  Problem Relation Age of Onset  . Hypotension Neg Hx   . Malignant hyperthermia Neg Hx   . Pseudochol deficiency Neg Hx    History  Substance Use Topics  . Smoking status: Never Smoker   . Smokeless tobacco: Never Used  . Alcohol Use: No   OB History   Grav Para Term Preterm Abortions TAB SAB Ect Mult Living                 Review of Systems  Constitutional: Negative.   HENT: Negative.   Respiratory: Positive for shortness of breath.   Cardiovascular: Positive for chest pain.  Gastrointestinal: Positive for nausea.  Musculoskeletal: Negative.   Skin: Negative.   Neurological: Negative.   Psychiatric/Behavioral: Negative.   All other systems reviewed and are negative.     Allergies  Gabapentin  Home Medications    Prior to Admission medications   Medication Sig Start Date End Date Taking? Authorizing Provider  hydrochlorothiazide (MICROZIDE) 12.5 MG capsule Take 12.5 mg by mouth daily.   Yes Historical Provider, MD  Multiple Vitamin (MULITIVITAMIN WITH MINERALS) TABS Take 1 tablet by mouth daily.   Yes Historical Provider, MD  albuterol (PROVENTIL HFA;VENTOLIN HFA) 108 (90 BASE) MCG/ACT inhaler Inhale 1-2 puffs into the lungs every 6 (six) hours as needed. For shortness of breath/wheezing 03/20/11   Reuben Likes, MD  cycloSPORINE (RESTASIS) 0.05 % ophthalmic emulsion Place 1 drop into both eyes 2 (two) times daily.    Historical Provider, MD   BP 170/97  Pulse 56  Temp(Src) 98 F (36.7 C)  Resp 18  SpO2 100% Physical Exam  Nursing note and vitals reviewed. Constitutional: She appears well-developed and well-nourished.  HENT:  Head: Normocephalic and atraumatic.  Eyes: Conjunctivae are normal. Pupils are equal, round, and reactive to light.  Neck: Neck supple. No tracheal deviation present. No thyromegaly present.  No bruit  Cardiovascular: Normal rate and regular rhythm.   No murmur heard. Pulmonary/Chest: Effort normal and breath sounds normal.  Abdominal: Soft. Bowel sounds are normal. She exhibits no distension. There is no tenderness.  Obese  Musculoskeletal: Normal range of motion. She exhibits no edema and no tenderness.  Neurological: She  is alert. Coordination normal.  Skin: Skin is warm and dry. No rash noted.  Psychiatric: She has a normal mood and affect.    ED Course  Procedures (including critical care time) Labs Review Labs Reviewed  CBC - Abnormal; Notable for the following:    Hemoglobin 11.8 (*)    All other components within normal limits  BASIC METABOLIC PANEL  I-STAT TROPOININ, ED    Imaging Review Dg Chest 2 View  08/11/2013   CLINICAL DATA:  Chest pain  EXAM: CHEST  2 VIEW  COMPARISON:  05/02/2012  FINDINGS: S-shaped thoracolumbar spine curvature. Midline  trachea. Mild cardiomegaly with a mildly tortuous thoracic aorta. No pleural effusion or pneumothorax. Right apical pleural thickening. Clear lungs.  IMPRESSION: Mild cardiomegaly, without acute disease.   Electronically Signed   By: Jeronimo Greaves M.D.   On: 08/11/2013 12:41     EKG Interpretation   Date/Time:  Tuesday August 11 2013 11:49:17 EDT Ventricular Rate:  56 PR Interval:  158 QRS Duration: 80 QT Interval:  420 QTC Calculation: 405 R Axis:   74 Text Interpretation:  Sinus bradycardia Otherwise normal ECG Since last  tracing rate slower Confirmed by Ethelda Chick  MD, Marvelous [place] [id number]) on 08/11/2013  12:58:14 PM     4:30 PM patient states discomfort anterior chest is minimal. Results for orders placed during the hospital encounter of 08/11/13  CBC      Result Value Ref Range   WBC 5.2  4.0 - 10.5 K/uL   RBC 4.01  3.87 - 5.11 MIL/uL   Hemoglobin 11.8 (*) 12.0 - 15.0 g/dL   HCT 65.7  84.6 - 96.2 %   MCV 90.8  78.0 - 100.0 fL   MCH 29.4  26.0 - 34.0 pg   MCHC 32.4  30.0 - 36.0 g/dL   RDW 95.2  84.1 - 32.4 %   Platelets 352  150 - 400 K/uL  BASIC METABOLIC PANEL      Result Value Ref Range   Sodium 139  137 - 147 mEq/L   Potassium 4.1  3.7 - 5.3 mEq/L   Chloride 104  96 - 112 mEq/L   CO2 24  19 - 32 mEq/L   Glucose, Bld 90  70 - 99 mg/dL   BUN 15  6 - 23 mg/dL   Creatinine, Ser 4.01  0.50 - 1.10 mg/dL   Calcium 9.6  8.4 - 02.7 mg/dL   GFR calc non Af Amer >90  >90 mL/min   GFR calc Af Amer >90  >90 mL/min  I-STAT TROPOININ, ED      Result Value Ref Range   Troponin i, poc 0.00  0.00 - 0.08 ng/mL   Comment 3           I-STAT TROPOININ, ED      Result Value Ref Range   Troponin i, poc 0.00  0.00 - 0.08 ng/mL   Comment 3            Dg Chest 2 View  08/11/2013   CLINICAL DATA:  Chest pain  EXAM: CHEST  2 VIEW  COMPARISON:  05/02/2012  FINDINGS: S-shaped thoracolumbar spine curvature. Midline trachea. Mild cardiomegaly with a mildly tortuous thoracic aorta. No pleural effusion or  pneumothorax. Right apical pleural thickening. Clear lungs.  IMPRESSION: Mild cardiomegaly, without acute disease.   Electronically Signed   By: Jeronimo Greaves M.D.   On: 08/11/2013 12:41   Chest xray viewed by me. Pt signed out to Dr. Blinda Leatherwood 430  pm MDM  Case discussed with Dr.Ganji. In light of her negative cardiac markers and nonacute EKG she is safe for discharge, if CT angiogram shows no acute findings given normal cardiac catheterization less than one year ago.  close outpatient followup this week in  Dr. Jodi MarbleGangi's office. He will call her to schedule appointment. Final diagnoses:  None        Doug SouSam Norelle Runnion, MD 08/11/13 [phone]

## 2013-08-11 NOTE — Discharge Instructions (Signed)
Chest Pain (Nonspecific) Dr. Verl Dicker office will call you tomorrow to schedule an appointment to appear in the office this week. If you don't hear from the office by tomorrow afternoon call to schedule the appointment. Tell office staff that Dr.Samera Macy spoke with Dr. Jacinto Halim about your case It is often hard to give a specific diagnosis for the cause of chest pain. There is always a chance that your pain could be related to something serious, such as a heart attack or a blood clot in the lungs. You need to follow up with your caregiver for further evaluation. CAUSES   Heartburn.  Pneumonia or bronchitis.  Anxiety or stress.  Inflammation around your heart (pericarditis) or lung (pleuritis or pleurisy).  A blood clot in the lung.  A collapsed lung (pneumothorax). It can develop suddenly on its own (spontaneous pneumothorax) or from injury (trauma) to the chest.  Shingles infection (herpes zoster virus). The chest wall is composed of bones, muscles, and cartilage. Any of these can be the source of the pain.  The bones can be bruised by injury.  The muscles or cartilage can be strained by coughing or overwork.  The cartilage can be affected by inflammation and become sore (costochondritis). DIAGNOSIS  Lab tests or other studies, such as X-rays, electrocardiography, stress testing, or cardiac imaging, may be needed to find the cause of your pain.  TREATMENT   Treatment depends on what may be causing your chest pain. Treatment may include:  Acid blockers for heartburn.  Anti-inflammatory medicine.  Pain medicine for inflammatory conditions.  Antibiotics if an infection is present.  You may be advised to change lifestyle habits. This includes stopping smoking and avoiding alcohol, caffeine, and chocolate.  You may be advised to keep your head raised (elevated) when sleeping. This reduces the chance of acid going backward from your stomach into your esophagus.  Most of the time,  nonspecific chest pain will improve within 2 to 3 days with rest and mild pain medicine. HOME CARE INSTRUCTIONS   If antibiotics were prescribed, take your antibiotics as directed. Finish them even if you start to feel better.  For the next few days, avoid physical activities that bring on chest pain. Continue physical activities as directed.  Do not smoke.  Avoid drinking alcohol.  Only take over-the-counter or prescription medicine for pain, discomfort, or fever as directed by your caregiver.  Follow your caregiver's suggestions for further testing if your chest pain does not go away.  Keep any follow-up appointments you made. If you do not go to an appointment, you could develop lasting (chronic) problems with pain. If there is any problem keeping an appointment, you must call to reschedule. SEEK MEDICAL CARE IF:   You think you are having problems from the medicine you are taking. Read your medicine instructions carefully.  Your chest pain does not go away, even after treatment.  You develop a rash with blisters on your chest. SEEK IMMEDIATE MEDICAL CARE IF:   You have increased chest pain or pain that spreads to your arm, neck, jaw, back, or abdomen.  You develop shortness of breath, an increasing cough, or you are coughing up blood.  You have severe back or abdominal pain, feel nauseous, or vomit.  You develop severe weakness, fainting, or chills.  You have a fever. THIS IS AN EMERGENCY. Do not wait to see if the pain will go away. Get medical help at once. Call your local emergency services (911 in [place] Do not drive yourself  to the hospital. MAKE SURE YOU:   Understand these instructions.  Will watch your condition.  Will get help right away if you are not doing well or get worse. Document Released: 12/06/2004 Document Revised: 05/21/2011 Document Reviewed: 10/02/2007 [facility] Patient Information 2014 [place].

## 2013-08-11 NOTE — ED Provider Notes (Signed)
Patient signed out to me to followup on CT chest. Patient seen by Doctor Rennis Chris for chest pain. Patient has had 2 troponins which were negative. Her pain is somewhat atypical. Her EKG did not show any changes. She was awaiting CT scan of the chest to rule out aortic dissection. CT scan has been performed and is negative for any acute pathology. Patient will be discharged, continue her current medications and follow up with Doctor Nadara Eaton.  Gilda Crease, MD 08/11/13 [phone]

## 2013-11-18 ENCOUNTER — Encounter (HOSPITAL_COMMUNITY): Payer: Self-pay | Admitting: Emergency Medicine

## 2013-11-18 ENCOUNTER — Emergency Department (INDEPENDENT_AMBULATORY_CARE_PROVIDER_SITE_OTHER)
Admission: EM | Admit: 2013-11-18 | Discharge: 2013-11-18 | Disposition: A | Discharge status: Home / Self Care | Payer: Federal, State, Local not specified - PPO | Source: Home / Self Care | Attending: Emergency Medicine | Admitting: Emergency Medicine

## 2013-11-18 DIAGNOSIS — M654 Radial styloid tenosynovitis [de Quervain]: Secondary | ICD-10-CM

## 2013-11-18 MED ORDER — MELOXICAM 15 MG PO TABS: 15.0000 mg | ORAL_TABLET | Freq: Every day | ORAL | Status: DC

## 2013-11-18 MED ORDER — HYDROCODONE-ACETAMINOPHEN 5-325 MG PO TABS
ORAL_TABLET | ORAL | Status: DC
Start: 2013-11-18 — End: 2016-04-17

## 2013-11-18 MED ORDER — METHYLPREDNISOLONE ACETATE 40 MG/ML IJ SUSP
INTRAMUSCULAR | Status: AC
  Filled 2013-11-18: qty 5

## 2013-11-18 MED ORDER — LIDOCAINE HCL 2 % IJ SOLN
INTRAMUSCULAR | Status: AC
  Filled 2013-11-18: qty 20

## 2013-11-18 NOTE — Discharge Instructions (Signed)
De Quervain's Disease De Quervain's disease is a condition often seen in racquet sports where there is a soreness (inflammation) in the cord like structures (tendons) which attach muscle to bone on the thumb side of the wrist. There may be a tightening of the tissuesaround the tendons. This condition is often helped by giving up or modifying the activity which caused it. When conservative treatment does not help, surgery may be required. Conservative treatment could include changes in the activity which brought about the problem or made it worse. Anti-inflammatory medications and injections may be used to help decrease the inflammation and help with pain control. Your caregiver will help you determine which is best for you. DIAGNOSIS  Often the diagnosis (learning what is wrong) can be made by examination. Sometimes x-rays are required. HOME CARE INSTRUCTIONS   Apply ice to the sore area for 15-20 minutes, 03-04 times per day while awake. Put the ice in a plastic bag and place a towel between the bag of ice and your skin. This is especially helpful if it can be done after all activities involving the sore wrist.  Temporary splinting may help.  Only take over-the-counter or prescription medicines for pain, discomfort or fever as directed by your caregiver. SEEK MEDICAL CARE IF:   Pain relief is not obtained with medications, or if you have increasing pain and seem to be getting worse rather than better. MAKE SURE YOU:   Understand these instructions.  Will watch your condition.  Will get help right away if you are not doing well or get worse. Document Released: 11/21/2000 Document Revised: 05/21/2011 Document Reviewed: 07/01/2013 [employer] Patient Information 2015 [employer]. This information is not intended to replace advice given to you by your health care provider. Make sure you discuss any questions you have with your health care provider.  

## 2013-11-18 NOTE — ED Notes (Signed)
Pt  Reports  Pain   r  Arm      Since  Last   Pm     denys  Any  specefic  Injury         -

## 2013-11-18 NOTE — ED Provider Notes (Signed)
Chief Complaint   Chief Complaint  Patient presents with  . Wrist Pain    History of Present Illness   Autumn Green is a 53 year old female who has a two-day history of pain over the radial right wrist. She denies any injury. This is described as an ache and radiates into the forearm. It's slightly swollen. Her arm feels weak, and she's had some numbness and tingling of the thumb and index finger.  Review of Systems   Other than as noted above, the patient denies any of the following symptoms: Systemic:  No fevers or chills. Musculoskeletal:  No joint pain or arthritis.  Neurological:  No muscular weakness or paresthesias.  PMFSH   Past medical history, family history, social history, meds, and allergies were reviewed.   She is allergic to gabapentin. She uses a hormonal patch, Synthroid, hydrochlorothiazide, and atenolol. She has hypertension, hypothyroidism, elevated cholesterol.  Physical Examination   Vital signs:  BP 165/72  Pulse 60  Temp(Src) 98.1 F (36.7 C) (Oral)  Resp 12  SpO2 100% Gen:  Alert and in no distress. Musculoskeletal:  Exam of the hand reveals tenderness to palpation over the radial styloid and over the extensor tendon of the thumb with a positive Finkelstein's test.  Otherwise, all joints had a full a ROM with no swelling, bruising or deformity.  No edema, pulses full. Extremities were warm and pink.  Capillary refill was brisk.  Skin:  Clear, warm and dry.  No rash. Neuro:  Alert and oriented.  Muscle strength was normal.  Sensation was intact to light touch.   Procedure Note:  Verbal informed consent was obtained from the patient.  Risks and benefits were outlined with the patient.  Patient understands and accepts these risks. A time out was called and the name of the procedure, the procedure site, and identity of the patient were confirmed verbally and by wristband.    The procedure was then performed as follows:  The point of insertion over the the  radial styloid was marked, then prepped with Betadine and alcohol and anesthetized with ethyl chloride spray. Using a 1/2 inch 30-gauge needle, 1 cc of Depo-Medrol 40 mg strength and 1 cc of 2% Xylocaine were injected overlying the extensor tendon of the thumb. A Band-Aid dressing was applied.  The patient tolerated the procedure well without any immediate complications.   Course in Urgent Care Center   She was placed in a thumb spica.  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  Plan  1.  Meds:  The following meds were prescribed:   Discharge Medication List as of 11/18/2013  8:45 AM    START taking these medications   Details  HYDROcodone-acetaminophen (NORCO/VICODIN) 5-325 MG per tablet 1 to 2 tabs every 4 to 6 hours as needed for pain., Print    meloxicam (MOBIC) 15 MG tablet Take 1 tablet (15 mg total) by mouth daily., Starting 11/18/2013, Until Discontinued, Normal        2.  Patient Education/Counseling:  The patient was given appropriate handouts, self care instructions, and instructed in symptomatic relief, including rest and activity, and elevation.   3.  Follow up:  The patient was told to follow up here if no better in 3 to 4 days, or sooner if becoming worse in any way, and given some red flag symptoms such as worsening pain, fever, swelling, or neurological symptoms which would prompt immediate return.  Followup with hand surgery if no improvement after  a week.      Reuben Likes, MD 11/18/13 2108

## 2014-02-18 ENCOUNTER — Encounter (HOSPITAL_COMMUNITY): Payer: Self-pay | Admitting: Cardiology

## 2014-03-11 ENCOUNTER — Encounter (HOSPITAL_COMMUNITY): Payer: Self-pay | Admitting: Emergency Medicine

## 2014-03-11 ENCOUNTER — Emergency Department (INDEPENDENT_AMBULATORY_CARE_PROVIDER_SITE_OTHER)
Admission: EM | Admit: 2014-03-11 | Discharge: 2014-03-11 | Disposition: A | Discharge status: Home / Self Care | Payer: Federal, State, Local not specified - PPO | Source: Home / Self Care | Attending: Emergency Medicine | Admitting: Emergency Medicine

## 2014-03-11 DIAGNOSIS — N390 Urinary tract infection, site not specified: Secondary | ICD-10-CM

## 2014-03-11 DIAGNOSIS — K529 Noninfective gastroenteritis and colitis, unspecified: Secondary | ICD-10-CM

## 2014-03-11 DIAGNOSIS — B349 Viral infection, unspecified: Secondary | ICD-10-CM

## 2014-03-11 DIAGNOSIS — J069 Acute upper respiratory infection, unspecified: Secondary | ICD-10-CM

## 2014-03-11 LAB — POCT URINALYSIS DIP (DEVICE)
BILIRUBIN URINE: NEGATIVE
Glucose, UA: 100 mg/dL — AB
KETONES UR: NEGATIVE mg/dL
Leukocytes, UA: NEGATIVE
Nitrite: NEGATIVE
PH: 5.5 (ref 5.0–8.0)
PROTEIN: NEGATIVE mg/dL
Specific Gravity, Urine: 1.02 (ref 1.005–1.030)
Urobilinogen, UA: 0.2 mg/dL (ref 0.0–1.0)

## 2014-03-11 MED ORDER — CEPHALEXIN 500 MG PO CAPS: 500.0000 mg | ORAL_CAPSULE | Freq: Four times a day (QID) | ORAL | Status: DC

## 2014-03-11 MED ORDER — ONDANSETRON HCL 4 MG PO TABS: 4.0000 mg | ORAL_TABLET | Freq: Four times a day (QID) | ORAL | Status: DC

## 2014-03-11 NOTE — ED Provider Notes (Signed)
CSN: [id number]     Arrival date & time 03/11/14  1638 History   First MD Initiated Contact with Patient 03/11/14 1658     Chief Complaint  Patient presents with  . Urinary Tract Infection   (Consider location/radiation/quality/duration/timing/severity/associated sxs/prior Treatment) HPI Comments: 53 year old female who developed headache, chills, nausea, cramping across the lower abdomen this morning. She also developed urinary frequency and urgency. Denies dysuria. Patient has a recent history of enlarged hemorrhoids for which she was using a crane to reduce it. Last week she had diarrhea but today she has had normal stools. No vomiting.   Past Medical History  Diagnosis Date  . Hypertension     about 10 years, well controlled with meds  . High cholesterol   . 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 Hypothyroidism     about 5 years on oral meds  . Spondylosis of lumbar joint    Past Surgical History  Procedure Laterality Date  . Breast surgery      had 5 or 6 cyst removed from breast, starting 1979  . Abdominal hysterectomy      2004  . Back surgery      2009 dicsectomy  . Lumbar disc surgery  07/17/2011    decompression  . Left heart catheterization with coronary angiogram N/A 10/14/2012    Procedure: LEFT HEART CATHETERIZATION WITH CORONARY ANGIOGRAM;  Surgeon: Pamella PertJagadeesh R Ganji, MD;  [facility];  Service: Cardiovascular;  Laterality: N/A;   Family History  Problem Relation Age of Onset  . Hypotension Neg Hx   . Malignant hyperthermia Neg Hx   . Pseudochol deficiency Neg Hx    History  Substance Use Topics  . Smoking status: Never Smoker   . Smokeless tobacco: Never Used  . Alcohol Use: No   OB History    No data available     Review of Systems  Constitutional: Positive for fever, chills, activity change, appetite change and fatigue.  HENT: Positive for congestion, postnasal drip and rhinorrhea. Negative for ear pain and sore throat.   Eyes:  Negative.   Respiratory: Positive for cough. Negative for shortness of breath and wheezing.   Cardiovascular: Negative for chest pain and leg swelling.  Gastrointestinal: Positive for nausea and abdominal pain. Negative for vomiting.  Genitourinary: Positive for urgency and frequency. Negative for dysuria, vaginal discharge and vaginal pain.       The patient describes a history of chronic lower right pelvic pain.  Neurological: Negative.     Allergies  Gabapentin  Home Medications   Prior to Admission medications   Medication Sig Start Date End Date Taking? Authorizing Provider  albuterol (PROVENTIL HFA;VENTOLIN HFA) 108 (90 BASE) MCG/ACT inhaler Inhale 1-2 puffs into the lungs every 6 (six) hours as needed. For shortness of breath/wheezing 03/20/11   Reuben Likesavid C Keller, MD  atenolol (TENORMIN) 25 MG tablet Take 25 mg by mouth daily.    Historical Provider, MD  cephALEXin (KEFLEX) 500 MG capsule Take 1 capsule (500 mg total) by mouth 4 (four) times daily. 03/11/14   Hayden Rasmussenavid Arelis Neumeier, NP  cycloSPORINE (RESTASIS) 0.05 % ophthalmic emulsion Place 1 drop into both eyes 2 (two) times daily.    Historical Provider, MD  hydrochlorothiazide (MICROZIDE) 12.5 MG capsule Take 25 mg by mouth daily.     Historical Provider, MD  HYDROcodone-acetaminophen (NORCO/VICODIN) 5-325 MG per tablet 1 to 2 tabs every 4 to 6 hours as needed for pain. 11/18/13   Reuben Likesavid C Keller,  MD  levothyroxine (SYNTHROID, LEVOTHROID) 75 MCG tablet Take 75 mcg by mouth daily before breakfast.    Historical Provider, MD  meloxicam (MOBIC) 15 MG tablet Take 1 tablet (15 mg total) by mouth daily. 11/18/13   Reuben Likesavid C Keller, MD  Multiple Vitamin (MULITIVITAMIN WITH MINERALS) TABS Take 1 tablet by mouth daily.    Historical Provider, MD  ondansetron (ZOFRAN) 4 MG tablet Take 1 tablet (4 mg total) by mouth every 6 (six) hours. 03/11/14   Hayden Rasmussenavid Mckinsley Koelzer, NP   BP 130/83 mmHg  Pulse 87  Temp(Src) 99.7 F (37.6 C) (Oral)  Resp 16  SpO2 97% Physical Exam   Constitutional: She is oriented to person, place, and time. She appears well-developed and well-nourished. No distress.  HENT:  Mouth/Throat: No oropharyngeal exudate.  Bilateral TMs are normal Oropharynx with clear PND .  Eyes: Conjunctivae and EOM are normal.  Neck: Normal range of motion. Neck supple.  Cardiovascular: Normal rate, regular rhythm, normal heart sounds and intact distal pulses.   Pulmonary/Chest: Effort normal and breath sounds normal. No respiratory distress. She has no wheezes. She has no rales.  Abdominal: Soft. Bowel sounds are normal. She exhibits no distension.  Tenderness across the lower abdomen bilaterally. No rebound or guarding no tenderness to the epigastrium or upper quadrants.  Lymphadenopathy:    She has no cervical adenopathy.  Neurological: She is alert and oriented to person, place, and time. She exhibits normal muscle tone.  Skin: Skin is warm and dry.  Psychiatric: She has a normal mood and affect.  Nursing note and vitals reviewed.   ED Course  Procedures (including critical care time) Labs Review Labs Reviewed  POCT URINALYSIS DIP (DEVICE) - Abnormal; Notable for the following:    Glucose, UA 100 (*)    Hgb urine dipstick TRACE (*)    All other components within normal limits  URINE CULTURE   Results for orders placed or performed during the hospital encounter of 03/11/14  POCT urinalysis dip (device)  Result Value Ref Range   Glucose, UA 100 (A) NEGATIVE mg/dL   Bilirubin Urine NEGATIVE NEGATIVE   Ketones, ur NEGATIVE NEGATIVE mg/dL   Specific Gravity, Urine 1.020 1.005 - 1.030   Hgb urine dipstick TRACE (A) NEGATIVE   pH 5.5 5.0 - 8.0   Protein, ur NEGATIVE NEGATIVE mg/dL   Urobilinogen, UA 0.2 0.0 - 1.0 mg/dL   Nitrite NEGATIVE NEGATIVE   Leukocytes, UA NEGATIVE NEGATIVE    Imaging Review No results found.   MDM   1. URI (upper respiratory infection)   2. Viral syndrome   3. Gastroenteritis   4. UTI (lower urinary tract  infection)    The patient has URI type symptoms in addition to other viral syndrome to include headache, chills, malaise and a GI symptoms. She does not have an acute abdomen. The urinary tract symptoms began this morning and her urinalysis is essentially clear. She does have urinary frequency stating that she has to urinate approximately every 5 minutes and a strong urgency to void. We will go ahead and treat with Keflex. She will continue with clear liquids primarily and treat her nausea with Zofran. For any worsening new symptoms or problems as discussed and return or go to the emergency department.    Hayden Rasmussenavid Taura Lamarre, NP 03/11/14 1740

## 2014-03-11 NOTE — ED Notes (Signed)
Chills headache, abdominal cramping, nauseated and frequent urination

## 2014-03-11 NOTE — Discharge Instructions (Signed)
Upper Respiratory Infection, Adult Sudafed PE for congestion Claritin for drainage  Upper Respiratory Infection, Adult An upper respiratory infection (URI) is also sometimes known as the common cold. The upper respiratory tract includes the nose, sinuses, throat, trachea, and bronchi. Bronchi are the airways leading to the lungs. Most people improve within 1 week, but symptoms can last up to 2 weeks. A residual cough may last even longer.  CAUSES Many different viruses can infect the tissues lining the upper respiratory tract. The tissues become irritated and inflamed and often become very moist. Mucus production is also common. A cold is contagious. You can easily spread the virus to others by oral contact. This includes kissing, sharing a glass, coughing, or sneezing. Touching your mouth or nose and then touching a surface, which is then touched by another person, can also spread the virus. SYMPTOMS  Symptoms typically develop 1 to 3 days after you come in contact with a cold virus. Symptoms vary from person to person. They may include:  Runny nose.  Sneezing.  Nasal congestion.  Sinus irritation.  Sore throat.  Loss of voice (laryngitis).  Cough.  Fatigue.  Muscle aches.  Loss of appetite.  Headache.  Low-grade fever. DIAGNOSIS  You might diagnose your own cold based on familiar symptoms, since most people get a cold 2 to 3 times a year. Your caregiver can confirm this based on your exam. Most importantly, your caregiver can check that your symptoms are not due to another disease such as strep throat, sinusitis, pneumonia, asthma, or epiglottitis. Blood tests, throat tests, and X-rays are not necessary to diagnose a common cold, but they may sometimes be helpful in excluding other more serious diseases. Your caregiver will decide if any further tests are required. RISKS AND COMPLICATIONS  You may be at risk for a more severe case of the common cold if you smoke cigarettes,  have chronic heart disease (such as heart failure) or lung disease (such as asthma), or if you have a weakened immune system. The very young and very old are also at risk for more serious infections. Bacterial sinusitis, middle ear infections, and bacterial pneumonia can complicate the common cold. The common cold can worsen asthma and chronic obstructive pulmonary disease (COPD). Sometimes, these complications can require emergency medical care and may be life-threatening. PREVENTION  The best way to protect against getting a cold is to practice good hygiene. Avoid oral or hand contact with people with cold symptoms. Wash your hands often if contact occurs. There is no clear evidence that vitamin C, vitamin E, echinacea, or exercise reduces the chance of developing a cold. However, it is always recommended to get plenty of rest and practice good nutrition. TREATMENT  Treatment is directed at relieving symptoms. There is no cure. Antibiotics are not effective, because the infection is caused by a virus, not by bacteria. Treatment may include:  Increased fluid intake. Sports drinks offer valuable electrolytes, sugars, and fluids.  Breathing heated mist or steam (vaporizer or shower).  Eating chicken soup or other clear broths, and maintaining good nutrition.  Getting plenty of rest.  Using gargles or lozenges for comfort.  Controlling fevers with ibuprofen or acetaminophen as directed by your caregiver.  Increasing usage of your inhaler if you have asthma. Zinc gel and zinc lozenges, taken in the first 24 hours of the common cold, can shorten the duration and lessen the severity of symptoms. Pain medicines may help with fever, muscle aches, and throat pain. A variety of  non-prescription medicines are available to treat congestion and runny nose. Your caregiver can make recommendations and may suggest nasal or lung inhalers for other symptoms.  HOME CARE INSTRUCTIONS   Only take over-the-counter  or prescription medicines for pain, discomfort, or fever as directed by your caregiver.  Use a warm mist humidifier or inhale steam from a shower to increase air moisture. This may keep secretions moist and make it easier to breathe.  Drink enough water and fluids to keep your urine clear or pale yellow.  Rest as needed.  Return to work when your temperature has returned to normal or as your caregiver advises. You may need to stay home longer to avoid infecting others. You can also use a face mask and careful hand washing to prevent spread of the virus. SEEK MEDICAL CARE IF:   After the first few days, you feel you are getting worse rather than better.  You need your caregiver's advice about medicines to control symptoms.  You develop chills, worsening shortness of breath, or brown or red sputum. These may be signs of pneumonia.  You develop yellow or brown nasal discharge or pain in the face, especially when you bend forward. These may be signs of sinusitis.  You develop a fever, swollen neck glands, pain with swallowing, or white areas in the back of your throat. These may be signs of strep throat. SEEK IMMEDIATE MEDICAL CARE IF:   You have a fever.  You develop severe or persistent headache, ear pain, sinus pain, or chest pain.  You develop wheezing, a prolonged cough, cough up blood, or have a change in your usual mucus (if you have chronic lung disease).  You develop sore muscles or a stiff neck. Document Released: 08/22/2000 Document Revised: 05/21/2011 Document Reviewed: 06/03/2013 [facility] Patient Information 2015 [place], [place]. This information is not intended to replace advice given to you by your health care provider. Make sure you discuss any questions you have with your health care provider.  Urinary Tract Infection A urinary tract infection (UTI) can occur any place along the urinary tract. The tract includes the kidneys, ureters, bladder, and urethra. A type of germ  called bacteria often causes a UTI. UTIs are often helped with antibiotic medicine.  HOME CARE   If given, take antibiotics as told by your doctor. Finish them even if you start to feel better.  Drink enough fluids to keep your pee (urine) clear or pale yellow.  Avoid tea, drinks with caffeine, and bubbly (carbonated) drinks.  Pee often. Avoid holding your pee in for a long time.  Pee before and after having sex (intercourse).  Wipe from front to back after you poop (bowel movement) if you are a woman. Use each tissue only once. GET HELP RIGHT AWAY IF:   You have back pain.  You have lower belly (abdominal) pain.  You have chills.  You feel sick to your stomach (nauseous).  You throw up (vomit).  Your burning or discomfort with peeing does not go away.  You have a fever.  Your symptoms are not better in 3 days. MAKE SURE YOU:   Understand these instructions.  Will watch your condition.  Will get help right away if you are not doing well or get worse. Document Released: 08/15/2007 Document Revised: 11/21/2011 Document Reviewed: 09/27/2011 [facility] LP[facility] Patient Information 2015 [place], [place]. This information is not intended to replace advice given to you by your health care provider. Make sure you discuss any questions you have with your  health care provider. ° ° °

## 2014-03-14 LAB — URINE CULTURE: SPECIAL REQUESTS: NORMAL

## 2014-05-06 ENCOUNTER — Other Ambulatory Visit: Payer: Self-pay | Admitting: Neurosurgery

## 2014-05-06 DIAGNOSIS — M48061 Spinal stenosis, lumbar region without neurogenic claudication: Secondary | ICD-10-CM

## 2014-05-13 ENCOUNTER — Other Ambulatory Visit: Payer: Self-pay

## 2014-05-14 ENCOUNTER — Ambulatory Visit
Admission: RE | Admit: 2014-05-14 | Discharge: 2014-05-14 | Disposition: A | Discharge status: Home / Self Care | Payer: Worker's Compensation | Source: Ambulatory Visit | Attending: Neurosurgery | Admitting: Neurosurgery

## 2014-05-14 DIAGNOSIS — M48061 Spinal stenosis, lumbar region without neurogenic claudication: Secondary | ICD-10-CM

## 2014-08-19 ENCOUNTER — Other Ambulatory Visit: Payer: Self-pay | Admitting: Gastroenterology

## 2014-08-19 DIAGNOSIS — R110 Nausea: Secondary | ICD-10-CM

## 2014-08-27 ENCOUNTER — Ambulatory Visit (HOSPITAL_COMMUNITY)
Admission: RE | Admit: 2014-08-27 | Discharge: 2014-08-27 | Disposition: A | Discharge status: Home / Self Care | Payer: Federal, State, Local not specified - PPO | Source: Ambulatory Visit | Attending: Gastroenterology | Admitting: Gastroenterology

## 2014-08-27 DIAGNOSIS — R110 Nausea: Secondary | ICD-10-CM | POA: Diagnosis not present

## 2014-08-27 DIAGNOSIS — R109 Unspecified abdominal pain: Secondary | ICD-10-CM | POA: Diagnosis not present

## 2014-09-03 ENCOUNTER — Ambulatory Visit (HOSPITAL_COMMUNITY): Payer: Federal, State, Local not specified - PPO

## 2014-09-14 ENCOUNTER — Ambulatory Visit (HOSPITAL_COMMUNITY): Payer: Federal, State, Local not specified - PPO

## 2015-01-26 ENCOUNTER — Encounter (HOSPITAL_COMMUNITY): Payer: Self-pay | Admitting: Emergency Medicine

## 2015-01-26 ENCOUNTER — Emergency Department (INDEPENDENT_AMBULATORY_CARE_PROVIDER_SITE_OTHER): Payer: Federal, State, Local not specified - PPO

## 2015-01-26 ENCOUNTER — Emergency Department (INDEPENDENT_AMBULATORY_CARE_PROVIDER_SITE_OTHER)
Admission: EM | Admit: 2015-01-26 | Discharge: 2015-01-26 | Disposition: A | Discharge status: Home / Self Care | Payer: Federal, State, Local not specified - PPO | Source: Home / Self Care | Attending: Emergency Medicine | Admitting: Emergency Medicine

## 2015-01-26 DIAGNOSIS — R05 Cough: Secondary | ICD-10-CM

## 2015-01-26 DIAGNOSIS — R51 Headache: Secondary | ICD-10-CM | POA: Diagnosis not present

## 2015-01-26 DIAGNOSIS — R0789 Other chest pain: Secondary | ICD-10-CM

## 2015-01-26 DIAGNOSIS — R519 Headache, unspecified: Secondary | ICD-10-CM

## 2015-01-26 DIAGNOSIS — R059 Cough, unspecified: Secondary | ICD-10-CM

## 2015-01-26 MED ORDER — ALBUTEROL SULFATE (2.5 MG/3ML) 0.083% IN NEBU
INHALATION_SOLUTION | RESPIRATORY_TRACT | Status: AC
  Filled 2015-01-26: qty 3

## 2015-01-26 MED ORDER — KETOROLAC TROMETHAMINE 60 MG/2ML IM SOLN
60.0000 mg | Freq: Once | INTRAMUSCULAR | Status: AC
  Administered 2015-01-26: 60 mg via INTRAMUSCULAR

## 2015-01-26 MED ORDER — AZITHROMYCIN 250 MG PO TABS: ORAL_TABLET | ORAL | Status: DC

## 2015-01-26 MED ORDER — KETOROLAC TROMETHAMINE 60 MG/2ML IM SOLN
INTRAMUSCULAR | Status: AC
  Filled 2015-01-26: qty 2

## 2015-01-26 MED ORDER — ALBUTEROL SULFATE (2.5 MG/3ML) 0.083% IN NEBU
2.5000 mg | INHALATION_SOLUTION | Freq: Once | RESPIRATORY_TRACT | Status: AC
  Administered 2015-01-26: 2.5 mg via RESPIRATORY_TRACT

## 2015-01-26 NOTE — ED Notes (Signed)
[facility] a [facility] pain and tightness that has been ongoing for 1 month. The patient also stated that over the last 2 days she has been having a cough, headache and nausea.

## 2015-01-26 NOTE — ED Provider Notes (Signed)
CSN: [id number]     Arrival date & time 01/26/15  1754 History   First MD Initiated Contact with Patient 01/26/15 1830     Chief Complaint  Patient presents with  . Chest Pain  . Shortness of Breath   (Consider location/radiation/quality/duration/timing/severity/associated sxs/prior Treatment) HPI  She is a 54 year old woman here for evaluation of chest tightness and shortness of breath. She states for the last month she has had a tight feeling in her central chest. This is constant. Nothing makes it better or worse. She also reports feeling short of breath with it. Over the last few days, she has developed a nonproductive cough. She denies any fevers, but does endorse chills. No nasal congestion, rhinorrhea, sore throat. In the last 2-3 days she has developed a diffuse throbbing headache associated with nausea. No associated photophobia or phonophobia. She states the headache makes her eyes hurt.  She does have acid reflux and is on a medication for this.  Past Medical History  Diagnosis Date  . Hypertension     about 10 years, well controlled with meds  . High cholesterol   . 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 Hypothyroidism     about 5 years on oral meds  . Spondylosis of lumbar joint    Past Surgical History  Procedure Laterality Date  . Breast surgery      had 5 or 6 cyst removed from breast, starting 1979  . Abdominal hysterectomy      2004  . Back surgery      2009 dicsectomy  . Lumbar disc surgery  07/17/2011    decompression  . Left heart catheterization with coronary angiogram N/A 10/14/2012    Procedure: LEFT HEART CATHETERIZATION WITH CORONARY ANGIOGRAM;  Surgeon: Pamella PertJagadeesh R Ganji, MD;  [facility];  Service: Cardiovascular;  Laterality: N/A;   Family History  Problem Relation Age of Onset  . Hypotension Neg Hx   . Malignant hyperthermia Neg Hx   . Pseudochol deficiency Neg Hx    Social History  Substance Use Topics  . Smoking status: Never  Smoker   . Smokeless tobacco: Never Used  . Alcohol Use: No   OB History    No data available     Review of Systems As in history of present illness Allergies  Gabapentin  Home Medications   Prior to Admission medications   Medication Sig Start Date End Date Taking? Authorizing Provider  albuterol (PROVENTIL HFA;VENTOLIN HFA) 108 (90 BASE) MCG/ACT inhaler Inhale 1-2 puffs into the lungs every 6 (six) hours as needed. For shortness of breath/wheezing 03/20/11   Reuben Likesavid C Keller, MD  atenolol (TENORMIN) 25 MG tablet Take 25 mg by mouth daily.    Historical Provider, MD  azithromycin (ZITHROMAX Z-PAK) 250 MG tablet Take 2 pills today, then 1 pill daily until gone. 01/26/15   Charm RingsErin J Caillou Minus, MD  cycloSPORINE (RESTASIS) 0.05 % ophthalmic emulsion Place 1 drop into both eyes 2 (two) times daily.    Historical Provider, MD  hydrochlorothiazide (MICROZIDE) 12.5 MG capsule Take 25 mg by mouth daily.     Historical Provider, MD  HYDROcodone-acetaminophen (NORCO/VICODIN) 5-325 MG per tablet 1 to 2 tabs every 4 to 6 hours as needed for pain. 11/18/13   Reuben Likesavid C Keller, MD  levothyroxine (SYNTHROID, LEVOTHROID) 75 MCG tablet Take 75 mcg by mouth daily before breakfast.    Historical Provider, MD  meloxicam (MOBIC) 15 MG tablet Take 1 tablet (15 mg total) by  mouth daily. 11/18/13   Reuben Likes, MD  Multiple Vitamin (MULITIVITAMIN WITH MINERALS) TABS Take 1 tablet by mouth daily.    Historical Provider, MD  ondansetron (ZOFRAN) 4 MG tablet Take 1 tablet (4 mg total) by mouth every 6 (six) hours. 03/11/14   Hayden Rasmussen, NP   Meds Ordered and Administered this Visit   Medications  ketorolac (TORADOL) injection 60 mg (not administered)  albuterol (PROVENTIL) (2.5 MG/3ML) 0.083% nebulizer solution 2.5 mg (2.5 mg Nebulization Given 01/26/15 1848)    BP 123/63 mmHg  Pulse 82  Temp(Src) 99.2 F (37.3 C) (Oral)  Resp 20  SpO2 98% No data found.   Physical Exam  Constitutional: She is oriented to person,  place, and time. She appears well-developed and well-nourished. No distress.  HENT:  Mouth/Throat: Oropharynx is clear and moist.  Eyes: Conjunctivae are normal. Pupils are equal, round, and reactive to light.  Neck: Neck supple.  Cardiovascular: Normal rate, regular rhythm and normal heart sounds.   No murmur heard. Pulmonary/Chest: Effort normal and breath sounds normal. No respiratory distress. She has no wheezes. She has no rales.  Neurological: She is alert and oriented to person, place, and time.  Skin: Skin is warm and dry.    ED Course  Procedures (including critical care time) ED ECG REPORT   Date: 01/26/2015  Rate: 81  Rhythm: normal sinus rhythm  QRS Axis: normal  Intervals: normal  ST/T Wave abnormalities: normal  Conduction Disutrbances:none  Narrative Interpretation: normal ekg  Old EKG Reviewed: unchanged  I have personally reviewed the EKG tracing and agree with the computerized printout as noted.  Labs Review Labs Reviewed - No data to display  Imaging Review Dg Chest 2 View  01/26/2015  CLINICAL DATA:  Chronic chest tightness, cough and shortness of breath. Initial encounter. EXAM: CHEST  2 VIEW COMPARISON:  Chest radiograph and CTA of the chest performed 08/11/2013 FINDINGS: The lungs are well-aerated and clear. There is no evidence of focal opacification, pleural effusion or pneumothorax. Nodularity is noted at the right lung apex, reflecting chronic scarring. The heart is normal in size; the mediastinal contour is within normal limits. No acute osseous abnormalities are seen. IMPRESSION: No acute cardiopulmonary process seen. Electronically Signed   By: Roanna Raider M.D.   On: 01/26/2015 18:59      MDM   1. Chest tightness   2. Cough   3. Headache, unspecified headache type    She reports some improvement after the albuterol treatment. We'll treat as a bronchitis with azithromycin and regular use of albuterol over the next several days. If she is  not improving over the next few days, she will follow-up with her primary care doctor. I suspect her headache is a migraine triggered by the cough and chest tightness. Toradol given here.    Charm Rings, MD 01/26/15 [phone]

## 2015-01-26 NOTE — Discharge Instructions (Signed)
I think you may have bronchitis causing your chest tightness and cough. Take azithromycin as prescribed. Please use your albuterol inhaler every 4 hours for the next 2-3 days. If things are not improving, please follow-up with your primary care doctor to discuss referral to pulmonology. If your symptoms worsen, please go to the emergency room. We gave you a shot today to help with your headache.

## 2015-02-06 ENCOUNTER — Encounter (HOSPITAL_COMMUNITY): Payer: Self-pay | Admitting: *Deleted

## 2015-02-06 ENCOUNTER — Emergency Department (INDEPENDENT_AMBULATORY_CARE_PROVIDER_SITE_OTHER)
Admission: EM | Admit: 2015-02-06 | Discharge: 2015-02-06 | Disposition: A | Discharge status: Home / Self Care | Payer: Federal, State, Local not specified - PPO | Source: Home / Self Care | Attending: Family Medicine | Admitting: Family Medicine

## 2015-02-06 DIAGNOSIS — J0101 Acute recurrent maxillary sinusitis: Secondary | ICD-10-CM

## 2015-02-06 MED ORDER — DOXYCYCLINE HYCLATE 100 MG PO CAPS: 100.0000 mg | ORAL_CAPSULE | Freq: Two times a day (BID) | ORAL | Status: DC

## 2015-02-06 MED ORDER — IPRATROPIUM BROMIDE 0.06 % NA SOLN: 2.0000 | Freq: Four times a day (QID) | NASAL | Status: DC

## 2015-02-06 NOTE — ED Notes (Signed)
Pt  Reports   Symptoms   Of cough   /  Congestion     Pain  And  Tightness  In her  Chest          She  Was  Seen   About   12 days  Ago       And  Was  rx  Anti biotics   And  Had  Chest  X  Ray  And ekg   Done

## 2015-02-06 NOTE — Discharge Instructions (Signed)
Drink plenty of fluids as discussed, use medicine as prescribed, and mucinex or delsym for cough. Return or see your doctor if further problemsDrink plenty of fluids as discussed, use medicine as prescribed, and mucinex or delsym for cough. Return or see your doctor if further problems

## 2015-02-06 NOTE — ED Provider Notes (Signed)
CSN: [id number]     Arrival date & time 02/06/15  1320 History   First MD Initiated Contact with Patient 02/06/15 1434     Chief Complaint  Patient presents with  . Cough   (Consider location/radiation/quality/duration/timing/severity/associated sxs/prior Treatment) Patient is a 54 y.o. female presenting with cough. The history is provided by the patient.  Cough Cough characteristics:  Productive Sputum characteristics:  Yellow Severity:  Mild Onset quality:  Gradual Duration:  3 weeks Progression:  Unchanged Chronicity:  New Smoker: no   Context: upper respiratory infection and weather changes   Context comment:  Seen 11/16 and treated but cough and cp continue. Associated symptoms: chest pain, rhinorrhea, sinus congestion and sore throat   Associated symptoms: no fever   Risk factors: recent infection     Past Medical History  Diagnosis Date  . Hypertension     about 10 years, well controlled with meds  . High cholesterol   . 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 Hypothyroidism     about 5 years on oral meds  . Spondylosis of lumbar joint    Past Surgical History  Procedure Laterality Date  . Breast surgery      had 5 or 6 cyst removed from breast, starting 1979  . Abdominal hysterectomy      2004  . Back surgery      2009 dicsectomy  . Lumbar disc surgery  07/17/2011    decompression  . Left heart catheterization with coronary angiogram N/A 10/14/2012    Procedure: LEFT HEART CATHETERIZATION WITH CORONARY ANGIOGRAM;  Surgeon: Pamella Pert, MD;  Location: [facility] CATH LAB;  Service: Cardiovascular;  Laterality: N/A;   Family History  Problem Relation Age of Onset  . Hypotension Neg Hx   . Malignant hyperthermia Neg Hx   . Pseudochol deficiency Neg Hx    Social History  Substance Use Topics  . Smoking status: Never Smoker   . Smokeless tobacco: Never Used  . Alcohol Use: No   OB History    No data available     Review of Systems  Constitutional:  Negative.  Negative for fever.  HENT: Positive for congestion, postnasal drip, rhinorrhea and sore throat.   Respiratory: Positive for cough.   Cardiovascular: Positive for chest pain. Negative for palpitations and leg swelling.  All other systems reviewed and are negative.   Allergies  Gabapentin  Home Medications   Prior to Admission medications   Medication Sig Start Date End Date Taking? Authorizing Provider  albuterol (PROVENTIL HFA;VENTOLIN HFA) 108 (90 BASE) MCG/ACT inhaler Inhale 1-2 puffs into the lungs every 6 (six) hours as needed. For shortness of breath/wheezing 03/20/11   Reuben Likes, MD  atenolol (TENORMIN) 25 MG tablet Take 25 mg by mouth daily.    Historical Provider, MD  azithromycin (ZITHROMAX Z-PAK) 250 MG tablet Take 2 pills today, then 1 pill daily until gone. 01/26/15   Charm Rings, MD  cycloSPORINE (RESTASIS) 0.05 % ophthalmic emulsion Place 1 drop into both eyes 2 (two) times daily.    Historical Provider, MD  doxycycline (VIBRAMYCIN) 100 MG capsule Take 1 capsule (100 mg total) by mouth 2 (two) times daily. 02/06/15   Linna Hoff, MD  hydrochlorothiazide (MICROZIDE) 12.5 MG capsule Take 25 mg by mouth daily.     Historical Provider, MD  HYDROcodone-acetaminophen (NORCO/VICODIN) 5-325 MG per tablet 1 to 2 tabs every 4 to 6 hours as needed for pain. 11/18/13   Dineen Kid  Lorenz CoasterKeller, MD  ipratropium (ATROVENT) 0.06 % nasal spray Place 2 sprays into both nostrils 4 (four) times daily. 02/06/15   Linna HoffJames D Kindl, MD  levothyroxine (SYNTHROID, LEVOTHROID) 75 MCG tablet Take 75 mcg by mouth daily before breakfast.    Historical Provider, MD  meloxicam (MOBIC) 15 MG tablet Take 1 tablet (15 mg total) by mouth daily. 11/18/13   Reuben Likesavid C Keller, MD  Multiple Vitamin (MULITIVITAMIN WITH MINERALS) TABS Take 1 tablet by mouth daily.    Historical Provider, MD  ondansetron (ZOFRAN) 4 MG tablet Take 1 tablet (4 mg total) by mouth every 6 (six) hours. 03/11/14   Hayden Rasmussenavid Mabe, NP   Meds  Ordered and Administered this Visit  Medications - No data to display  BP 166/79 mmHg  Pulse 81  Temp(Src) 98.2 F (36.8 C) (Oral)  SpO2 98% No data found.   Physical Exam  Constitutional: She is oriented to person, place, and time. She appears well-developed and well-nourished. No distress.  HENT:  Head: Normocephalic.  Right Ear: External ear normal.  Left Ear: External ear normal.  Mouth/Throat: Oropharynx is clear and moist.  Eyes: Pupils are equal, round, and reactive to light.  Neck: Normal range of motion. Neck supple.  Cardiovascular: Normal heart sounds and intact distal pulses.   Pulmonary/Chest: Effort normal and breath sounds normal.  Lymphadenopathy:    She has no cervical adenopathy.  Neurological: She is alert and oriented to person, place, and time.  Skin: Skin is warm and dry.  Nursing note and vitals reviewed.   ED Course  Procedures (including critical care time)  Labs Review Labs Reviewed - No data to display  Imaging Review No results found.   Visual Acuity Review  Right Eye Distance:   Left Eye Distance:   Bilateral Distance:    Right Eye Near:   Left Eye Near:    Bilateral Near:         MDM   1. Acute recurrent maxillary sinusitis        Linna HoffJames D Kindl, MD 02/06/15 [phone]

## 2015-06-21 DIAGNOSIS — R945 Abnormal results of liver function studies: Secondary | ICD-10-CM | POA: Diagnosis not present

## 2015-06-21 DIAGNOSIS — M545 Low back pain: Secondary | ICD-10-CM | POA: Diagnosis not present

## 2015-06-21 DIAGNOSIS — R944 Abnormal results of kidney function studies: Secondary | ICD-10-CM | POA: Diagnosis not present

## 2015-06-21 DIAGNOSIS — R7303 Prediabetes: Secondary | ICD-10-CM | POA: Diagnosis not present

## 2015-06-21 DIAGNOSIS — I10 Essential (primary) hypertension: Secondary | ICD-10-CM | POA: Diagnosis not present

## 2015-06-21 DIAGNOSIS — E049 Nontoxic goiter, unspecified: Secondary | ICD-10-CM | POA: Diagnosis not present

## 2015-06-28 DIAGNOSIS — K219 Gastro-esophageal reflux disease without esophagitis: Secondary | ICD-10-CM | POA: Diagnosis not present

## 2015-06-28 DIAGNOSIS — R1013 Epigastric pain: Secondary | ICD-10-CM | POA: Diagnosis not present

## 2015-06-28 DIAGNOSIS — Z800 Family history of malignant neoplasm of digestive organs: Secondary | ICD-10-CM | POA: Diagnosis not present

## 2015-06-28 DIAGNOSIS — K625 Hemorrhage of anus and rectum: Secondary | ICD-10-CM | POA: Diagnosis not present

## 2015-07-04 DIAGNOSIS — K219 Gastro-esophageal reflux disease without esophagitis: Secondary | ICD-10-CM | POA: Diagnosis not present

## 2015-07-04 DIAGNOSIS — K921 Melena: Secondary | ICD-10-CM | POA: Diagnosis not present

## 2015-07-04 DIAGNOSIS — R1033 Periumbilical pain: Secondary | ICD-10-CM | POA: Diagnosis not present

## 2015-07-19 DIAGNOSIS — K219 Gastro-esophageal reflux disease without esophagitis: Secondary | ICD-10-CM | POA: Diagnosis not present

## 2015-07-19 DIAGNOSIS — Z800 Family history of malignant neoplasm of digestive organs: Secondary | ICD-10-CM | POA: Diagnosis not present

## 2015-07-19 DIAGNOSIS — Z1211 Encounter for screening for malignant neoplasm of colon: Secondary | ICD-10-CM | POA: Diagnosis not present

## 2015-08-01 DIAGNOSIS — Z1211 Encounter for screening for malignant neoplasm of colon: Secondary | ICD-10-CM | POA: Diagnosis not present

## 2015-08-01 DIAGNOSIS — Z800 Family history of malignant neoplasm of digestive organs: Secondary | ICD-10-CM | POA: Diagnosis not present

## 2015-08-03 DIAGNOSIS — R7989 Other specified abnormal findings of blood chemistry: Secondary | ICD-10-CM | POA: Diagnosis not present

## 2015-08-03 DIAGNOSIS — E041 Nontoxic single thyroid nodule: Secondary | ICD-10-CM | POA: Diagnosis not present

## 2015-08-16 DIAGNOSIS — Z79899 Other long term (current) drug therapy: Secondary | ICD-10-CM | POA: Diagnosis not present

## 2015-08-16 DIAGNOSIS — K7689 Other specified diseases of liver: Secondary | ICD-10-CM | POA: Diagnosis not present

## 2015-08-16 DIAGNOSIS — K5790 Diverticulosis of intestine, part unspecified, without perforation or abscess without bleeding: Secondary | ICD-10-CM | POA: Diagnosis not present

## 2015-08-16 DIAGNOSIS — K219 Gastro-esophageal reflux disease without esophagitis: Secondary | ICD-10-CM | POA: Diagnosis not present

## 2015-08-16 DIAGNOSIS — E079 Disorder of thyroid, unspecified: Secondary | ICD-10-CM | POA: Diagnosis not present

## 2015-08-16 DIAGNOSIS — I10 Essential (primary) hypertension: Secondary | ICD-10-CM | POA: Diagnosis not present

## 2015-08-16 DIAGNOSIS — G8929 Other chronic pain: Secondary | ICD-10-CM | POA: Diagnosis not present

## 2015-08-16 DIAGNOSIS — N200 Calculus of kidney: Secondary | ICD-10-CM | POA: Diagnosis not present

## 2015-08-16 DIAGNOSIS — R1084 Generalized abdominal pain: Secondary | ICD-10-CM | POA: Diagnosis not present

## 2015-08-16 DIAGNOSIS — Z8711 Personal history of peptic ulcer disease: Secondary | ICD-10-CM | POA: Diagnosis not present

## 2015-08-18 DIAGNOSIS — M25551 Pain in right hip: Secondary | ICD-10-CM | POA: Diagnosis not present

## 2015-08-18 DIAGNOSIS — M542 Cervicalgia: Secondary | ICD-10-CM | POA: Diagnosis not present

## 2015-08-18 DIAGNOSIS — R350 Frequency of micturition: Secondary | ICD-10-CM | POA: Diagnosis not present

## 2015-08-18 DIAGNOSIS — R1011 Right upper quadrant pain: Secondary | ICD-10-CM | POA: Diagnosis not present

## 2015-08-18 DIAGNOSIS — R1031 Right lower quadrant pain: Secondary | ICD-10-CM | POA: Diagnosis not present

## 2015-08-18 DIAGNOSIS — E559 Vitamin D deficiency, unspecified: Secondary | ICD-10-CM | POA: Diagnosis not present

## 2015-08-26 ENCOUNTER — Other Ambulatory Visit (HOSPITAL_COMMUNITY): Payer: Self-pay | Admitting: Internal Medicine

## 2015-08-26 DIAGNOSIS — R1011 Right upper quadrant pain: Secondary | ICD-10-CM

## 2015-09-09 ENCOUNTER — Ambulatory Visit (HOSPITAL_COMMUNITY): Payer: Federal, State, Local not specified - PPO

## 2015-09-12 ENCOUNTER — Ambulatory Visit (HOSPITAL_COMMUNITY)
Admission: RE | Admit: 2015-09-12 | Discharge: 2015-09-12 | Disposition: A | Discharge status: Home / Self Care | Payer: Federal, State, Local not specified - PPO | Source: Ambulatory Visit | Attending: Internal Medicine | Admitting: Internal Medicine

## 2015-09-12 DIAGNOSIS — R1011 Right upper quadrant pain: Secondary | ICD-10-CM | POA: Insufficient documentation

## 2015-09-12 MED ORDER — TECHNETIUM TC 99M MEBROFENIN IV KIT
5.3000 | PACK | Freq: Once | INTRAVENOUS | Status: AC | PRN
  Administered 2015-09-12: 5.3 via INTRAVENOUS

## 2015-12-22 DIAGNOSIS — E7800 Pure hypercholesterolemia, unspecified: Secondary | ICD-10-CM | POA: Diagnosis not present

## 2015-12-22 DIAGNOSIS — Z23 Encounter for immunization: Secondary | ICD-10-CM | POA: Diagnosis not present

## 2015-12-22 DIAGNOSIS — E559 Vitamin D deficiency, unspecified: Secondary | ICD-10-CM | POA: Diagnosis not present

## 2015-12-22 DIAGNOSIS — I10 Essential (primary) hypertension: Secondary | ICD-10-CM | POA: Diagnosis not present

## 2015-12-22 DIAGNOSIS — M549 Dorsalgia, unspecified: Secondary | ICD-10-CM | POA: Diagnosis not present

## 2015-12-22 DIAGNOSIS — M5030 Other cervical disc degeneration, unspecified cervical region: Secondary | ICD-10-CM | POA: Diagnosis not present

## 2015-12-27 DIAGNOSIS — Z90710 Acquired absence of both cervix and uterus: Secondary | ICD-10-CM | POA: Diagnosis not present

## 2015-12-27 DIAGNOSIS — Z1231 Encounter for screening mammogram for malignant neoplasm of breast: Secondary | ICD-10-CM | POA: Diagnosis not present

## 2015-12-27 DIAGNOSIS — Z01419 Encounter for gynecological examination (general) (routine) without abnormal findings: Secondary | ICD-10-CM | POA: Diagnosis not present

## 2016-02-09 DIAGNOSIS — M542 Cervicalgia: Secondary | ICD-10-CM | POA: Diagnosis not present

## 2016-03-26 DIAGNOSIS — Z0000 Encounter for general adult medical examination without abnormal findings: Secondary | ICD-10-CM | POA: Diagnosis not present

## 2016-03-26 DIAGNOSIS — I10 Essential (primary) hypertension: Secondary | ICD-10-CM | POA: Diagnosis not present

## 2016-03-26 DIAGNOSIS — E039 Hypothyroidism, unspecified: Secondary | ICD-10-CM | POA: Diagnosis not present

## 2016-03-26 DIAGNOSIS — E559 Vitamin D deficiency, unspecified: Secondary | ICD-10-CM | POA: Diagnosis not present

## 2016-04-03 DIAGNOSIS — M542 Cervicalgia: Secondary | ICD-10-CM | POA: Diagnosis not present

## 2016-04-03 DIAGNOSIS — M5031 Other cervical disc degeneration,  high cervical region: Secondary | ICD-10-CM | POA: Diagnosis not present

## 2016-04-03 DIAGNOSIS — H524 Presbyopia: Secondary | ICD-10-CM | POA: Diagnosis not present

## 2016-04-03 DIAGNOSIS — H04123 Dry eye syndrome of bilateral lacrimal glands: Secondary | ICD-10-CM | POA: Diagnosis not present

## 2016-04-03 DIAGNOSIS — M50321 Other cervical disc degeneration at C4-C5 level: Secondary | ICD-10-CM | POA: Diagnosis not present

## 2016-04-03 DIAGNOSIS — H2513 Age-related nuclear cataract, bilateral: Secondary | ICD-10-CM | POA: Diagnosis not present

## 2016-04-03 DIAGNOSIS — L84 Corns and callosities: Secondary | ICD-10-CM | POA: Diagnosis not present

## 2016-04-03 DIAGNOSIS — M4802 Spinal stenosis, cervical region: Secondary | ICD-10-CM | POA: Diagnosis not present

## 2016-04-03 DIAGNOSIS — H25042 Posterior subcapsular polar age-related cataract, left eye: Secondary | ICD-10-CM | POA: Diagnosis not present

## 2016-04-16 NOTE — Progress Notes (Signed)
Cardiology Office Note   Date:  04/17/2016   ID:  Autumn Green, Autumn Green 03-24-1960, MRN [id number]  PCP:  Gwynneth Aliment, MD  Cardiologist:   Chilton Si, MD   No chief complaint on file.     History of Present Illness: Autumn Green is a 56 y.o. female with hypertension, hyperlipidemia, and hypothyroidism who presents for management of hyperlipidemia.  She reports 10 years of hyperlipidemia.  She has been unable to tolerate atorvastatin, rosuvastatin or pravastatin.  She was treated through the [place] and was most recently on cholestolpol.  She reported abdominal distension and bloating.  She stopped taking this medication over a month ago.  Autumn Green saw Dr. Dorothyann Peng 03/2016 and her LDL was 196.  She was referred to cardiology for further evaluation.   Autumn Green has been feeling well.  She denies chest pain, shortness of breath, lower extremity edema, orthopnea, or PND. She walks approximately 15 minutes every other day. She has rods in her back and is unable to exercise more. In the past she has had palpitations but none recently. She had an exercise stress echo at [facility] in 2014 that revealed normal systolic function and no ischemia. She reportedly had a heart catheterization with Dr. Nadara Eaton that was normal.  She notes that her father had several heart attacks but she is unsure of how old he was at the time. She denies any family history of sudden cardiac death or premature coronary artery disease.   Past Medical History:  Diagnosis Date  . High cholesterol   . Hyperlipidemia 04/17/2016  . Hypertension    about 10 years, well controlled with meds  . Hypothyroidism    about 5 years on oral meds  . Spondylosis of lumbar joint   . Tuberculosis    1983-1985, no recurrance    Past Surgical History:  Procedure Laterality Date  . ABDOMINAL HYSTERECTOMY     2004  . BACK SURGERY     2009 dicsectomy  . BREAST SURGERY     had 5 or 6 cyst removed from  breast, starting 1979  . LEFT HEART CATHETERIZATION WITH CORONARY ANGIOGRAM N/A 10/14/2012   Procedure: LEFT HEART CATHETERIZATION WITH CORONARY ANGIOGRAM;  Surgeon: Autumn Pert, MD;  Location: [facility] CATH LAB;  Service: Cardiovascular;  Laterality: N/A;  . LUMBAR DISC SURGERY  07/17/2011   decompression     Current Outpatient Prescriptions  Medication Sig Dispense Refill  . cycloSPORINE (RESTASIS) 0.05 % ophthalmic emulsion Place 1 drop into both eyes 2 (two) times daily.    Marland Kitchen diltiazem (DILACOR XR) 180 MG 24 hr capsule Take 180 mg by mouth daily.    . HydroCHLOROthiazide (MICROZIDE PO) Take 25 mg by mouth daily.    Marland Kitchen levothyroxine (SYNTHROID, LEVOTHROID) 75 MCG tablet Take 75 mcg by mouth daily before breakfast.    . meloxicam (MOBIC) 15 MG tablet Take 1 tablet (15 mg total) by mouth daily. 15 tablet 0  . omeprazole (PRILOSEC) 20 MG capsule Take 20 mg by mouth daily.    . sertraline (ZOLOFT) 100 MG tablet Take 100 mg by mouth daily.    . traZODone (DESYREL) 100 MG tablet Take 100 mg by mouth at bedtime.    Marland Kitchen ezetimibe (ZETIA) 10 MG tablet Take 1 tablet (10 mg total) by mouth daily. 90 tablet 1   No current facility-administered medications for this visit.     Allergies:   Gabapentin    Social History:  The patient  reports that she has never smoked. She has never used smokeless tobacco. She reports that she does not drink alcohol or use drugs.   Family History:  The patient's family history includes Bone cancer in her brother; Colon cancer in her mother; Heart attack in her brother and father; Stroke in her brother; Thyroid disease in her maternal aunt, maternal grandmother, and maternal uncle.    ROS:  Please see the history of present illness.   Otherwise, review of systems are positive for none.   All other systems are reviewed and negative.    PHYSICAL EXAM: VS:  BP (!) 146/72 Comment: Right arm.  Pulse 74   Ht 5\' 3"  (1.6 m)   Wt 90.7 kg (200 lb)   BMI 35.43 kg/m  , BMI  Body mass index is 35.43 kg/m. GENERAL:  Well appearing HEENT:  Pupils equal round and reactive, fundi not visualized, oral mucosa unremarkable NECK:  No jugular venous distention, waveform within normal limits, carotid upstroke brisk and symmetric, no bruits, no thyromegaly LYMPHATICS:  No cervical adenopathy LUNGS:  Clear to auscultation bilaterally HEART:  RRR.  PMI not displaced or sustained,S1 and S2 within normal limits, no S3, no S4, no clicks, no rubs, no murmurs ABD:  Flat, positive bowel sounds normal in frequency in pitch, no bruits, no rebound, no guarding, no midline pulsatile mass, no hepatomegaly, no splenomegaly EXT:  2 plus pulses throughout, no edema, no cyanosis no clubbing SKIN:  No rashes no nodules NEURO:  Cranial nerves II through XII grossly intact, motor grossly intact throughout PSYCH:  Cognitively intact, oriented to person place and time    EKG:  EKG is ordered today. The ekg ordered today demonstrates Sinus rhythm. Rate 74 bpm. Nonspecific ST/T changes.   Recent Labs: No results found for requested labs within last 8760 hours.   03/26/16: TSH 4.55 Hemoglobin A1c 5.9% Total cholesterol 300, triglycerides 165, HDL 75, LDL 197 WBC 7.4, hemoglobin 12.4, hematocrit 37.4, platelets 351 Sodium 140, potassium 4.1, BUN 15, creatinine 0.1  Lipid Panel No results found for: CHOL, TRIG, HDL, CHOLHDL, VLDL, LDLCALC, LDLDIRECT    Wt Readings from Last 3 Encounters:  04/17/16 90.7 kg (200 lb)  10/14/12 93 kg (205 lb)  07/17/11 89.8 kg (197 lb 15.6 oz)      ASSESSMENT AND PLAN:  # Hypertension:  Blood pressure is above goal.  However, she has not yet taken her antihypertensives. Therefore we will not make any changes at this time. I recommended that she keep a log of her blood pressures at home and bring that to her follow-up appointment with our pharmacist. Continue atenolol and hydrochlorothiazide for now.  Her initial blood pressures showed a 12 mmHg  difference in her systolic blood pressures. However, on repeat there is only a 4 mmHg difference.   # Hyperlipidemia:  Cholesterol levels are very elevated and she has not tolerated many different medications including several statins. She is unwilling to try low-dose statins at this time. We will start Zetia 10 mg daily. She will see our pharmacist in lipid clinic in one month. Although her LDL is greater than 190, her [place] criteria lipid scores only one.    Current medicines are reviewed at length with the patient today.  The patient does not have concerns regarding medicines.  The following changes have been made:  Start Zetia 10 mg daily  Labs/ tests ordered today include:  No orders of the defined types were placed in this encounter.  Disposition:   FU with Cinnamon Morency C. Duke Salvia, MD, [facility] in 3 months.  Pharmacy in 1 month.     This note was written with the assistance of speech recognition software.  Please excuse any transcriptional errors.  Signed, Shaune Westfall C. Duke Salvia, MD, [facility]  04/17/2016 12:48 PM    [facility]

## 2016-04-17 ENCOUNTER — Ambulatory Visit (INDEPENDENT_AMBULATORY_CARE_PROVIDER_SITE_OTHER): Payer: Federal, State, Local not specified - PPO | Admitting: Cardiovascular Disease

## 2016-04-17 ENCOUNTER — Encounter: Payer: Self-pay | Admitting: Cardiovascular Disease

## 2016-04-17 VITALS — BP 146/72 | HR 74 | Ht 63.0 in | Wt 200.0 lb

## 2016-04-17 DIAGNOSIS — E7800 Pure hypercholesterolemia, unspecified: Secondary | ICD-10-CM | POA: Diagnosis not present

## 2016-04-17 DIAGNOSIS — I10 Essential (primary) hypertension: Secondary | ICD-10-CM | POA: Diagnosis not present

## 2016-04-17 DIAGNOSIS — E785 Hyperlipidemia, unspecified: Secondary | ICD-10-CM

## 2016-04-17 HISTORY — DX: Hyperlipidemia, unspecified: E78.5

## 2016-04-17 MED ORDER — EZETIMIBE 10 MG PO TABS: 10.0000 mg | ORAL_TABLET | Freq: Every day | ORAL | 1 refills | Status: DC

## 2016-04-17 MED ORDER — EZETIMIBE 10 MG PO TABS: 10.0000 mg | ORAL_TABLET | Freq: Every day | ORAL | 5 refills | Status: DC

## 2016-04-17 NOTE — Patient Instructions (Signed)
Medication Instructions:  START ZETIA 10 MG DAILY  Labwork: NONE  Testing/Procedures: NONE  Follow-Up: Your physician recommends that you schedule a follow-up appointment in: 1 MONTH WITH PHARM D FOR LIPID CLINIC  Your physician recommends that you schedule a follow-up appointment in: [facility]   If you need a refill on your cardiac medications before your next appointment, please call your pharmacy.

## 2016-04-25 DIAGNOSIS — H04123 Dry eye syndrome of bilateral lacrimal glands: Secondary | ICD-10-CM | POA: Diagnosis not present

## 2016-04-25 DIAGNOSIS — H524 Presbyopia: Secondary | ICD-10-CM | POA: Diagnosis not present

## 2016-05-03 DIAGNOSIS — M542 Cervicalgia: Secondary | ICD-10-CM | POA: Diagnosis not present

## 2016-05-08 DIAGNOSIS — E039 Hypothyroidism, unspecified: Secondary | ICD-10-CM | POA: Diagnosis not present

## 2016-05-15 ENCOUNTER — Ambulatory Visit: Payer: Federal, State, Local not specified - PPO

## 2016-05-17 DIAGNOSIS — E041 Nontoxic single thyroid nodule: Secondary | ICD-10-CM | POA: Diagnosis not present

## 2016-06-01 DIAGNOSIS — T888 Other specified complications of surgical and medical care, not elsewhere classified: Secondary | ICD-10-CM | POA: Diagnosis not present

## 2016-06-01 DIAGNOSIS — E784 Other hyperlipidemia: Secondary | ICD-10-CM | POA: Diagnosis not present

## 2016-06-01 DIAGNOSIS — R0600 Dyspnea, unspecified: Secondary | ICD-10-CM | POA: Diagnosis not present

## 2016-06-01 DIAGNOSIS — K219 Gastro-esophageal reflux disease without esophagitis: Secondary | ICD-10-CM | POA: Diagnosis not present

## 2016-06-01 DIAGNOSIS — Z0189 Encounter for other specified special examinations: Secondary | ICD-10-CM | POA: Diagnosis not present

## 2016-06-01 DIAGNOSIS — I10 Essential (primary) hypertension: Secondary | ICD-10-CM | POA: Diagnosis not present

## 2016-06-01 DIAGNOSIS — E785 Hyperlipidemia, unspecified: Secondary | ICD-10-CM | POA: Diagnosis not present

## 2016-06-01 DIAGNOSIS — R1030 Lower abdominal pain, unspecified: Secondary | ICD-10-CM | POA: Diagnosis not present

## 2016-06-09 DIAGNOSIS — R109 Unspecified abdominal pain: Secondary | ICD-10-CM | POA: Diagnosis not present

## 2016-06-09 DIAGNOSIS — K7689 Other specified diseases of liver: Secondary | ICD-10-CM | POA: Diagnosis not present

## 2016-06-09 DIAGNOSIS — R1084 Generalized abdominal pain: Secondary | ICD-10-CM | POA: Diagnosis not present

## 2016-06-09 DIAGNOSIS — N200 Calculus of kidney: Secondary | ICD-10-CM | POA: Diagnosis not present

## 2016-06-09 DIAGNOSIS — K5790 Diverticulosis of intestine, part unspecified, without perforation or abscess without bleeding: Secondary | ICD-10-CM | POA: Diagnosis not present

## 2016-06-09 DIAGNOSIS — K314 Gastric diverticulum: Secondary | ICD-10-CM | POA: Diagnosis not present

## 2016-06-09 DIAGNOSIS — R110 Nausea: Secondary | ICD-10-CM | POA: Diagnosis not present

## 2016-06-10 DIAGNOSIS — K5790 Diverticulosis of intestine, part unspecified, without perforation or abscess without bleeding: Secondary | ICD-10-CM | POA: Diagnosis not present

## 2016-06-10 DIAGNOSIS — N200 Calculus of kidney: Secondary | ICD-10-CM | POA: Diagnosis not present

## 2016-06-10 DIAGNOSIS — K7689 Other specified diseases of liver: Secondary | ICD-10-CM | POA: Diagnosis not present

## 2016-06-10 DIAGNOSIS — R109 Unspecified abdominal pain: Secondary | ICD-10-CM | POA: Diagnosis not present

## 2016-06-19 DIAGNOSIS — R109 Unspecified abdominal pain: Secondary | ICD-10-CM | POA: Diagnosis not present

## 2016-07-10 DIAGNOSIS — R439 Unspecified disturbances of smell and taste: Secondary | ICD-10-CM | POA: Diagnosis not present

## 2016-07-10 DIAGNOSIS — R1084 Generalized abdominal pain: Secondary | ICD-10-CM | POA: Diagnosis not present

## 2016-07-10 DIAGNOSIS — R700 Elevated erythrocyte sedimentation rate: Secondary | ICD-10-CM | POA: Diagnosis not present

## 2016-07-16 DIAGNOSIS — Z7189 Other specified counseling: Secondary | ICD-10-CM | POA: Diagnosis not present

## 2016-07-17 ENCOUNTER — Ambulatory Visit: Payer: Federal, State, Local not specified - PPO | Admitting: Cardiovascular Disease

## 2016-08-08 ENCOUNTER — Encounter (HOSPITAL_COMMUNITY): Payer: Self-pay | Admitting: Emergency Medicine

## 2016-08-08 ENCOUNTER — Ambulatory Visit (INDEPENDENT_AMBULATORY_CARE_PROVIDER_SITE_OTHER): Payer: Federal, State, Local not specified - PPO

## 2016-08-08 ENCOUNTER — Ambulatory Visit (HOSPITAL_COMMUNITY)
Admission: EM | Admit: 2016-08-08 | Discharge: 2016-08-08 | Disposition: A | Discharge status: Home / Self Care | Payer: Federal, State, Local not specified - PPO | Attending: Internal Medicine | Admitting: Internal Medicine

## 2016-08-08 DIAGNOSIS — J209 Acute bronchitis, unspecified: Secondary | ICD-10-CM | POA: Diagnosis present

## 2016-08-08 DIAGNOSIS — R0789 Other chest pain: Secondary | ICD-10-CM | POA: Diagnosis not present

## 2016-08-08 DIAGNOSIS — R05 Cough: Secondary | ICD-10-CM | POA: Diagnosis not present

## 2016-08-08 MED ORDER — DOXYCYCLINE HYCLATE 100 MG PO CAPS: 100.0000 mg | ORAL_CAPSULE | Freq: Two times a day (BID) | ORAL | 0 refills | Status: DC

## 2016-08-08 MED ORDER — ALBUTEROL SULFATE HFA 108 (90 BASE) MCG/ACT IN AERS: 2.0000 | INHALATION_SPRAY | RESPIRATORY_TRACT | 0 refills | Status: DC | PRN

## 2016-08-08 MED ORDER — BENZONATATE 100 MG PO CAPS: 100.0000 mg | ORAL_CAPSULE | Freq: Three times a day (TID) | ORAL | 0 refills | Status: DC

## 2016-08-08 NOTE — ED Provider Notes (Signed)
CSN: [id number]     Arrival date & time 08/08/16  1730 History   None    Chief Complaint  Patient presents with  . Cough   (Consider location/radiation/quality/duration/timing/severity/associated sxs/prior Treatment) The history is provided by the patient. No language interpreter was used.  Cough  Cough characteristics:  Non-productive Severity:  Moderate Onset quality:  Sudden Timing:  Constant Progression:  Unchanged Chronicity:  New Smoker: no   Context: upper respiratory infection and weather changes   Relieved by:  Rest Worsened by:  Lying down Associated symptoms: chest pain and sore throat   Associated symptoms comment:  Mid sternal Chest soreness and pressure, reports palpitations last night, none today Risk factors: no recent travel     Past Medical History:  Diagnosis Date  . High cholesterol   . Hyperlipidemia 04/17/2016  . Hypertension    about 10 years, well controlled with meds  . Hypothyroidism    about 5 years on oral meds  . Spondylosis of lumbar joint   . Tuberculosis    1983-1985, no recurrance   Past Surgical History:  Procedure Laterality Date  . ABDOMINAL HYSTERECTOMY     2004  . BACK SURGERY     2009 dicsectomy  . BREAST SURGERY     had 5 or 6 cyst removed from breast, starting 1979  . LEFT HEART CATHETERIZATION WITH CORONARY ANGIOGRAM N/A 10/14/2012   Procedure: LEFT HEART CATHETERIZATION WITH CORONARY ANGIOGRAM;  Surgeon: Pamella Pert, MD;  Location: [facility] CATH LAB;  Service: Cardiovascular;  Laterality: N/A;  . LUMBAR DISC SURGERY  07/17/2011   decompression   Family History  Problem Relation Age of Onset  . Colon cancer Mother   . Heart attack Father   . Bone cancer Brother   . Thyroid disease Maternal Grandmother   . Thyroid disease Maternal Aunt   . Thyroid disease Maternal Uncle   . Heart attack Brother   . Stroke Brother   . Hypotension Neg Hx   . Malignant hyperthermia Neg Hx   . Pseudochol deficiency Neg Hx    Social  History  Substance Use Topics  . Smoking status: Never Smoker  . Smokeless tobacco: Never Used  . Alcohol use No   OB History    No data available     Review of Systems  Constitutional: Positive for activity change and appetite change.  HENT: Positive for congestion, sinus pressure and sore throat.   Eyes: Negative.   Respiratory: Positive for cough.   Cardiovascular: Positive for chest pain and palpitations.  Gastrointestinal: Negative.   Endocrine: Negative.   Genitourinary: Negative.   Musculoskeletal: Negative.   Allergic/Immunologic: Negative.   Neurological: Negative.   Hematological: Negative.   Psychiatric/Behavioral: Negative.   All other systems reviewed and are negative.   Allergies  Gabapentin  Home Medications   Prior to Admission medications   Medication Sig Start Date End Date Taking? Authorizing Provider  diltiazem (DILACOR XR) 180 MG 24 hr capsule Take 180 mg by mouth daily.   Yes [provider]  HydroCHLOROthiazide (MICROZIDE PO) Take 25 mg by mouth daily.   Yes [provider]  levothyroxine (SYNTHROID, LEVOTHROID) 75 MCG tablet Take 75 mcg by mouth daily before breakfast.   Yes [provider]  omeprazole (PRILOSEC) 20 MG capsule Take 20 mg by mouth daily.   Yes [provider]  albuterol (PROVENTIL HFA;VENTOLIN HFA) 108 (90 Base) MCG/ACT inhaler Inhale 2 puffs into the lungs every 4 (four) hours as needed for wheezing  or shortness of breath. 08/08/16   Simeon Vera, Para MarchJeanette, NP  benzonatate (TESSALON) 100 MG capsule Take 1 capsule (100 mg total) by mouth every 8 (eight) hours. 08/08/16   Brenn Deziel, Para MarchJeanette, NP  doxycycline (VIBRAMYCIN) 100 MG capsule Take 1 capsule (100 mg total) by mouth 2 (two) times daily. 08/08/16   Dalonte Hardage, Para MarchJeanette, NP   Meds Ordered and Administered this Visit  Medications - No data to display  BP (!) 165/78 (BP Location: Right Arm)   Pulse 78   Temp 98.2 F (36.8 C) (Oral)   Resp 20   SpO2  99%  No data found.   Physical Exam  Constitutional: She is oriented to person, place, and time. She appears well-developed and well-nourished. She is active and cooperative. No distress.  HENT:  Head: Normocephalic.  Right Ear: Tympanic membrane is retracted.  Left Ear: Tympanic membrane is retracted.  Nose: Mucosal edema and sinus tenderness present. Right sinus exhibits maxillary sinus tenderness. Left sinus exhibits maxillary sinus tenderness.  Mouth/Throat: Uvula is midline, oropharynx is clear and moist and mucous membranes are normal. No uvula swelling.  + yellow PND noted  Eyes: Conjunctivae, EOM and lids are normal. Pupils are equal, round, and reactive to light.  Neck: Normal range of motion. No tracheal deviation present.  Cardiovascular: Regular rhythm, normal heart sounds and normal pulses.   No murmur heard. Pulmonary/Chest: Effort normal and breath sounds normal.  Abdominal: Soft. Bowel sounds are normal. There is no tenderness.  Musculoskeletal: Normal range of motion.  Lymphadenopathy:    She has no cervical adenopathy.  Neurological: She is alert and oriented to person, place, and time. GCS eye subscore is 4. GCS verbal subscore is 5. GCS motor subscore is 6.  Skin: Skin is warm and dry. No rash noted.  Psychiatric: She has a normal mood and affect. Her speech is normal and behavior is normal.  Nursing note and vitals reviewed.   Urgent Care Course     Procedures (including critical care time)  Labs Review Labs Reviewed - No data to display  Imaging Review Dg Chest 2 View  Result Date: 08/08/2016 CLINICAL DATA:  Cough. EXAM: CHEST  2 VIEW COMPARISON:  01/26/2015 FINDINGS: The heart size and mediastinal contours are within normal limits. Both lungs are clear. Scoliosis deformity is identified. Degenerative disc disease noted within the thoracic spine. IMPRESSION: No active cardiopulmonary disease. Electronically Signed   By: Signa Kellaylor  Stroud M.D.   On: 08/08/2016  18:52          MDM   1. Acute bronchitis, unspecified organism   2. Chest wall pain     1750: EKG ordered 1802: EKG shows NSR rate 75, no ectopy, no STEMI. CXR ordered for cough.  Your cxr was negative for pneumonia. Rest,push fluids,take abx as directed(doxycycline). Follow up with PCP. Return as needed. Go to ER for new or worsening chest wall pain, palpitations,shortness of breath or additional concerns.    Clancy Gourdefelice, Kachina Niederer, NP 08/08/16 Serena [id number]

## 2016-08-08 NOTE — ED Triage Notes (Signed)
[facility] with a complaint of a cough and chest soreness x 6 day ago that started as a sore throat. The patient reported that the chest tightness has been on going for 3 days. She reported palpitations last night.

## 2016-08-08 NOTE — Discharge Instructions (Addendum)
Your cxr was negative for pneumonia. Rest,push fluids,take abx as directed. Follow up with PCP. Return as needed. Go to ER for new or worsening chest wall pain, palpitations,shortness of breath or additional concerns.

## 2016-08-23 DIAGNOSIS — E7800 Pure hypercholesterolemia, unspecified: Secondary | ICD-10-CM | POA: Diagnosis not present

## 2016-08-23 DIAGNOSIS — J320 Chronic maxillary sinusitis: Secondary | ICD-10-CM | POA: Diagnosis not present

## 2016-08-23 DIAGNOSIS — I10 Essential (primary) hypertension: Secondary | ICD-10-CM | POA: Diagnosis not present

## 2016-08-23 DIAGNOSIS — E039 Hypothyroidism, unspecified: Secondary | ICD-10-CM | POA: Diagnosis not present

## 2016-09-13 DIAGNOSIS — M549 Dorsalgia, unspecified: Secondary | ICD-10-CM | POA: Diagnosis not present

## 2016-09-13 DIAGNOSIS — I10 Essential (primary) hypertension: Secondary | ICD-10-CM | POA: Diagnosis not present

## 2016-09-13 DIAGNOSIS — Z634 Disappearance and death of family member: Secondary | ICD-10-CM | POA: Diagnosis not present

## 2016-10-04 ENCOUNTER — Emergency Department (HOSPITAL_COMMUNITY)
Admission: EM | Admit: 2016-10-04 | Discharge: 2016-10-04 | Disposition: A | Discharge status: Home / Self Care | Payer: Federal, State, Local not specified - PPO | Attending: Emergency Medicine | Admitting: Emergency Medicine

## 2016-10-04 ENCOUNTER — Encounter (HOSPITAL_COMMUNITY): Payer: Self-pay

## 2016-10-04 DIAGNOSIS — Z5321 Procedure and treatment not carried out due to patient leaving prior to being seen by health care provider: Secondary | ICD-10-CM | POA: Insufficient documentation

## 2016-10-04 DIAGNOSIS — R319 Hematuria, unspecified: Secondary | ICD-10-CM | POA: Diagnosis not present

## 2016-10-04 LAB — URINALYSIS, ROUTINE W REFLEX MICROSCOPIC
BACTERIA UA: NONE SEEN
Bilirubin Urine: NEGATIVE
Glucose, UA: NEGATIVE mg/dL
KETONES UR: NEGATIVE mg/dL
Leukocytes, UA: NEGATIVE
Nitrite: NEGATIVE
PROTEIN: NEGATIVE mg/dL
Specific Gravity, Urine: 1.016 (ref 1.005–1.030)
Squamous Epithelial / LPF: NONE SEEN
pH: 7 (ref 5.0–8.0)

## 2016-10-04 LAB — CBC
HCT: 37.9 % (ref 36.0–46.0)
HEMOGLOBIN: 12.5 g/dL (ref 12.0–15.0)
MCH: 29.1 pg (ref 26.0–34.0)
MCHC: 33 g/dL (ref 30.0–36.0)
MCV: 88.1 fL (ref 78.0–100.0)
Platelets: 338 10*3/uL (ref 150–400)
RBC: 4.3 MIL/uL (ref 3.87–5.11)
RDW: 13.6 % (ref 11.5–15.5)
WBC: 7.9 10*3/uL (ref 4.0–10.5)

## 2016-10-04 LAB — COMPREHENSIVE METABOLIC PANEL
ALBUMIN: 3.7 g/dL (ref 3.5–5.0)
ALT: 14 U/L (ref 14–54)
AST: 18 U/L (ref 15–41)
Alkaline Phosphatase: 63 U/L (ref 38–126)
Anion gap: 8 (ref 5–15)
BUN: 16 mg/dL (ref 6–20)
CHLORIDE: 107 mmol/L (ref 101–111)
CO2: 24 mmol/L (ref 22–32)
CREATININE: 1 mg/dL (ref 0.44–1.00)
Calcium: 9.8 mg/dL (ref 8.9–10.3)
GFR calc non Af Amer: >60 mL/min (ref >60)
Glucose, Bld: 110 mg/dL — ABNORMAL HIGH (ref 65–99)
Potassium: 3.5 mmol/L (ref 3.5–5.1)
SODIUM: 139 mmol/L (ref 135–145)
Total Bilirubin: 0.7 mg/dL (ref 0.3–1.2)
Total Protein: 7.2 g/dL (ref 6.5–8.1)

## 2016-10-04 LAB — LIPASE, BLOOD: Lipase: 24 U/L (ref 11–51)

## 2016-10-04 NOTE — ED Notes (Signed)
Patient requesting arm band to be removed so that she can leave and go to urgent care.

## 2016-10-04 NOTE — ED Triage Notes (Signed)
Pt sts that went to restroom and sts she noticed blood, unable to differentiate if vaginally or urethra passing the blood, sts that she is dripping blood and having cramps pt has had hysteretomy, also sts that pinky finger on left hand also is infected. Pt stating that also reports foul odor to her body as well.

## 2016-10-29 DIAGNOSIS — Z6836 Body mass index (BMI) 36.0-36.9, adult: Secondary | ICD-10-CM | POA: Diagnosis not present

## 2016-10-29 DIAGNOSIS — E039 Hypothyroidism, unspecified: Secondary | ICD-10-CM | POA: Diagnosis not present

## 2016-10-29 DIAGNOSIS — E785 Hyperlipidemia, unspecified: Secondary | ICD-10-CM | POA: Diagnosis not present

## 2016-10-29 DIAGNOSIS — I10 Essential (primary) hypertension: Secondary | ICD-10-CM | POA: Diagnosis not present

## 2017-04-26 ENCOUNTER — Emergency Department (HOSPITAL_COMMUNITY): Payer: Federal, State, Local not specified - PPO

## 2017-04-26 ENCOUNTER — Observation Stay (HOSPITAL_COMMUNITY): Payer: Federal, State, Local not specified - PPO

## 2017-04-26 ENCOUNTER — Observation Stay (HOSPITAL_COMMUNITY)
Admission: EM | Admit: 2017-04-26 | Discharge: 2017-04-28 | Disposition: A | Discharge status: Home / Self Care | Payer: Federal, State, Local not specified - PPO | Attending: Internal Medicine | Admitting: Internal Medicine

## 2017-04-26 ENCOUNTER — Encounter (HOSPITAL_COMMUNITY): Payer: Self-pay | Admitting: Emergency Medicine

## 2017-04-26 DIAGNOSIS — E785 Hyperlipidemia, unspecified: Secondary | ICD-10-CM | POA: Diagnosis present

## 2017-04-26 DIAGNOSIS — R202 Paresthesia of skin: Secondary | ICD-10-CM | POA: Diagnosis not present

## 2017-04-26 DIAGNOSIS — R200 Anesthesia of skin: Secondary | ICD-10-CM | POA: Diagnosis not present

## 2017-04-26 DIAGNOSIS — G459 Transient cerebral ischemic attack, unspecified: Secondary | ICD-10-CM | POA: Diagnosis not present

## 2017-04-26 DIAGNOSIS — R42 Dizziness and giddiness: Secondary | ICD-10-CM | POA: Diagnosis not present

## 2017-04-26 DIAGNOSIS — E039 Hypothyroidism, unspecified: Secondary | ICD-10-CM | POA: Insufficient documentation

## 2017-04-26 DIAGNOSIS — E7800 Pure hypercholesterolemia, unspecified: Secondary | ICD-10-CM | POA: Diagnosis not present

## 2017-04-26 DIAGNOSIS — I10 Essential (primary) hypertension: Secondary | ICD-10-CM | POA: Diagnosis not present

## 2017-04-26 DIAGNOSIS — M79602 Pain in left arm: Secondary | ICD-10-CM | POA: Diagnosis not present

## 2017-04-26 DIAGNOSIS — Z8639 Personal history of other endocrine, nutritional and metabolic disease: Secondary | ICD-10-CM | POA: Diagnosis not present

## 2017-04-26 DIAGNOSIS — R030 Elevated blood-pressure reading, without diagnosis of hypertension: Secondary | ICD-10-CM | POA: Diagnosis not present

## 2017-04-26 DIAGNOSIS — Z79899 Other long term (current) drug therapy: Secondary | ICD-10-CM | POA: Insufficient documentation

## 2017-04-26 DIAGNOSIS — R0789 Other chest pain: Secondary | ICD-10-CM | POA: Diagnosis not present

## 2017-04-26 LAB — COMPREHENSIVE METABOLIC PANEL
ALBUMIN: 3.4 g/dL — AB (ref 3.5–5.0)
ALK PHOS: 67 U/L (ref 38–126)
ALT: 20 U/L (ref 14–54)
ANION GAP: 10 (ref 5–15)
AST: 23 U/L (ref 15–41)
BILIRUBIN TOTAL: 0.5 mg/dL (ref 0.3–1.2)
BUN: 17 mg/dL (ref 6–20)
CALCIUM: 9.6 mg/dL (ref 8.9–10.3)
CO2: 24 mmol/L (ref 22–32)
Chloride: 109 mmol/L (ref 101–111)
Creatinine, Ser: 0.87 mg/dL (ref 0.44–1.00)
GFR calc Af Amer: >60 mL/min (ref >60)
GFR calc non Af Amer: >60 mL/min (ref >60)
Glucose, Bld: 112 mg/dL — ABNORMAL HIGH (ref 65–99)
Potassium: 3.3 mmol/L — ABNORMAL LOW (ref 3.5–5.1)
Sodium: 143 mmol/L (ref 135–145)
TOTAL PROTEIN: 6.7 g/dL (ref 6.5–8.1)

## 2017-04-26 LAB — CBC
HEMATOCRIT: 35.6 % — AB (ref 36.0–46.0)
Hemoglobin: 11.7 g/dL — ABNORMAL LOW (ref 12.0–15.0)
MCH: 29.4 pg (ref 26.0–34.0)
MCHC: 32.9 g/dL (ref 30.0–36.0)
MCV: 89.4 fL (ref 78.0–100.0)
Platelets: 376 10*3/uL (ref 150–400)
RBC: 3.98 MIL/uL (ref 3.87–5.11)
RDW: 14 % (ref 11.5–15.5)
WBC: 6.6 10*3/uL (ref 4.0–10.5)

## 2017-04-26 LAB — APTT: APTT: 36 s (ref 24–36)

## 2017-04-26 LAB — LIPID PANEL
CHOL/HDL RATIO: 4 ratio
Cholesterol: 258 mg/dL — ABNORMAL HIGH (ref 0–200)
HDL: 65 mg/dL (ref >40)
LDL CALC: 163 mg/dL — AB (ref 0–99)
Triglycerides: 152 mg/dL — ABNORMAL HIGH (ref <150)
VLDL: 30 mg/dL (ref 0–40)

## 2017-04-26 LAB — ETHANOL

## 2017-04-26 LAB — URINALYSIS, ROUTINE W REFLEX MICROSCOPIC
Bilirubin Urine: NEGATIVE
Glucose, UA: NEGATIVE mg/dL
Hgb urine dipstick: NEGATIVE
Ketones, ur: 5 mg/dL — AB
LEUKOCYTES UA: NEGATIVE
NITRITE: NEGATIVE
PROTEIN: NEGATIVE mg/dL
Specific Gravity, Urine: 1.026 (ref 1.005–1.030)
pH: 5 (ref 5.0–8.0)

## 2017-04-26 LAB — PROTIME-INR
INR: 1.02
Prothrombin Time: 13.3 seconds (ref 11.4–15.2)

## 2017-04-26 LAB — I-STAT CHEM 8, ED
BUN: 20 mg/dL (ref 6–20)
Calcium, Ion: 1.13 mmol/L — ABNORMAL LOW (ref 1.15–1.40)
Chloride: 108 mmol/L (ref 101–111)
Creatinine, Ser: 0.8 mg/dL (ref 0.44–1.00)
Glucose, Bld: 103 mg/dL — ABNORMAL HIGH (ref 65–99)
HCT: 36 % (ref 36.0–46.0)
HEMOGLOBIN: 12.2 g/dL (ref 12.0–15.0)
POTASSIUM: 3.4 mmol/L — AB (ref 3.5–5.1)
Sodium: 142 mmol/L (ref 135–145)
TCO2: 22 mmol/L (ref 22–32)

## 2017-04-26 LAB — DIFFERENTIAL
BASOS ABS: 0 10*3/uL (ref 0.0–0.1)
Basophils Relative: 1 %
EOS ABS: 0.2 10*3/uL (ref 0.0–0.7)
Eosinophils Relative: 2 %
LYMPHS ABS: 2.3 10*3/uL (ref 0.7–4.0)
Lymphocytes Relative: 35 %
MONOS PCT: 5 %
Monocytes Absolute: 0.4 10*3/uL (ref 0.1–1.0)
NEUTROS PCT: 57 %
Neutro Abs: 3.7 10*3/uL (ref 1.7–7.7)

## 2017-04-26 LAB — I-STAT TROPONIN, ED: Troponin i, poc: 0.01 ng/mL (ref 0.00–0.08)

## 2017-04-26 LAB — VITAMIN B12: Vitamin B-12: 642 pg/mL (ref 180–914)

## 2017-04-26 LAB — RAPID URINE DRUG SCREEN, HOSP PERFORMED
Amphetamines: NOT DETECTED
BARBITURATES: NOT DETECTED
Benzodiazepines: NOT DETECTED
Cocaine: NOT DETECTED
OPIATES: NOT DETECTED
TETRAHYDROCANNABINOL: NOT DETECTED

## 2017-04-26 LAB — CBG MONITORING, ED: GLUCOSE-CAPILLARY: 106 mg/dL — AB (ref 65–99)

## 2017-04-26 LAB — I-STAT BETA HCG BLOOD, ED (MC, WL, AP ONLY)

## 2017-04-26 LAB — HEMOGLOBIN A1C
Hgb A1c MFr Bld: 6.1 % — ABNORMAL HIGH (ref 4.8–5.6)
MEAN PLASMA GLUCOSE: 128.37 mg/dL

## 2017-04-26 MED ORDER — STROKE: EARLY STAGES OF RECOVERY BOOK
Freq: Once | Status: AC
  Administered 2017-04-26: 21:00:00
  Filled 2017-04-26: qty 1

## 2017-04-26 MED ORDER — ACETAMINOPHEN 650 MG RE SUPP: 650.0000 mg | RECTAL | Status: DC | PRN

## 2017-04-26 MED ORDER — SODIUM CHLORIDE 0.9 % IV SOLN
INTRAVENOUS | Status: AC
  Administered 2017-04-26: 21:00:00 via INTRAVENOUS

## 2017-04-26 MED ORDER — ACETAMINOPHEN 160 MG/5ML PO SOLN: 650.0000 mg | ORAL | Status: DC | PRN

## 2017-04-26 MED ORDER — ACETAMINOPHEN 325 MG PO TABS
650.0000 mg | ORAL_TABLET | ORAL | Status: DC | PRN
  Administered 2017-04-27 – 2017-04-28 (×2): 650 mg via ORAL
  Filled 2017-04-26 (×2): qty 2

## 2017-04-26 MED ORDER — ASPIRIN EC 81 MG PO TBEC
81.0000 mg | DELAYED_RELEASE_TABLET | Freq: Every day | ORAL | Status: DC
  Administered 2017-04-27 – 2017-04-28 (×2): 81 mg via ORAL
  Filled 2017-04-26 (×2): qty 1

## 2017-04-26 MED ORDER — ASPIRIN 325 MG PO TABS: 325.0000 mg | ORAL_TABLET | Freq: Every day | ORAL | Status: DC

## 2017-04-26 MED ORDER — ALBUTEROL SULFATE (2.5 MG/3ML) 0.083% IN NEBU: 3.0000 mL | INHALATION_SOLUTION | RESPIRATORY_TRACT | Status: DC | PRN

## 2017-04-26 MED ORDER — ASPIRIN 325 MG PO TABS
325.0000 mg | ORAL_TABLET | Freq: Once | ORAL | Status: AC
  Administered 2017-04-26: 325 mg via ORAL
  Filled 2017-04-26: qty 1

## 2017-04-26 MED ORDER — CYCLOSPORINE 0.05 % OP EMUL
1.0000 [drp] | Freq: Two times a day (BID) | OPHTHALMIC | Status: DC
  Administered 2017-04-27: 1 [drp] via OPHTHALMIC
  Filled 2017-04-26 (×4): qty 1

## 2017-04-26 MED ORDER — POTASSIUM CHLORIDE CRYS ER 20 MEQ PO TBCR
40.0000 meq | EXTENDED_RELEASE_TABLET | Freq: Once | ORAL | Status: AC
  Administered 2017-04-26: 40 meq via ORAL
  Filled 2017-04-26: qty 2

## 2017-04-26 MED ORDER — SENNOSIDES-DOCUSATE SODIUM 8.6-50 MG PO TABS: 1.0000 | ORAL_TABLET | Freq: Every evening | ORAL | Status: DC | PRN

## 2017-04-26 MED ORDER — GADOBENATE DIMEGLUMINE 529 MG/ML IV SOLN
20.0000 mL | Freq: Once | INTRAVENOUS | Status: AC
  Administered 2017-04-26: 20 mL via INTRAVENOUS

## 2017-04-26 MED ORDER — FERROUS SULFATE 325 (65 FE) MG PO TABS
325.0000 mg | ORAL_TABLET | ORAL | Status: DC
  Administered 2017-04-26: 325 mg via ORAL
  Filled 2017-04-26: qty 1

## 2017-04-26 MED ORDER — LEVOTHYROXINE SODIUM 75 MCG PO TABS
75.0000 ug | ORAL_TABLET | Freq: Every day | ORAL | Status: DC
  Administered 2017-04-27 – 2017-04-28 (×2): 75 ug via ORAL
  Filled 2017-04-26 (×2): qty 1

## 2017-04-26 MED ORDER — ENOXAPARIN SODIUM 40 MG/0.4ML ~~LOC~~ SOLN
40.0000 mg | SUBCUTANEOUS | Status: DC
  Administered 2017-04-26 – 2017-04-27 (×2): 40 mg via SUBCUTANEOUS
  Filled 2017-04-26 (×2): qty 0.4

## 2017-04-26 MED ORDER — PANTOPRAZOLE SODIUM 40 MG PO TBEC
40.0000 mg | DELAYED_RELEASE_TABLET | Freq: Every day | ORAL | Status: DC
  Administered 2017-04-27: 40 mg via ORAL
  Filled 2017-04-26 (×3): qty 1

## 2017-04-26 NOTE — Consult Note (Signed)
Referring Physician: Delaine Lame, MD    Chief Complaint: Left sided numbness  HPI: Autumn Green is an 57 y.o. female with a past medical history of hypertension and dyslipidemia who presents to the ED with complaints of left-sided paresthesia involving her face, arm and leg which started acutely around 12:30 PM today.  Per patient she was in her usual state of health when around 12:30 PM while cooking, she developed some mild chest pain and a strange feeling on the left side of her body-which she describes as sharp pain radiating from her shoulders to the left side of her face and top of her head, down her left arm and her left leg.  Patient admits to associated symptoms of lightheadedness.  At that time she was holding cooking utensils and she denies extremity weakness, headache, blurred vision, syncope.  Husband denies dysarthria, dysphagia, word finding issues, confusion, loss consciousness or jerking of the extremities during the episode.    She denies history of headache, but over the past week she has had intermittent left and frontal sharp achy headaches which are relieved by Tylenol.  She denies aura, vision changes or neuro deficits associated with it. She came to the ER for evaluation, initial CT of the head was negative.  Patient admits to continuous paresthesias but denies extremity weakness and has been able to ambulate without difficulties.  Neuro was consulted for further evaluation  LSN: 04/26/17 - 1230pm tPA Given: No: No evidence of LVO  Premorbid modified Rankin scale (mRS): 0   Past Medical History:  Diagnosis Date  . High cholesterol   . Hyperlipidemia 04/17/2016  . Hypertension    about 10 years, well controlled with meds  . Hypothyroidism    about 5 years on oral meds  . Spondylosis of lumbar joint   . Tuberculosis    1983-1985, no recurrance    Past Surgical History:  Procedure Laterality Date  . ABDOMINAL HYSTERECTOMY     2004  . BACK SURGERY     2009  dicsectomy  . BREAST SURGERY     had 5 or 6 cyst removed from breast, starting 1979  . LEFT HEART CATHETERIZATION WITH CORONARY ANGIOGRAM N/A 10/14/2012   Procedure: LEFT HEART CATHETERIZATION WITH CORONARY ANGIOGRAM;  Surgeon: Pamella Pert, MD;  Location: [facility] CATH LAB;  Service: Cardiovascular;  Laterality: N/A;  . LUMBAR DISC SURGERY  07/17/2011   decompression    Family History  Problem Relation Age of Onset  . Colon cancer Mother   . Heart attack Father   . Bone cancer Brother   . Thyroid disease Maternal Grandmother   . Thyroid disease Maternal Aunt   . Thyroid disease Maternal Uncle   . Heart attack Brother   . Stroke Brother   . Hypotension Neg Hx   . Malignant hyperthermia Neg Hx   . Pseudochol deficiency Neg Hx    Social History:  reports that  has never smoked. she has never used smokeless tobacco. She reports that she does not drink alcohol or use drugs.  Allergies:  Allergies  Allergen Reactions  . Gabapentin Swelling    Medications:  .  stroke: mapping our early stages of recovery book   Does not apply Once  . aspirin  325 mg Oral Daily  . cycloSPORINE  1 drop Both Eyes BID  . enoxaparin (LOVENOX) injection  40 mg Subcutaneous Q24H  . ferrous sulfate  325 mg Oral Once per day on Mon Wed Fri  . [START  ON 04/27/2017] levothyroxine  75 mcg Oral QAC breakfast  . pantoprazole  40 mg Oral Daily    ROS: 13 point systems reviewed with patient and are negative except for above in HPI  Physical Examination: Blood pressure (!) 144/72, pulse 69, temperature 98.1 F (36.7 C), resp. rate 19, SpO2 99 %. HEENT-  Normocephalic, no lesions, without obvious abnormality.  Normal external eye and conjunctiva.   Cardiovascular- S1-S2 audible, pulses palpable throughout   Lungs-no rhonchi or wheezing noted, no excessive working breathing.  Saturations within normal limits Abdomen- All 4 quadrants palpated and nontender Musculoskeletal-no joint tenderness, deformity or  swelling Skin-warm and dry, no hyperpigmentation, vitiligo, or suspicious lesions  Neurological Examination Mental Status: Alert, oriented, thought content appropriate.  Intact mentation, recall, concentration.  Speech fluent without evidence of aphasia.  Able to follow 3 step commands without difficulty. Cranial Nerves: II: Visual fields grossly normal,  III,IV, VI: ptosis not present, extra-ocular motions intact bilaterally pupils equal, round, reactive to light and accommodation V,VII: smile symmetric, facial light touch sensation normal bilaterally; decreased temp sensation to left face VIII: Hearing intact to voice IX,X: uvula rises symmetrically XI: bilateral shoulder shrug XII: midline tongue extension Motor: Right : Upper extremity   5/5    Left:     Upper extremity   5/5  Lower extremity   5/5     Lower extremity   5/5 Tone and bulk:normal tone throughout; no atrophy noted Sensory: Pinprick and light touch intact on the right. Decreased PP and temp to left leg, arm and fingertips.  No extinction. Deep Tendon Reflexes: 1+ and symmetric throughout Plantars: Right: downgoing  Left: downgoing Cerebellar: Normal finger-to-nose, normal rapid alternating movements and normal heel-to-shin test Gait: Normal gait and station  NIHSS 1a Level of Conscious.: 0 1b LOC Questions: 0 1c LOC Commands: 0 2 Best Gaze: 0 3 Visual: 0 4 Facial Palsy: 0 5a Motor Arm - left: 0 5b Motor Arm - Right: 0 6a Motor Leg - Left: 0 6b Motor Leg - Right: 0 7 Limb Ataxia: 0 8 Sensory:2 9 Best Language: 0 10 Dysarthria: 0 11 Extinct. and Inatten.: 0 TOTAL: 2   Dg Chest 2 View 04/26/2017  IMPRESSION: No active cardiopulmonary disease. Electronically Signed     Ct Head Wo Contrast 04/26/2017 IMPRESSION:  Negative CT of the head.   MRI brain preliminary read by Neurology: No acute or subacute ischemic infarction  Assessment: 57 y.o. female  with a past medical history of hypertension and  dyslipidemia who presents to the ED with complaints of left-sided paresthesia which started acutely around 12:30 PM today.  1.  Rule out stroke/TIA: Patient presents to the ED with left sided paresthesia in her left side of the face, arm and leg, with temp and pinprick sensory loss and no other neuro deficits.  Initial CT of the head was negative.  Due to her symptoms and persistence of her paresthesias, full stroke workup with MR I of the brain and MRA of the head and neck was ordered. MRI showed no acute infarction. MRA head with no LVO. Atherosclerotic narrowing of ICAs seen on MRA neck. Echocardiogram to rule out cardiac valvular abnormality and mural thrombus.  We will start aspirin load at 325 mg today and continue at 81 mg po qd thereafter.  Patient is allergic to statin and has tried 4 different statins with side effects of myalgia. We will start early rehabilitation with physical therapy, occupational therapy and speech therapy.  Patient instructed to inform  staff if symptoms worsen.  2. Accelerated hypertension. Given no acute stroke on MRI, permissive HTN not indicated. Treat BP as per standard protocol. 3. Stroke Risk Factors - family history, hyperlipidemia and hypertension  Plan: - HgbA1c, fasting lipid panel - PT consult, OT consult, Speech consult - Echocardiogram - Prophylactic therapy-Antiplatelet med: Aspirin -325 mg today and continue 81 mg. - No statin at this time due to reported myalgias from trying 4 different statins in the past. - Risk factor modification - Telemetry monitoring - Frequent neuro checks   Vivia Ewingearl Amankwah DNP Neuro-hospitalist Team [phone] 04/26/2017, 4:52 PM  I have seen and examined the patient. Assessment and Plan discussed with team.  Electronically signed: Dr. Caryl PinaEric Berda Shelvin

## 2017-04-26 NOTE — ED Notes (Signed)
MD at bedside - no code stroke being called as symptoms have improved/resolved.

## 2017-04-26 NOTE — ED Triage Notes (Signed)
Per [facility]: Patient to ED from home with stroke symptoms - patient states at 12:30pm she was standing, cooking and felt a "weird sensation come over her," reports dizziness, numbness and tingling down the L side, and just not feeling right. She leaned over the counter and the dizziness did not improve. Patient also endorses headache upon waking this morning - h/a improved after taking BP medication and Tylenol. Patient reports her symptoms have improved, now c/o L arm pain in shoulder area that is worse with movement and equal numbness/tingling to L face, L arm and L leg. Patient denies CP/SOB. 18g. LAC. Initial BP with EMS 210/126 - decreased to 173/82 en route. HR 75 NSR, RR 18, 97% RA, CBG 147. Patient A&O x 4, no focal symptoms noted. Pulses, sensation, and motion equal and intact all extremities.

## 2017-04-26 NOTE — H&P (Signed)
History and Physical    Autumn Green:[id number] DOB: March 09, 1961 DOA: 04/26/2017  PCP: Autumn Peng, MD Patient coming from: home  Chief Complaint: Left Sided numbness  HPI: Autumn Green is a very pleasant 57 y.o. female with medical history significant hypertension, hyperlipidemia presents to the emergency Department chief complaint left-sided numbness. [facility] are asked to admit for TIA workup  Information is obtained from the patient and the chart. She states she was in her usual state of health until about 12:30 this afternoon. States she was standing at the stove cooking she developed sudden onset numbness/tingling on the left side of her body. She states that her whole body at the same time from her forehead to her toes. She denies any headache dizziness visual disturbances syncope or near-syncope. She denies a difficulty chewing swallowing. She called her husband who denies that she had any slurred speech. He denies chest pain palpitations shortness of breath diaphoresis. She does endorse some nausea without vomiting she states she leaned over the sink as she felt a little bit dizzy and called 911. She made her way to a chair. She denied any weakness in her left leg but the reported unsteady gait. She states she had the numbness and tingling still. He also reports intermittent headache over the last 3-4 days. She denies abdominal pain diarrhea constipation melena or bright red per rectum. She denies a dysuria hematuria frequency or urgency. She denies lower extremity edema or orthopnea.  ED Course: In the emergency department she's afebrile hemodynamically stable and not hypoxic.  Review of Systems: As per HPI otherwise all other systems reviewed and are negative.   Ambulatory Status: Ambulates independently is independent with ADLs  Past Medical History:  Diagnosis Date  . High cholesterol   . Hyperlipidemia 04/17/2016  . Hypertension    about 10 years, well controlled  with meds  . Hypothyroidism    about 5 years on oral meds  . Spondylosis of lumbar joint   . Tuberculosis    1983-1985, no recurrance    Past Surgical History:  Procedure Laterality Date  . ABDOMINAL HYSTERECTOMY     2004  . BACK SURGERY     2009 dicsectomy  . BREAST SURGERY     had 5 or 6 cyst removed from breast, starting 1979  . LEFT HEART CATHETERIZATION WITH CORONARY ANGIOGRAM N/A 10/14/2012   Procedure: LEFT HEART CATHETERIZATION WITH CORONARY ANGIOGRAM;  Surgeon: Pamella Pert, MD;  Location: [facility] CATH LAB;  Service: Cardiovascular;  Laterality: N/A;  . LUMBAR DISC SURGERY  07/17/2011   decompression    Social History   Socioeconomic History  . Marital status: Married    Spouse name: Not on file  . Number of children: Not on file  . Years of education: Not on file  . Highest education level: Not on file  Social Needs  . Financial resource strain: Not on file  . Food insecurity - worry: Not on file  . Food insecurity - inability: Not on file  . Transportation needs - medical: Not on file  . Transportation needs - non-medical: Not on file  Occupational History  . Not on file  Tobacco Use  . Smoking status: Never Smoker  . Smokeless tobacco: Never Used  Substance and Sexual Activity  . Alcohol use: No  . Drug use: No  . Sexual activity: Yes    Birth control/protection: None  Other Topics Concern  . Not on file  Social History Narrative  .  Not on file    Allergies  Allergen Reactions  . Gabapentin Swelling    Family History  Problem Relation Age of Onset  . Colon cancer Mother   . Heart attack Father   . Bone cancer Brother   . Thyroid disease Maternal Grandmother   . Thyroid disease Maternal Aunt   . Thyroid disease Maternal Uncle   . Heart attack Brother   . Stroke Brother   . Hypotension Neg Hx   . Malignant hyperthermia Neg Hx   . Pseudochol deficiency Neg Hx     Prior to Admission medications   Medication Sig Start Date End Date Taking?  Authorizing Provider  albuterol (PROVENTIL HFA;VENTOLIN HFA) 108 (90 Base) MCG/ACT inhaler Inhale 2 puffs into the lungs every 4 (four) hours as needed for wheezing or shortness of breath. 08/08/16  Yes Defelice, Para March, NP  cycloSPORINE (RESTASIS) 0.05 % ophthalmic emulsion Place 1 drop into both eyes 2 (two) times daily.   Yes [provider]  diltiazem (DILACOR XR) 180 MG 24 hr capsule Take 180 mg by mouth daily.   Yes [provider]  ferrous sulfate 325 (65 FE) MG tablet Take 325 mg by mouth 3 (three) times a week.   Yes [provider]  levothyroxine (SYNTHROID, LEVOTHROID) 75 MCG tablet Take 75 mcg by mouth daily before breakfast.   Yes [provider]  omeprazole (PRILOSEC) 20 MG capsule Take 20 mg by mouth daily.   Yes [provider]  benzonatate (TESSALON) 100 MG capsule Take 1 capsule (100 mg total) by mouth every 8 (eight) hours. Patient not taking: Reported on 04/26/2017 08/08/16   Defelice, Para March, NP  doxycycline (VIBRAMYCIN) 100 MG capsule Take 1 capsule (100 mg total) by mouth 2 (two) times daily. Patient not taking: Reported on 04/26/2017 08/08/16   Clancy Gourd, NP    Physical Exam: Vitals:   04/26/17 1445 04/26/17 1500 04/26/17 1515 04/26/17 1636  BP: 115/87 (!) 168/74 (!) 144/72   Pulse: 68 66 69   Resp: 16 17 19    Temp:    98.1 F (36.7 C)  TempSrc:      SpO2: 100% 100% 99%      General:  Appears calm and comfortable in no acute distress Eyes:  PERRL, EOMI, normal lids, iris ENT:  grossly normal hearing, lips & tongue, mmm Neck:  no LAD, masses or thyromegaly Cardiovascular:  RRR, no m/r/g. No LE edema.  Respiratory:  CTA bilaterally, no w/r/r. Normal respiratory effort. Abdomen:  soft, ntnd, is a bowel sounds throughout no guarding or rebounding Skin:  no rash or induration seen on limited exam Musculoskeletal:  grossly normal tone BUE/BLE, good ROM, no bony abnormality Psychiatric:  grossly normal mood and  affect, speech fluent and appropriate, AOx3 Neurologic:  Alert and oriented 3 speech clear facial symmetry bilateral grip 5 out of 5 lower extremity strength 5 out of 5 bilaterally decreased sensation on left. Tongue midline  Labs on Admission: I have personally reviewed following labs and imaging studies  CBC: Recent Labs  Lab 04/26/17 1504 04/26/17 1523  WBC 6.6  --   NEUTROABS 3.7  --   HGB 11.7* 12.2  HCT 35.6* 36.0  MCV 89.4  --   PLT 376  --    Basic Metabolic Panel: Recent Labs  Lab 04/26/17 1504 04/26/17 1523  NA 143 142  K 3.3* 3.4*  CL 109 108  CO2 24  --   GLUCOSE 112* 103*  BUN 17 20  CREATININE  0.87 0.80  CALCIUM 9.6  --    GFR: CrCl cannot be calculated (Unknown ideal weight.). Liver Function Tests: Recent Labs  Lab 04/26/17 1504  AST 23  ALT 20  ALKPHOS 67  BILITOT 0.5  PROT 6.7  ALBUMIN 3.4*   No results for input(s): LIPASE, AMYLASE in the last 168 hours. No results for input(s): AMMONIA in the last 168 hours. Coagulation Profile: Recent Labs  Lab 04/26/17 1504  INR 1.02   Cardiac Enzymes: No results for input(s): CKTOTAL, CKMB, CKMBINDEX, TROPONINI in the last 168 hours. BNP (last 3 results) No results for input(s): PROBNP in the last 8760 hours. HbA1C: No results for input(s): HGBA1C in the last 72 hours. CBG: Recent Labs  Lab 04/26/17 1511  GLUCAP 106*   Lipid Profile: No results for input(s): CHOL, HDL, LDLCALC, TRIG, CHOLHDL, LDLDIRECT in the last 72 hours. Thyroid Function Tests: No results for input(s): TSH, T4TOTAL, FREET4, T3FREE, THYROIDAB in the last 72 hours. Anemia Panel: No results for input(s): VITAMINB12, FOLATE, FERRITIN, TIBC, IRON, RETICCTPCT in the last 72 hours. Urine analysis:    Component Value Date/Time   COLORURINE YELLOW 10/04/2016 0315   APPEARANCEUR CLEAR 10/04/2016 0315   LABSPEC 1.016 10/04/2016 0315   PHURINE 7.0 10/04/2016 0315   GLUCOSEU NEGATIVE 10/04/2016 0315   HGBUR SMALL (A)  10/04/2016 0315   BILIRUBINUR NEGATIVE 10/04/2016 0315   KETONESUR NEGATIVE 10/04/2016 0315   PROTEINUR NEGATIVE 10/04/2016 0315   UROBILINOGEN 0.2 03/11/2014 1700   NITRITE NEGATIVE 10/04/2016 0315   LEUKOCYTESUR NEGATIVE 10/04/2016 0315    Creatinine Clearance: CrCl cannot be calculated (Unknown ideal weight.).  Sepsis Labs: @LABRCNTIP (procalcitonin:4,lacticidven:4) )No results found for this or any previous visit (from the past 240 hour(s)).   Radiological Exams on Admission: Dg Chest 2 View  Result Date: 04/26/2017 CLINICAL DATA:  Dizziness and chest tenderness radiating to the left side EXAM: CHEST  2 VIEW COMPARISON:  08/08/2016 FINDINGS: The heart size and mediastinal contours are within normal limits. Both lungs are clear. The visualized skeletal structures are unremarkable. IMPRESSION: No active cardiopulmonary disease. Electronically Signed   By: Elige Ko   On: 04/26/2017 16:18   Ct Head Wo Contrast  Result Date: 04/26/2017 CLINICAL DATA:  Dizziness. New onset left-sided numbness beginning 3 hours ago. EXAM: CT HEAD WITHOUT CONTRAST TECHNIQUE: Contiguous axial images were obtained from the base of the skull through the vertex without intravenous contrast. COMPARISON:  CT head without contrast 07/09/2006 FINDINGS: Brain: No acute infarct, hemorrhage, or mass lesion is present. The ventricles are of normal size. No significant extraaxial fluid collection is present. No significant white matter disease is present. Brainstem and cerebellum are normal. Vascular: No hyperdense vessel or unexpected calcification. Skull: Calvarium is intact. No focal lytic or blastic lesions are present. The visualized extracranial soft tissues are within normal limits. Sinuses/Orbits: A remote right orbital blowout fracture is present. The paranasal sinuses are clear. Mastoid air cells are clear. Globes and orbits are otherwise within normal limits. IMPRESSION: Negative CT of the head. Electronically  Signed   By: Marin Roberts M.D.   On: 04/26/2017 15:24    EKG: Sinus rhythm Nonspecific T abnrm, anterolateral leads Nonspecific changes  Assessment/Plan Principal Problem:   Left sided numbness Active Problems:   Hypertension   Hyperlipidemia   Hypothyroidism   #1. Left-sided numbness/TIA. Patient continues with mild numbness on the left side at time of admission. Risk factors include hypertension hypercholesterolemia. CT of the head is negative. Blood pressure is high end  of normal. She's passed her bedside swallow eval in the emergency department. EDP contacts neurology recommend medical admission for TIA workup -Admit to telemetry -MRI MRA of the brain -Carotid Dopplers -Hg A1c and lipid panel -Aspirin and statin -Heart healthy diet -PT OT  #2. Hypertension. Blood pressure high end of normal in the emergency department. I medications include diltiazem -Hold diltiazem for now allowing For permissive hypertension -Monitor  #3. Hyperlipidemia -Statin -Lipid panel  #4. Hypothyroidism. -Continue home Synthroid   DVT prophylaxis: lovenox Code Status: full  Family Communication: husband at bedside  Disposition Plan: home  Consults called: lindzen  Admission status: obs    Toya SmothersBLACK,KAREN M MD [facility]  If 7PM-7AM, please contact night-coverage [email or web address] Password [email or web address]  04/26/2017, 4:47 PM

## 2017-04-26 NOTE — ED Provider Notes (Signed)
MSE was initiated and I personally evaluated the patient and placed orders (if any) at  2:48 PM on April 26, 2017.  The patient appears stable so that the remainder of the MSE may be completed by another provider.  Patient sudden onset of numbness while cooking at 1230.  She felt that it was the left half of her body including the face.  Motor deficit.  She did not drop anything.  No syncope.  No palpitations.  He did perceive some chest pain on the left side at that time.  That is resolved.  She has no chest pain at this time.  Patient is alert and appropriate.  Ental status clear.  Cranial nerves II through XII normal.  Motor strength 5\5 upper and lower.  Patient does not endorse subjective sensory difference to light touch.  Heart regular.  Lungs clear.  Skin warm and dry.  Diagnostic workup initiated for neurologic presentation possible TIA/CVA.  At this time patient does not have acute deficits to suggest need for code stroke.   Arby BarrettePfeiffer, Yomar Mejorado, MD 04/26/17 1450

## 2017-04-26 NOTE — ED Notes (Signed)
Patient transported to CT 

## 2017-04-26 NOTE — ED Notes (Signed)
Admitting at bedside 

## 2017-04-26 NOTE — ED Provider Notes (Signed)
[facility] PROGRESSIVE CARE Provider Note   CSN: [id number] Arrival date & time: 04/26/17  1421     History   Chief Complaint Chief Complaint  Patient presents with  . Extremity Weakness  . Hypertension    HPI Autumn Green is a 57 y.o. female.  HPI   57 year old female with a history of hypertension, hyperlipidemia presents with concern for left-sided numbness.  Patient reports she is in a normal state of health until around 12:30 PM while she was cooking and she developed a strange sensation on the left side of her body.  Reports it felt that the left side of her body was numb all the way from her toes up to her forehead.  Reports she did have an aching type of pain also in the left side, and does note some left arm pain with movement.  Reports initially she did have some aching pain that was present in her chest, although it was present in other areas of the left side, and that has now resolved.  Denies any weakness.  Reports she did have some lightheadedness at the time.  Reports she continues to have some numbness in the left side, and nausea.  Reports she has had headaches over the last 3 days that began slowly and have worsened.  She denies any history of headaches.  Denies difficulty speaking, difficulty walking, change in vision. No history of strokes, cardiac problems, smoking, alcohol or drug use.  She reports she is otherwise been in a normal state of health.  Past Medical History:  Diagnosis Date  . High cholesterol   . Hyperlipidemia 04/17/2016  . Hypertension    about 10 years, well controlled with meds  . Hypothyroidism    about 5 years on oral meds  . Spondylosis of lumbar joint   . Tuberculosis    1983-1985, no recurrance    Patient Active Problem List   Diagnosis Date Noted  . Left sided numbness 04/26/2017  . Hypothyroidism   . Acute bronchitis 08/08/2016  . Chest wall pain 08/08/2016  . Hyperlipidemia 04/17/2016  . Hypertension 08/18/2012    Past  Surgical History:  Procedure Laterality Date  . ABDOMINAL HYSTERECTOMY     2004  . BACK SURGERY     2009 dicsectomy  . BREAST SURGERY     had 5 or 6 cyst removed from breast, starting 1979  . LEFT HEART CATHETERIZATION WITH CORONARY ANGIOGRAM N/A 10/14/2012   Procedure: LEFT HEART CATHETERIZATION WITH CORONARY ANGIOGRAM;  Surgeon: Pamella Pert, MD;  Location: [facility] CATH LAB;  Service: Cardiovascular;  Laterality: N/A;  . LUMBAR DISC SURGERY  07/17/2011   decompression    OB History    No data available       Home Medications    Prior to Admission medications   Medication Sig Start Date End Date Taking? Authorizing Provider  albuterol (PROVENTIL HFA;VENTOLIN HFA) 108 (90 Base) MCG/ACT inhaler Inhale 2 puffs into the lungs every 4 (four) hours as needed for wheezing or shortness of breath. 08/08/16  Yes Defelice, Para March, NP  cycloSPORINE (RESTASIS) 0.05 % ophthalmic emulsion Place 1 drop into both eyes 2 (two) times daily.   Yes [provider]  diltiazem (DILACOR XR) 180 MG 24 hr capsule Take 180 mg by mouth daily.   Yes [provider]  ferrous sulfate 325 (65 FE) MG tablet Take 325 mg by mouth 3 (three) times a week.   Yes [provider]  levothyroxine (SYNTHROID, LEVOTHROID)  75 MCG tablet Take 75 mcg by mouth daily before breakfast.   Yes [provider]  omeprazole (PRILOSEC) 20 MG capsule Take 20 mg by mouth daily.   Yes [provider]  benzonatate (TESSALON) 100 MG capsule Take 1 capsule (100 mg total) by mouth every 8 (eight) hours. Patient not taking: Reported on 04/26/2017 08/08/16   Defelice, Para March, NP  doxycycline (VIBRAMYCIN) 100 MG capsule Take 1 capsule (100 mg total) by mouth 2 (two) times daily. Patient not taking: Reported on 04/26/2017 08/08/16   DefelicePara March, NP    Family History Family History  Problem Relation Age of Onset  . Colon cancer Mother   . Heart attack Father   . Bone cancer Brother   . Thyroid  disease Maternal Grandmother   . Thyroid disease Maternal Aunt   . Thyroid disease Maternal Uncle   . Heart attack Brother   . Stroke Brother   . Hypotension Neg Hx   . Malignant hyperthermia Neg Hx   . Pseudochol deficiency Neg Hx     Social History Social History   Tobacco Use  . Smoking status: Never Smoker  . Smokeless tobacco: Never Used  Substance Use Topics  . Alcohol use: No  . Drug use: No     Allergies   Gabapentin   Review of Systems Review of Systems  Constitutional: Negative for fever.  HENT: Negative for sore throat.   Eyes: Negative for visual disturbance.  Respiratory: Negative for cough and shortness of breath.   Cardiovascular: Negative for chest pain.  Gastrointestinal: Positive for nausea. Negative for abdominal pain, diarrhea and vomiting.  Genitourinary: Negative for difficulty urinating.  Musculoskeletal: Negative for back pain and neck pain.  Skin: Negative for rash.  Neurological: Positive for light-headedness, numbness and headaches. Negative for dizziness, syncope, facial asymmetry, speech difficulty and weakness.     Physical Exam Updated Vital Signs BP (!) 147/62 (BP Location: Left Arm)   Pulse 60   Temp 98.5 F (36.9 C) (Oral)   Resp 18   SpO2 100%   Physical Exam  Constitutional: She is oriented to person, place, and time. She appears well-developed and well-nourished. No distress.  HENT:  Head: Normocephalic and atraumatic.  Eyes: Conjunctivae and EOM are normal.  Neck: Normal range of motion.  Cardiovascular: Normal rate, regular rhythm, normal heart sounds and intact distal pulses. Exam reveals no gallop and no friction rub.  No murmur heard. Pulmonary/Chest: Effort normal and breath sounds normal. No respiratory distress. She has no wheezes. She has no rales.  Musculoskeletal: She exhibits no edema or tenderness.  Neurological: She is alert and oriented to person, place, and time. She has normal strength. A sensory deficit  (reports left leg and face numbness, denies current numbness in left arm) is present. No cranial nerve deficit (reports left facial numbness to light touch, other cranial nerves tested without abnormality, normal visual fields). Coordination normal. GCS eye subscore is 4. GCS verbal subscore is 5. GCS motor subscore is 6.  Skin: Skin is warm and dry. No rash noted. She is not diaphoretic. No erythema.  Nursing note and vitals reviewed.    ED Treatments / Results  Labs (all labs ordered are listed, but only abnormal results are displayed) Labs Reviewed  CBC - Abnormal; Notable for the following components:      Result Value   Hemoglobin 11.7 (*)    HCT 35.6 (*)    All other components within normal limits  COMPREHENSIVE METABOLIC PANEL -  Abnormal; Notable for the following components:   Potassium 3.3 (*)    Glucose, Bld 112 (*)    Albumin 3.4 (*)    All other components within normal limits  URINALYSIS, ROUTINE W REFLEX MICROSCOPIC - Abnormal; Notable for the following components:   APPearance HAZY (*)    Ketones, ur 5 (*)    All other components within normal limits  HEMOGLOBIN A1C - Abnormal; Notable for the following components:   Hgb A1c MFr Bld 6.1 (*)    All other components within normal limits  LIPID PANEL - Abnormal; Notable for the following components:   Cholesterol 258 (*)    Triglycerides 152 (*)    LDL Cholesterol 163 (*)    All other components within normal limits  I-STAT CHEM 8, ED - Abnormal; Notable for the following components:   Potassium 3.4 (*)    Glucose, Bld 103 (*)    Calcium, Ion 1.13 (*)    All other components within normal limits  CBG MONITORING, ED - Abnormal; Notable for the following components:   Glucose-Capillary 106 (*)    All other components within normal limits  ETHANOL  PROTIME-INR  APTT  DIFFERENTIAL  RAPID URINE DRUG SCREEN, HOSP PERFORMED  VITAMIN B12  HIV ANTIBODY (ROUTINE TESTING)  I-STAT TROPONIN, ED  I-STAT BETA HCG BLOOD,  ED ([facility], [facility], [facility] ONLY)  I-STAT TROPONIN, ED    EKG  EKG Interpretation  Date/Time:  Friday April 26 2017 14:28:52 EST Ventricular Rate:  69 PR Interval:    QRS Duration: 98 QT Interval:  364 QTC Calculation: 390 R Axis:   55 Text Interpretation:  Sinus rhythm Nonspecific T abnrm, anterolateral leads Nonspecific changes more exaggerated today in comparison to prior Confirmed by Alvira Monday ([id number]) on 04/26/2017 2:55:06 PM       Radiology Dg Chest 2 View  Result Date: 04/26/2017 CLINICAL DATA:  Dizziness and chest tenderness radiating to the left side EXAM: CHEST  2 VIEW COMPARISON:  08/08/2016 FINDINGS: The heart size and mediastinal contours are within normal limits. Both lungs are clear. The visualized skeletal structures are unremarkable. IMPRESSION: No active cardiopulmonary disease. Electronically Signed   By: Elige Ko   On: 04/26/2017 16:18   Ct Head Wo Contrast  Result Date: 04/26/2017 CLINICAL DATA:  Dizziness. New onset left-sided numbness beginning 3 hours ago. EXAM: CT HEAD WITHOUT CONTRAST TECHNIQUE: Contiguous axial images were obtained from the base of the skull through the vertex without intravenous contrast. COMPARISON:  CT head without contrast 07/09/2006 FINDINGS: Brain: No acute infarct, hemorrhage, or mass lesion is present. The ventricles are of normal size. No significant extraaxial fluid collection is present. No significant white matter disease is present. Brainstem and cerebellum are normal. Vascular: No hyperdense vessel or unexpected calcification. Skull: Calvarium is intact. No focal lytic or blastic lesions are present. The visualized extracranial soft tissues are within normal limits. Sinuses/Orbits: A remote right orbital blowout fracture is present. The paranasal sinuses are clear. Mastoid air cells are clear. Globes and orbits are otherwise within normal limits. IMPRESSION: Negative CT of the head. Electronically Signed   By: Marin Roberts  M.D.   On: 04/26/2017 15:24   Mr Maxine Glenn Neck W Wo Contrast  Result Date: 04/26/2017 CLINICAL DATA:  Transient ischemic attack EXAM: MR HEAD WITHOUT CONTRAST MR CIRCLE OF WILLIS WITHOUT CONTRAST MRA OF THE NECK WITHOUT AND WITH CONTRAST TECHNIQUE: Multiplanar, multiecho pulse sequences of the brain, circle of willis and surrounding structures were obtained without intravenous contrast.  Angiographic images of the neck were obtained using MRA technique without and with intravenous contrast. CONTRAST:  20mL MULTIHANCE GADOBENATE DIMEGLUMINE 529 MG/ML IV SOLN COMPARISON:  Head CT 04/26/2017 FINDINGS: MRI HEAD FINDINGS Brain: The midline structures are normal. No focal diffusion restriction to indicate acute infarct. No intraparenchymal hemorrhage. Multifocal juxtacortical and deep white matter hyperintensity, most often a result of chronic microvascular ischemia. Focus of subcortical hyperintensity in the right frontal lobe may be an old infarct. No mass lesion. No chronic microhemorrhage or cerebral amyloid angiopathy. No hydrocephalus, age advanced atrophy or lobar predominant volume loss. No dural abnormality or extra-axial collection. Skull and upper cervical spine: The visualized skull base, calvarium, upper cervical spine and extracranial soft tissues are normal. Sinuses/Orbits: No fluid levels or advanced mucosal thickening. No mastoid effusion. Normal orbits. MRA HEAD FINDINGS Intracranial internal carotid arteries: Normal. Anterior cerebral arteries: Normal. Middle cerebral arteries: Normal. Posterior communicating arteries: Absent bilaterally. Posterior cerebral arteries: Normal. Basilar artery: Normal. Vertebral arteries: Right dominant.  Normal. Superior cerebellar arteries: Normal. Anterior inferior cerebellar arteries: Normal. Posterior inferior cerebellar arteries: Normal. MRA NECK FINDINGS Aortic arch: Normal 3 vessel aortic branching pattern. The visualized subclavian arteries are normal. Right carotid  system: Normal course and caliber without stenosis or evidence of dissection. Left carotid system: Normal course and caliber without stenosis or evidence of dissection. Vertebral arteries: Right dominant. Vertebral artery origins are normal. Vertebral arteries are normal in course and caliber to the vertebrobasilar confluence without stenosis or evidence of dissection. IMPRESSION: 1. Chronic ischemic white matter findings without acute intracranial abnormality. 2. Normal MRAs of the head and neck. Electronically Signed   By: Deatra Robinson M.D.   On: 04/26/2017 20:28   Mr Brain Wo Contrast  Result Date: 04/26/2017 CLINICAL DATA:  Transient ischemic attack EXAM: MR HEAD WITHOUT CONTRAST MR CIRCLE OF WILLIS WITHOUT CONTRAST MRA OF THE NECK WITHOUT AND WITH CONTRAST TECHNIQUE: Multiplanar, multiecho pulse sequences of the brain, circle of willis and surrounding structures were obtained without intravenous contrast. Angiographic images of the neck were obtained using MRA technique without and with intravenous contrast. CONTRAST:  20mL MULTIHANCE GADOBENATE DIMEGLUMINE 529 MG/ML IV SOLN COMPARISON:  Head CT 04/26/2017 FINDINGS: MRI HEAD FINDINGS Brain: The midline structures are normal. No focal diffusion restriction to indicate acute infarct. No intraparenchymal hemorrhage. Multifocal juxtacortical and deep white matter hyperintensity, most often a result of chronic microvascular ischemia. Focus of subcortical hyperintensity in the right frontal lobe may be an old infarct. No mass lesion. No chronic microhemorrhage or cerebral amyloid angiopathy. No hydrocephalus, age advanced atrophy or lobar predominant volume loss. No dural abnormality or extra-axial collection. Skull and upper cervical spine: The visualized skull base, calvarium, upper cervical spine and extracranial soft tissues are normal. Sinuses/Orbits: No fluid levels or advanced mucosal thickening. No mastoid effusion. Normal orbits. MRA HEAD FINDINGS  Intracranial internal carotid arteries: Normal. Anterior cerebral arteries: Normal. Middle cerebral arteries: Normal. Posterior communicating arteries: Absent bilaterally. Posterior cerebral arteries: Normal. Basilar artery: Normal. Vertebral arteries: Right dominant.  Normal. Superior cerebellar arteries: Normal. Anterior inferior cerebellar arteries: Normal. Posterior inferior cerebellar arteries: Normal. MRA NECK FINDINGS Aortic arch: Normal 3 vessel aortic branching pattern. The visualized subclavian arteries are normal. Right carotid system: Normal course and caliber without stenosis or evidence of dissection. Left carotid system: Normal course and caliber without stenosis or evidence of dissection. Vertebral arteries: Right dominant. Vertebral artery origins are normal. Vertebral arteries are normal in course and caliber to the vertebrobasilar confluence without stenosis or evidence of  dissection. IMPRESSION: 1. Chronic ischemic white matter findings without acute intracranial abnormality. 2. Normal MRAs of the head and neck. Electronically Signed   By: Deatra RobinsonKevin  Herman M.D.   On: 04/26/2017 20:28   Mr Maxine GlennMra Head Wo Contrast  Result Date: 04/26/2017 CLINICAL DATA:  Transient ischemic attack EXAM: MR HEAD WITHOUT CONTRAST MR CIRCLE OF WILLIS WITHOUT CONTRAST MRA OF THE NECK WITHOUT AND WITH CONTRAST TECHNIQUE: Multiplanar, multiecho pulse sequences of the brain, circle of willis and surrounding structures were obtained without intravenous contrast. Angiographic images of the neck were obtained using MRA technique without and with intravenous contrast. CONTRAST:  20mL MULTIHANCE GADOBENATE DIMEGLUMINE 529 MG/ML IV SOLN COMPARISON:  Head CT 04/26/2017 FINDINGS: MRI HEAD FINDINGS Brain: The midline structures are normal. No focal diffusion restriction to indicate acute infarct. No intraparenchymal hemorrhage. Multifocal juxtacortical and deep white matter hyperintensity, most often a result of chronic microvascular  ischemia. Focus of subcortical hyperintensity in the right frontal lobe may be an old infarct. No mass lesion. No chronic microhemorrhage or cerebral amyloid angiopathy. No hydrocephalus, age advanced atrophy or lobar predominant volume loss. No dural abnormality or extra-axial collection. Skull and upper cervical spine: The visualized skull base, calvarium, upper cervical spine and extracranial soft tissues are normal. Sinuses/Orbits: No fluid levels or advanced mucosal thickening. No mastoid effusion. Normal orbits. MRA HEAD FINDINGS Intracranial internal carotid arteries: Normal. Anterior cerebral arteries: Normal. Middle cerebral arteries: Normal. Posterior communicating arteries: Absent bilaterally. Posterior cerebral arteries: Normal. Basilar artery: Normal. Vertebral arteries: Right dominant.  Normal. Superior cerebellar arteries: Normal. Anterior inferior cerebellar arteries: Normal. Posterior inferior cerebellar arteries: Normal. MRA NECK FINDINGS Aortic arch: Normal 3 vessel aortic branching pattern. The visualized subclavian arteries are normal. Right carotid system: Normal course and caliber without stenosis or evidence of dissection. Left carotid system: Normal course and caliber without stenosis or evidence of dissection. Vertebral arteries: Right dominant. Vertebral artery origins are normal. Vertebral arteries are normal in course and caliber to the vertebrobasilar confluence without stenosis or evidence of dissection. IMPRESSION: 1. Chronic ischemic white matter findings without acute intracranial abnormality. 2. Normal MRAs of the head and neck. Electronically Signed   By: Deatra RobinsonKevin  Herman M.D.   On: 04/26/2017 20:28    Procedures Procedures (including critical care time)  Medications Ordered in ED Medications  albuterol (PROVENTIL) (2.5 MG/3ML) 0.083% nebulizer solution 3 mL (not administered)  cycloSPORINE (RESTASIS) 0.05 % ophthalmic emulsion 1 drop (1 drop Both Eyes Not Given 04/26/17  2103)  ferrous sulfate tablet 325 mg (325 mg Oral Given 04/26/17 2102)  levothyroxine (SYNTHROID, LEVOTHROID) tablet 75 mcg (not administered)  pantoprazole (PROTONIX) EC tablet 40 mg (40 mg Oral Not Given 04/26/17 2105)  0.9 %  sodium chloride infusion ( Intravenous New Bag/Given 04/26/17 2125)  acetaminophen (TYLENOL) tablet 650 mg (not administered)    Or  acetaminophen (TYLENOL) solution 650 mg (not administered)    Or  acetaminophen (TYLENOL) suppository 650 mg (not administered)  senna-docusate (Senokot-S) tablet 1 tablet (not administered)  enoxaparin (LOVENOX) injection 40 mg (40 mg Subcutaneous Given 04/26/17 2102)  aspirin EC tablet 81 mg (not administered)   stroke: mapping our early stages of recovery book ( Does not apply Given 04/26/17 2104)  potassium chloride SA (K-DUR,KLOR-CON) CR tablet 40 mEq (40 mEq Oral Given 04/26/17 2102)  aspirin tablet 325 mg (325 mg Oral Given 04/26/17 2102)  gadobenate dimeglumine (MULTIHANCE) injection 20 mL (20 mLs Intravenous Contrast Given 04/26/17 2011)     Initial Impression / Assessment and Plan /  ED Course  I have reviewed the triage vital signs and the nursing notes.  Pertinent labs & imaging results that were available during my care of the patient were reviewed by me and considered in my medical decision making (see chart for details).     57 year old female with a history of hypertension, hyperlipidemia presents with concern for left-sided numbness waxing and waning since 12:30PM today.  Patient had MSE done prior to my arrival, did not call code stroke given patient without deficits.  On my exam reports some numbness in face and leg, however no other abnormalities and do not feel she would be an appropriate tPA candidate given mild symptoms.  Will call Neurology to discuss case further testing. History concerning for possible CVA.   CT head done and WNL. Glucose WNL. Electrolytes WNL.  Discussed with Dr. Otelia Limes of Neurology. Neurology  to evaluate her. Agree given waxing, waning symptoms and risk factors to admit with concern for possible CVA/TIA.  Admitted for further care.  Final Clinical Impressions(s) / ED Diagnoses   Final diagnoses:  Left sided numbness    ED Discharge Orders    None       Alvira Monday, MD 04/27/17 0145

## 2017-04-27 ENCOUNTER — Observation Stay (HOSPITAL_COMMUNITY): Payer: Federal, State, Local not specified - PPO

## 2017-04-27 ENCOUNTER — Observation Stay (HOSPITAL_BASED_OUTPATIENT_CLINIC_OR_DEPARTMENT_OTHER): Payer: Federal, State, Local not specified - PPO

## 2017-04-27 DIAGNOSIS — R202 Paresthesia of skin: Secondary | ICD-10-CM | POA: Diagnosis not present

## 2017-04-27 DIAGNOSIS — R200 Anesthesia of skin: Secondary | ICD-10-CM

## 2017-04-27 DIAGNOSIS — I10 Essential (primary) hypertension: Secondary | ICD-10-CM | POA: Diagnosis not present

## 2017-04-27 DIAGNOSIS — G459 Transient cerebral ischemic attack, unspecified: Secondary | ICD-10-CM | POA: Diagnosis not present

## 2017-04-27 DIAGNOSIS — E785 Hyperlipidemia, unspecified: Secondary | ICD-10-CM | POA: Diagnosis not present

## 2017-04-27 LAB — ECHOCARDIOGRAM COMPLETE

## 2017-04-27 LAB — HIV ANTIBODY (ROUTINE TESTING W REFLEX): HIV Screen 4th Generation wRfx: NONREACTIVE

## 2017-04-27 MED ORDER — POTASSIUM CHLORIDE CRYS ER 20 MEQ PO TBCR: 40.0000 meq | EXTENDED_RELEASE_TABLET | Freq: Once | ORAL | Status: DC

## 2017-04-27 MED ORDER — ATORVASTATIN CALCIUM 80 MG PO TABS
80.0000 mg | ORAL_TABLET | Freq: Every day | ORAL | Status: DC
  Administered 2017-04-27: 80 mg via ORAL
  Filled 2017-04-27: qty 1

## 2017-04-27 NOTE — Evaluation (Signed)
Occupational Therapy Evaluation Patient Details Name: Autumn BalesBetty J Green MRN: [id number] DOB: 01-Mar-1961 Today's Date: 04/27/2017    History of Present Illness 57 y.o. female admitted on 04/26/17 for left sided tingling.  CT and MRI both clear.  Suspected TIA.  Pt with significant PMH of TB, HTN, and lumbar disc surgery.   Clinical Impression   PTA, pt was independent with ADL and functional mobility. 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 Pt currently requires overall supervision for ADL participation and presents with decreased L UE sensation specifically for light touch. Initiated education concerning strategies to maximize safety and compensate for decreased sensation during work and home management tasks. She would benefit from continued OT services while admitted but do not anticipate need for further OT follow-up post-acute D/C.     Follow Up Recommendations  No OT follow up    Equipment Recommendations  None recommended by OT    Recommendations for Other Services       Precautions / Restrictions Precautions Precautions: None      Mobility Bed Mobility Overal bed mobility: Independent                Transfers Overall transfer level: Needs assistance Equipment used: None Transfers: Sit to/from Stand Sit to Stand: Supervision         General transfer comment: supervision for safety    Balance Overall balance assessment: Needs assistance Sitting-balance support: No upper extremity supported Sitting balance-Leahy Scale: Good     Standing balance support: No upper extremity supported Standing balance-Leahy Scale: Good                             ADL either performed or assessed with clinical judgement   ADL Overall ADL's : Needs assistance/impaired                                       General ADL Comments: Overall supervision for safety with basic ADL tasks. On my arrival, pt was  standing at the sink completing bathing and dressing tasks with supervision from her husband.      Vision Patient Visual Report: No change from baseline Vision Assessment?: Yes Eye Alignment: Within Functional Limits Ocular Range of Motion: Within Functional Limits Alignment/Gaze Preference: Within Defined Limits Tracking/Visual Pursuits: Able to track stimulus in all quads without difficulty Additional Comments: Able to utilize vision functionally throughout assessment.      Perception     Praxis      Pertinent Vitals/Pain Pain Assessment: No/denies pain     Hand Dominance Right   Extremity/Trunk Assessment Upper Extremity Assessment Upper Extremity Assessment: LUE deficits/detail LUE Deficits / Details: Strength WFL. Decreased sensation for light touch but in tact for temperature recognition.  LUE Sensation: decreased light touch   Lower Extremity Assessment Lower Extremity Assessment: Defer to PT evaluation   Cervical / Trunk Assessment Cervical / Trunk Assessment: Other exceptions Cervical / Trunk Exceptions: h/o lumbar surgery   Communication Communication Communication: No difficulties   Cognition Arousal/Alertness: Awake/alert Behavior During Therapy: Flat affect Overall Cognitive Status: Within Functional Limits for tasks assessed  General Comments  husband present during assessment    Exercises     Shoulder Instructions      Home Living Family/patient expects to be discharged to:: Private residence Living Arrangements: Spouse/significant other Available Help at Discharge: Family;Available 24 hours/day;Other (Comment)(over the weekend but husband works) Type of Home: House                           Additional Comments: Need to clarify home set-up.      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  Completely independent PTA        OT Problem List: Decreased activity tolerance;Impaired balance (sitting and/or standing);Impaired sensation      OT Treatment/Interventions: Self-care/ADL training;Therapeutic exercise;Energy conservation;DME and/or AE instruction;Therapeutic activities;Patient/family education;Balance training    OT Goals(Current goals can be found in the care plan section) Acute Rehab OT Goals Patient Stated Goal: to go home later today OT Goal Formulation: With patient/family Time For Goal Achievement: 05/11/17 Potential to Achieve Goals: Good ADL Goals Pt Will Perform Grooming: Independently;standing(3 consecutive tasks with no nausea) Additional ADL Goal #1: Pt will report 2 strategies to protect L UE during IADL tasks involving sharp or hot objects.  OT Frequency: Min 1X/week   Barriers to D/C:            Co-evaluation              AM-PAC PT "6 Clicks" Daily Activity     Outcome Measure Help from another person eating meals?: None Help from another person taking care of personal grooming?: None Help from another person toileting, which includes using toliet, bedpan, or urinal?: None Help from another person bathing (including washing, rinsing, drying)?: None Help from another person to put on and taking off regular upper body clothing?: None Help from another person to put on and taking off regular lower body clothing?: None 6 Click Score: 24   End of Session Nurse Communication: Mobility status;Other (comment)(pt needs IV briefly disconnected to don gown)  Activity Tolerance: Patient tolerated treatment well Patient left: in bed;with call bell/phone within reach;with family/visitor present  OT Visit Diagnosis: Other abnormalities of gait and mobility (R26.89);Other (comment)(decreased LUE sensation)                Time: 2956-2130 OT Time Calculation (min): 14 min Charges:  OT General Charges $OT Visit: 1 Visit OT Evaluation $OT  Eval Low Complexity: 1 Low G-Codes:     Doristine Section, MS OTR/L  Pager: [phone]   Autumn Green A Autumn Green 04/27/2017, 1:17 PM

## 2017-04-27 NOTE — Progress Notes (Signed)
STROKE TEAM PROGRESS NOTE   HISTORY OF PRESENT ILLNESS (per record) Autumn Green is an 57 y.o. female with a past medical history of hypertension and dyslipidemia who presents to the ED with complaints of left-sided paresthesia involving her face, arm and leg which started acutely around 12:30 PM today.  Per patient she was in her usual state of health when around 12:30 PM while cooking, she developed some mild chest pain and a strange feeling on the left side of her body-which she describes as sharp pain radiating from her shoulders to the left side of her face and top of her head, down her left arm and her left leg.  Patient admits to associated symptoms of lightheadedness.  At that time she was holding cooking utensils and she denies extremity weakness, headache, blurred vision, syncope.  Husband denies dysarthria, dysphagia, word finding issues, confusion, loss consciousness or jerking of the extremities during the episode.    She denies history of headache, but over the past week she has had intermittent left and frontal sharp achy headaches which are relieved by Tylenol.  She denies aura, vision changes or neuro deficits associated with it. She came to the ER for evaluation, initial CT of the head was negative.  Patient admits to continuous paresthesias but denies extremity weakness and has been able to ambulate without difficulties.  Neuro was consulted for further evaluation  LSN: 04/26/17 - 1230pm tPA Given: No: No evidence of LVO  Premorbid modified Rankin scale (mRS):0    SUBJECTIVE (INTERVAL HISTORY) Her husband is at bedside. She feels well and wants to go home. She was not on ASA at home.    OBJECTIVE Temp:  [97.6 F (36.4 C)-98.6 F (37 C)] 98.3 F (36.8 C) (02/16 1338) Pulse Rate:  [58-88] 58 (02/16 1338) Cardiac Rhythm: Normal sinus rhythm (02/16 0701) Resp:  [14-24] 20 (02/16 1338) BP: (128-162)/(51-80) 155/59 (02/16 1338) SpO2:  [97 %-100 %] 98 % (02/16  1338)  CBC:  Recent Labs  Lab 04/26/17 1504 04/26/17 1523  WBC 6.6  --   NEUTROABS 3.7  --   HGB 11.7* 12.2  HCT 35.6* 36.0  MCV 89.4  --   PLT 376  --     Basic Metabolic Panel:  Recent Labs  Lab 04/26/17 1504 04/26/17 1523  NA 143 142  K 3.3* 3.4*  CL 109 108  CO2 24  --   GLUCOSE 112* 103*  BUN 17 20  CREATININE 0.87 0.80  CALCIUM 9.6  --     Lipid Panel:     Component Value Date/Time   CHOL 258 (H) 04/26/2017 1700   TRIG 152 (H) 04/26/2017 1700   HDL 65 04/26/2017 1700   CHOLHDL 4.0 04/26/2017 1700   VLDL 30 04/26/2017 1700   LDLCALC 163 (H) 04/26/2017 1700   HgbA1c:  Lab Results  Component Value Date   HGBA1C 6.1 (H) 04/26/2017   Urine Drug Screen:     Component Value Date/Time   LABOPIA NONE DETECTED 04/26/2017 1724   COCAINSCRNUR NONE DETECTED 04/26/2017 1724   LABBENZ NONE DETECTED 04/26/2017 1724   AMPHETMU NONE DETECTED 04/26/2017 1724   THCU NONE DETECTED 04/26/2017 1724   LABBARB NONE DETECTED 04/26/2017 1724    Alcohol Level     Component Value Date/Time   ETH <10 04/26/2017 1504    IMAGING   Dg Chest 2 View 04/26/2017 IMPRESSION:  No active cardiopulmonary disease.    Ct Head Wo Contrast 04/26/2017 IMPRESSION:  Negative CT of  the head.   Mr Maxine Glenn Neck W Wo Contrast 04/26/2017 IMPRESSION:  1. Chronic ischemic white matter findings without acute intracranial abnormality.  2. Normal MRAs of the head and neck.      Transthoracic Echocardiogram  04/27/2017 Study Conclusions  - Left ventricle: The cavity size was normal. The estimated   ejection fraction was in the range of 55% to 60%. Wall motion was   normal; there were no regional wall motion abnormalities. Doppler   parameters are consistent with abnormal left ventricular   relaxation (grade 1 diastolic dysfunction). - Mitral valve: There was mild to moderate regurgitation. - Left atrium: The atrium was mildly dilated. - Pulmonary arteries: PA peak pressure: 35 mm  Hg (S).   Bilateral Carotid Dopplers - 1-39% ICA stenosis      PHYSICAL EXAM Vitals:   04/27/17 0402 04/27/17 0602 04/27/17 1001 04/27/17 1338  BP: 138/79 (!) 150/69 (!) 149/71 (!) 155/59  Pulse: 63 (!) 59 67 (!) 58  Resp:   20 20  Temp:   97.6 F (36.4 C) 98.3 F (36.8 C)  TempSrc:   Oral Oral  SpO2: 98% 99% 99% 98%    Physical exam: Exam: Gen: NAD, conversant, well nourised, obese, well groomed                     CV: RRR, no MRG. No Carotid Bruits. No peripheral edema, warm, nontender Eyes: Conjunctivae clear without exudates or hemorrhage  Neuro: Detailed Neurologic Exam  Speech:    Speech is normal; fluent and spontaneous with normal comprehension.  Cognition:    The patient is oriented to person, place, and time;   Cranial Nerves:    The pupils are equal, round, and reactive to light. Visual fields are full to finger confrontation. Extraocular movements are intact. Trigeminal sensation is intact and the muscles of mastication are normal. The face is symmetric. The palate elevates in the midline. Hearing intact. Voice is normal. Shoulder shrug is normal. The tongue has normal motion without fasciculations.   Coordination:    Normal finger to nose and heel to shin. Normal rapid alternating movements.   Motor Observation:    No asymmetry, no atrophy, and no involuntary movements noted. Tone:    Normal muscle tone.     Strength:    Strength is V/V in the upper and lower limbs.      Sensation: intact to LT     Reflex Exam:  DTR's:    Deep tendon reflexes in the upper and lower extremities are symmetrical bilaterally.   Toes:    The toes are downgoing bilaterally.   Clonus:    Clonus is absent.     HOME MEDICATIONS:  Medications Prior to Admission  Medication Sig Dispense Refill  . albuterol (PROVENTIL HFA;VENTOLIN HFA) 108 (90 Base) MCG/ACT inhaler Inhale 2 puffs into the lungs every 4 (four) hours as needed for wheezing or shortness of breath. 1  Inhaler 0  . cycloSPORINE (RESTASIS) 0.05 % ophthalmic emulsion Place 1 drop into both eyes 2 (two) times daily.    Marland Kitchen diltiazem (DILACOR XR) 180 MG 24 hr capsule Take 180 mg by mouth daily.    . ferrous sulfate 325 (65 FE) MG tablet Take 325 mg by mouth 3 (three) times a week.    . levothyroxine (SYNTHROID, LEVOTHROID) 75 MCG tablet Take 75 mcg by mouth daily before breakfast.    . omeprazole (PRILOSEC) 20 MG capsule Take 20 mg by mouth daily.    Marland Kitchen  benzonatate (TESSALON) 100 MG capsule Take 1 capsule (100 mg total) by mouth every 8 (eight) hours. (Patient not taking: Reported on 04/26/2017) 15 capsule 0  . doxycycline (VIBRAMYCIN) 100 MG capsule Take 1 capsule (100 mg total) by mouth 2 (two) times daily. (Patient not taking: Reported on 04/26/2017) 20 capsule 0      HOSPITAL MEDICATIONS:  . aspirin EC  81 mg Oral Daily  . atorvastatin  80 mg Oral q1800  . cycloSPORINE  1 drop Both Eyes BID  . enoxaparin (LOVENOX) injection  40 mg Subcutaneous Q24H  . ferrous sulfate  325 mg Oral Once per day on Mon Wed Fri  . levothyroxine  75 mcg Oral QAC breakfast  . pantoprazole  40 mg Oral Daily         ASSESSMENT/PLAN Ms. Elmer BalesBetty J Urquiza is a 57 y.o. female with history of hyperlipidemia, hypertension, and hypothyroidism presenting with left-sided paresthesias and lightheadedness with recent sharp frontal headaches. She did not receive IV t-PA due to   Possible TIA:    Resultant  resolved  CT head - Negative CT of the head.   MRI head - Chronic ischemic white matter findings without acute intracranial abnormality.   MRA head - negative  MRA Neck - negative  Carotid Doppler - 1-39% ICA stenosis  2D Echo -  55% to 60%. No cardiac source of emboli identified.  LDL - 163  HgbA1c - 6.1  VTE prophylaxis - Lovenox Fall precautions Diet Heart Room service appropriate? Yes; Fluid consistency: Thin  No antithrombotic prior to admission, now on aspirin 81 mg daily. Recommend 325mg  on  discharge.  Patient counseled to be compliant with her antithrombotic medications  Ongoing aggressive stroke risk factor management  Therapy recommendations:  No follow-up therapies recommended  Disposition:  Pending  Hypertension  Stable  Permissive hypertension (OK if < 220/120) but gradually normalize in 5-7 days  Long-term BP goal normotensive  Hyperlipidemia  Home meds: No lipid lowering medications prior to admission.  LDL 163, goal < 70  Add Lipitor 80 mg daily  Continue statin at discharge   Other Stroke Risk Factors   recommend weight loss, diet and exercise as appropriate   Family hx stroke (brother)   Other Active Problems  Mild hypokalemia   Plan / Recommendations   Recommend asa 325mg  on discharge unless contraindicated due to previous bleed then asa 81mg  sufficient  Needs management of cholesterol and HTN   Prediabetes: f/u putpatient  Stroke team will sign off at this time   Hospital day # 0  Personally examined patient and images, and have participated in and made any corrections needed to history, physical, neuro exam,assessment and plan as stated above.  I have personally obtained the history, evaluated lab date, reviewed imaging studies and agree with radiology interpretations.    Naomie DeanAntonia Ahern, MD Stroke Neurology    To contact Stroke Continuity provider, please refer to [email or web address]. After hours, contact General Neurology

## 2017-04-27 NOTE — Progress Notes (Signed)
VASCULAR LAB PRELIMINARY  PRELIMINARY  PRELIMINARY  PRELIMINARY  Carotid duplex completed.    Preliminary report:  1-39% ICA stenosis.  Vertebral artery flow is antegrade.   Socorro Ebron, RVT 04/27/2017, 11:06 AM

## 2017-04-27 NOTE — Progress Notes (Signed)
Patient requesting to be discharged  If all results are good.MD made aware

## 2017-04-27 NOTE — Evaluation (Signed)
Physical Therapy Evaluation Patient Details Name: Autumn Green MRN: [id number] DOB: 23-May-1960 Today's Date: 04/27/2017   History of Present Illness  57 y.o. female admitted on 04/26/17 for left sided tingling.  CT and MRI both clear.  Suspected TIA.  Pt with significant PMH of TB, HTN, and lumbar disc surgery.  Clinical Impression  Pt with some mild lingering symptoms of numbness left UE/LE and nausea with gait (without reports of dizziness).  Strength is grossly normal in bil LEs as well as coordination. Supervision for safety during gait due to mildly staggering gait pattern. PT to follow acutely until d/c confirmed.   BEFAST education reviewed.   Follow Up Recommendations No PT follow up;Supervision for mobility/OOB(for the initial 24-48 hours)    Equipment Recommendations  None recommended by PT    Recommendations for Other Services       Precautions / Restrictions Precautions Precautions: None      Mobility  Bed Mobility Overal bed mobility: Independent                Transfers Overall transfer level: Needs assistance Equipment used: None Transfers: Sit to/from Stand Sit to Stand: Supervision         General transfer comment: supervision for safety  Ambulation/Gait Ambulation/Gait assistance: Supervision Ambulation Distance (Feet): 150 Feet Assistive device: None Gait Pattern/deviations: Step-through pattern;Staggering right;Staggering left Gait velocity: decreased Gait velocity interpretation: Below normal speed for age/gender General Gait Details: Pt with mildly staggering gait pattern, needing supervision for safety.  Pt able to self correct.  Pt reporting nausea without dizziness during gait.  Did not eat breakfast.       Modified Rankin (Stroke Patients Only) Modified Rankin (Stroke Patients Only) Pre-Morbid Rankin Score: No symptoms Modified Rankin: Moderately severe disability     Balance Overall balance assessment: Needs  assistance Sitting-balance support: No upper extremity supported Sitting balance-Leahy Scale: Good     Standing balance support: No upper extremity supported Standing balance-Leahy Scale: Good Standing balance comment: some noticeable deficits, but only supervision required.                              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  Completely independent PTA     Hand Dominance   Dominant Hand: Right    Extremity/Trunk Assessment   Upper Extremity Assessment Upper Extremity Assessment: Defer to OT evaluation    Lower Extremity Assessment Lower Extremity Assessment: LLE deficits/detail LLE Deficits / Details: Strength per bed level MMT WNL, functional coordination during gait WNL.   LLE Sensation: decreased light touch(from knee down) LLE Coordination: WNL    Cervical / Trunk Assessment Cervical / Trunk Assessment: Other exceptions Cervical / Trunk Exceptions: h/o lumbar surgery  Communication   Communication: No difficulties  Cognition Arousal/Alertness: Awake/alert Behavior During Therapy: Flat affect Overall Cognitive Status: Within Functional Limits for tasks assessed  Assessment/Plan    PT Assessment Patient needs continued PT services  PT Problem List Decreased strength;Decreased activity tolerance;Decreased balance;Decreased mobility;Decreased knowledge of use of DME;Decreased knowledge of precautions;Impaired sensation       PT Treatment Interventions DME  instruction;Gait training;Stair training;Functional mobility training;Therapeutic activities;Therapeutic exercise;Balance training;Neuromuscular re-education;Patient/family education    PT Goals (Current goals can be found in the Care Plan section)  Acute Rehab PT Goals Patient Stated Goal: to go home later today PT Goal Formulation: With patient Time For Goal Achievement: 05/11/17 Potential to Achieve Goals: Good    Frequency Min 4X/week           AM-PAC PT "6 Clicks" Daily Activity  Outcome Measure Difficulty turning over in bed (including adjusting bedclothes, sheets and blankets)?: None Difficulty moving from lying on back to sitting on the side of the bed? : None Difficulty sitting down on and standing up from a chair with arms (e.g., wheelchair, bedside commode, etc,.)?: None Help needed moving to and from a bed to chair (including a wheelchair)?: None Help needed walking in hospital room?: None Help needed climbing 3-5 steps with a railing? : A Little 6 Click Score: 23    End of Session   Activity Tolerance: Other (comment)(limited by nausea) Patient left: in bed;with call bell/phone within reach;with family/visitor present   PT Visit Diagnosis: Unsteadiness on feet (R26.81);Other symptoms and signs involving the nervous system (K44.010(R29.898)    Time: [id number] PT Time Calculation (min) (ACUTE ONLY): 19 min   Charges:         Lurena Joinerebecca B. Tinea Nobile, PT, DPT [phone]   PT Evaluation $PT Eval Moderate Complexity: 1 Mod     04/27/2017, 9:11 AM

## 2017-04-27 NOTE — Progress Notes (Signed)
Patient ID: Autumn Green, female   DOB: Jul 30, 1960, 57 y.o.   MRN: [id number]  PROGRESS NOTE    ALIANNAH Green  BMW:[id number] DOB: March 30, 1960 DOA: 04/26/2017  PCP: Dorothyann Peng, MD   Brief Narrative:  57 y.o. female with a past medical history of hypertension and dyslipidemia who presents to the ED with complaints of left-sided paresthesia involving her face, arm and leg started on the day of the admission. CT head on admission was negative. MRI brain showed chronic ischemic white matter findings without acute intracranial abnormality. Normal MRAs of the head and neck. Neurology has seen the pt in consultation   Assessment & Plan:   Principal Problem:   Left sided numbness - Stroke / TIA work up initiated - Aspirin daily - MRI brain / MRA brain - showed chronic ischemic white matter findings without acute intracranial abnormality. Normal MRAs of the head and neck - CT head without acute findings  - 2D ECHO - EF 55%, grade 1 DD - Carotid doppler - pending  - HgbA1c is 6.1, LDL 163. LDL goal < 100. Patient on Lipitor 80 mg at bedtime   - Diet: regular  - Therapy: PT/OT - none recommended  - Appreciate neurology following  Active Problems:   Hypertension, essential  - BP stable     Hyperlipidemia - Continue Lipitor    Hypothyroidism - Continue synthroid    DVT prophylaxis: Lovenox subQ Code Status: full code  Family Communication: family at bedside  Disposition Plan: home in am once cleared by cardio    Consultants:   Neurology   Procedures:   ECHO - EF 55%, grade 1 DD  Antimicrobials:   None    Subjective: No overnight events.  Objective: Vitals:   04/27/17 0602 04/27/17 1001 04/27/17 1338 04/27/17 1715  BP: (!) 150/69 (!) 149/71 (!) 155/59 (!) 134/49  Pulse: (!) 59 67 (!) 58 63  Resp:  20 20 16   Temp:  97.6 F (36.4 C) 98.3 F (36.8 C) 98.4 F (36.9 C)  TempSrc:  Oral Oral Oral  SpO2: 99% 99% 98% 98%    Intake/Output Summary (Last 24 hours) at  04/27/2017 2045 Last data filed at 04/27/2017 2725 Gross per 24 hour  Intake 1706.67 ml  Output -  Net 1706.67 ml   There were no vitals filed for this visit.  Examination:  General exam: Appears calm and comfortable  Respiratory system: Clear to auscultation. Respiratory effort normal. Cardiovascular system: S1 & S2 heard, RRR.  Gastrointestinal system: Abdomen is nondistended, soft and nontender. No organomegaly or masses felt. Normal bowel sounds heard. Central nervous system: Alert and oriented. No focal neurological deficits. Extremities: Symmetric 5 x 5 power. Skin: No rashes, lesions or ulcers Psychiatry: Judgement and insight appear normal. Mood & affect appropriate.   Data Reviewed: I have personally reviewed following labs and imaging studies  CBC: Recent Labs  Lab 04/26/17 1504 04/26/17 1523  WBC 6.6  --   NEUTROABS 3.7  --   HGB 11.7* 12.2  HCT 35.6* 36.0  MCV 89.4  --   PLT 376  --    Basic Metabolic Panel: Recent Labs  Lab 04/26/17 1504 04/26/17 1523  NA 143 142  K 3.3* 3.4*  CL 109 108  CO2 24  --   GLUCOSE 112* 103*  BUN 17 20  CREATININE 0.87 0.80  CALCIUM 9.6  --    GFR: CrCl cannot be calculated (Unknown ideal weight.). Liver Function Tests: Recent Labs  Lab 04/26/17  1504  AST 23  ALT 20  ALKPHOS 67  BILITOT 0.5  PROT 6.7  ALBUMIN 3.4*   No results for input(s): LIPASE, AMYLASE in the last 168 hours. No results for input(s): AMMONIA in the last 168 hours. Coagulation Profile: Recent Labs  Lab 04/26/17 1504  INR 1.02   Cardiac Enzymes: No results for input(s): CKTOTAL, CKMB, CKMBINDEX, TROPONINI in the last 168 hours. BNP (last 3 results) No results for input(s): PROBNP in the last 8760 hours. HbA1C: Recent Labs    04/26/17 1504  HGBA1C 6.1*   CBG: Recent Labs  Lab 04/26/17 1511  GLUCAP 106*   Lipid Profile: Recent Labs    04/26/17 1700  CHOL 258*  HDL 65  LDLCALC 163*  TRIG 152*  CHOLHDL 4.0   Thyroid  Function Tests: No results for input(s): TSH, T4TOTAL, FREET4, T3FREE, THYROIDAB in the last 72 hours. Anemia Panel: Recent Labs    04/26/17 1504  VITAMINB12 642   Urine analysis:    Component Value Date/Time   COLORURINE YELLOW 04/26/2017 1724   APPEARANCEUR HAZY (A) 04/26/2017 1724   LABSPEC 1.026 04/26/2017 1724   PHURINE 5.0 04/26/2017 1724   GLUCOSEU NEGATIVE 04/26/2017 1724   HGBUR NEGATIVE 04/26/2017 1724   BILIRUBINUR NEGATIVE 04/26/2017 1724   KETONESUR 5 (A) 04/26/2017 1724   PROTEINUR NEGATIVE 04/26/2017 1724   UROBILINOGEN 0.2 03/11/2014 1700   NITRITE NEGATIVE 04/26/2017 1724   LEUKOCYTESUR NEGATIVE 04/26/2017 1724   Sepsis Labs: @LABRCNTIP (procalcitonin:4,lacticidven:4)   )No results found for this or any previous visit (from the past 240 hour(s)).    Radiology Studies: Dg Chest 2 View  Result Date: 04/26/2017 CLINICAL DATA:  Dizziness and chest tenderness radiating to the left side EXAM: CHEST  2 VIEW COMPARISON:  08/08/2016 FINDINGS: The heart size and mediastinal contours are within normal limits. Both lungs are clear. The visualized skeletal structures are unremarkable. IMPRESSION: No active cardiopulmonary disease. Electronically Signed   By: Elige Ko   On: 04/26/2017 16:18   Ct Head Wo Contrast  Result Date: 04/26/2017 CLINICAL DATA:  Dizziness. New onset left-sided numbness beginning 3 hours ago. EXAM: CT HEAD WITHOUT CONTRAST TECHNIQUE: Contiguous axial images were obtained from the base of the skull through the vertex without intravenous contrast. COMPARISON:  CT head without contrast 07/09/2006 FINDINGS: Brain: No acute infarct, hemorrhage, or mass lesion is present. The ventricles are of normal size. No significant extraaxial fluid collection is present. No significant white matter disease is present. Brainstem and cerebellum are normal. Vascular: No hyperdense vessel or unexpected calcification. Skull: Calvarium is intact. No focal lytic or blastic  lesions are present. The visualized extracranial soft tissues are within normal limits. Sinuses/Orbits: A remote right orbital blowout fracture is present. The paranasal sinuses are clear. Mastoid air cells are clear. Globes and orbits are otherwise within normal limits. IMPRESSION: Negative CT of the head. Electronically Signed   By: Marin Roberts M.D.   On: 04/26/2017 15:24   Mr Maxine Glenn Neck W Wo Contrast  Result Date: 04/26/2017 CLINICAL DATA:  Transient ischemic attack EXAM: MR HEAD WITHOUT CONTRAST MR CIRCLE OF WILLIS WITHOUT CONTRAST MRA OF THE NECK WITHOUT AND WITH CONTRAST TECHNIQUE: Multiplanar, multiecho pulse sequences of the brain, circle of willis and surrounding structures were obtained without intravenous contrast. Angiographic images of the neck were obtained using MRA technique without and with intravenous contrast. CONTRAST:  20mL MULTIHANCE GADOBENATE DIMEGLUMINE 529 MG/ML IV SOLN COMPARISON:  Head CT 04/26/2017 FINDINGS: MRI HEAD FINDINGS Brain: The midline structures  are normal. No focal diffusion restriction to indicate acute infarct. No intraparenchymal hemorrhage. Multifocal juxtacortical and deep white matter hyperintensity, most often a result of chronic microvascular ischemia. Focus of subcortical hyperintensity in the right frontal lobe may be an old infarct. No mass lesion. No chronic microhemorrhage or cerebral amyloid angiopathy. No hydrocephalus, age advanced atrophy or lobar predominant volume loss. No dural abnormality or extra-axial collection. Skull and upper cervical spine: The visualized skull base, calvarium, upper cervical spine and extracranial soft tissues are normal. Sinuses/Orbits: No fluid levels or advanced mucosal thickening. No mastoid effusion. Normal orbits. MRA HEAD FINDINGS Intracranial internal carotid arteries: Normal. Anterior cerebral arteries: Normal. Middle cerebral arteries: Normal. Posterior communicating arteries: Absent bilaterally. Posterior  cerebral arteries: Normal. Basilar artery: Normal. Vertebral arteries: Right dominant.  Normal. Superior cerebellar arteries: Normal. Anterior inferior cerebellar arteries: Normal. Posterior inferior cerebellar arteries: Normal. MRA NECK FINDINGS Aortic arch: Normal 3 vessel aortic branching pattern. The visualized subclavian arteries are normal. Right carotid system: Normal course and caliber without stenosis or evidence of dissection. Left carotid system: Normal course and caliber without stenosis or evidence of dissection. Vertebral arteries: Right dominant. Vertebral artery origins are normal. Vertebral arteries are normal in course and caliber to the vertebrobasilar confluence without stenosis or evidence of dissection. IMPRESSION: 1. Chronic ischemic white matter findings without acute intracranial abnormality. 2. Normal MRAs of the head and neck. Electronically Signed   By: Deatra RobinsonKevin  Herman M.D.   On: 04/26/2017 20:28   Mr Brain Wo Contrast  Result Date: 04/26/2017 CLINICAL DATA:  Transient ischemic attack EXAM: MR HEAD WITHOUT CONTRAST MR CIRCLE OF WILLIS WITHOUT CONTRAST MRA OF THE NECK WITHOUT AND WITH CONTRAST TECHNIQUE: Multiplanar, multiecho pulse sequences of the brain, circle of willis and surrounding structures were obtained without intravenous contrast. Angiographic images of the neck were obtained using MRA technique without and with intravenous contrast. CONTRAST:  20mL MULTIHANCE GADOBENATE DIMEGLUMINE 529 MG/ML IV SOLN COMPARISON:  Head CT 04/26/2017 FINDINGS: MRI HEAD FINDINGS Brain: The midline structures are normal. No focal diffusion restriction to indicate acute infarct. No intraparenchymal hemorrhage. Multifocal juxtacortical and deep white matter hyperintensity, most often a result of chronic microvascular ischemia. Focus of subcortical hyperintensity in the right frontal lobe may be an old infarct. No mass lesion. No chronic microhemorrhage or cerebral amyloid angiopathy. No  hydrocephalus, age advanced atrophy or lobar predominant volume loss. No dural abnormality or extra-axial collection. Skull and upper cervical spine: The visualized skull base, calvarium, upper cervical spine and extracranial soft tissues are normal. Sinuses/Orbits: No fluid levels or advanced mucosal thickening. No mastoid effusion. Normal orbits. MRA HEAD FINDINGS Intracranial internal carotid arteries: Normal. Anterior cerebral arteries: Normal. Middle cerebral arteries: Normal. Posterior communicating arteries: Absent bilaterally. Posterior cerebral arteries: Normal. Basilar artery: Normal. Vertebral arteries: Right dominant.  Normal. Superior cerebellar arteries: Normal. Anterior inferior cerebellar arteries: Normal. Posterior inferior cerebellar arteries: Normal. MRA NECK FINDINGS Aortic arch: Normal 3 vessel aortic branching pattern. The visualized subclavian arteries are normal. Right carotid system: Normal course and caliber without stenosis or evidence of dissection. Left carotid system: Normal course and caliber without stenosis or evidence of dissection. Vertebral arteries: Right dominant. Vertebral artery origins are normal. Vertebral arteries are normal in course and caliber to the vertebrobasilar confluence without stenosis or evidence of dissection. IMPRESSION: 1. Chronic ischemic white matter findings without acute intracranial abnormality. 2. Normal MRAs of the head and neck. Electronically Signed   By: Deatra RobinsonKevin  Herman M.D.   On: 04/26/2017 20:28   Mr Maxine GlennMra  Head Wo Contrast  Result Date: 04/26/2017 CLINICAL DATA:  Transient ischemic attack EXAM: MR HEAD WITHOUT CONTRAST MR CIRCLE OF WILLIS WITHOUT CONTRAST MRA OF THE NECK WITHOUT AND WITH CONTRAST TECHNIQUE: Multiplanar, multiecho pulse sequences of the brain, circle of willis and surrounding structures were obtained without intravenous contrast. Angiographic images of the neck were obtained using MRA technique without and with intravenous  contrast. CONTRAST:  20mL MULTIHANCE GADOBENATE DIMEGLUMINE 529 MG/ML IV SOLN COMPARISON:  Head CT 04/26/2017 FINDINGS: MRI HEAD FINDINGS Brain: The midline structures are normal. No focal diffusion restriction to indicate acute infarct. No intraparenchymal hemorrhage. Multifocal juxtacortical and deep white matter hyperintensity, most often a result of chronic microvascular ischemia. Focus of subcortical hyperintensity in the right frontal lobe may be an old infarct. No mass lesion. No chronic microhemorrhage or cerebral amyloid angiopathy. No hydrocephalus, age advanced atrophy or lobar predominant volume loss. No dural abnormality or extra-axial collection. Skull and upper cervical spine: The visualized skull base, calvarium, upper cervical spine and extracranial soft tissues are normal. Sinuses/Orbits: No fluid levels or advanced mucosal thickening. No mastoid effusion. Normal orbits. MRA HEAD FINDINGS Intracranial internal carotid arteries: Normal. Anterior cerebral arteries: Normal. Middle cerebral arteries: Normal. Posterior communicating arteries: Absent bilaterally. Posterior cerebral arteries: Normal. Basilar artery: Normal. Vertebral arteries: Right dominant.  Normal. Superior cerebellar arteries: Normal. Anterior inferior cerebellar arteries: Normal. Posterior inferior cerebellar arteries: Normal. MRA NECK FINDINGS Aortic arch: Normal 3 vessel aortic branching pattern. The visualized subclavian arteries are normal. Right carotid system: Normal course and caliber without stenosis or evidence of dissection. Left carotid system: Normal course and caliber without stenosis or evidence of dissection. Vertebral arteries: Right dominant. Vertebral artery origins are normal. Vertebral arteries are normal in course and caliber to the vertebrobasilar confluence without stenosis or evidence of dissection. IMPRESSION: 1. Chronic ischemic white matter findings without acute intracranial abnormality. 2. Normal MRAs of  the head and neck. Electronically Signed   By: Deatra Robinson M.D.   On: 04/26/2017 20:28     Scheduled Meds: . aspirin EC  81 mg Oral Daily  . atorvastatin  80 mg Oral q1800  . enoxaparin   40 mg Subcutaneous Q24H  . ferrous sulfate  325 mg Oral Once per day on Mon Wed Fri  . levothyroxine  75 mcg Oral QAC breakfast  . pantoprazole  40 mg Oral Daily   Continuous Infusions:   LOS: 0 days    Time spent: 25 minutes  Greater than 50% of the time spent on counseling and coordinating the care.   Manson Passey, MD [facility] Pager [phone]  If 7PM-7AM, please contact night-coverage [email or web address] Password [facility] 04/27/2017, 8:45 PM

## 2017-04-27 NOTE — Progress Notes (Signed)
  Echocardiogram 2D Echocardiogram has been performed.  Delcie RochENNINGTON, Haylie Mccutcheon 04/27/2017, 11:00 AM

## 2017-04-28 DIAGNOSIS — R200 Anesthesia of skin: Secondary | ICD-10-CM | POA: Diagnosis not present

## 2017-04-28 DIAGNOSIS — R202 Paresthesia of skin: Secondary | ICD-10-CM | POA: Diagnosis not present

## 2017-04-28 LAB — CBC
HCT: 35.8 % — ABNORMAL LOW (ref 36.0–46.0)
HEMOGLOBIN: 11.5 g/dL — AB (ref 12.0–15.0)
MCH: 29.3 pg (ref 26.0–34.0)
MCHC: 32.1 g/dL (ref 30.0–36.0)
MCV: 91.1 fL (ref 78.0–100.0)
PLATELETS: 341 10*3/uL (ref 150–400)
RBC: 3.93 MIL/uL (ref 3.87–5.11)
RDW: 14 % (ref 11.5–15.5)
WBC: 5.3 10*3/uL (ref 4.0–10.5)

## 2017-04-28 LAB — BASIC METABOLIC PANEL
ANION GAP: 8 (ref 5–15)
BUN: 11 mg/dL (ref 6–20)
CHLORIDE: 109 mmol/L (ref 101–111)
CO2: 25 mmol/L (ref 22–32)
Calcium: 9.5 mg/dL (ref 8.9–10.3)
Creatinine, Ser: 0.77 mg/dL (ref 0.44–1.00)
Glucose, Bld: 88 mg/dL (ref 65–99)
POTASSIUM: 4.2 mmol/L (ref 3.5–5.1)
SODIUM: 142 mmol/L (ref 135–145)

## 2017-04-28 MED ORDER — ASPIRIN 325 MG PO TABS: 325.0000 mg | ORAL_TABLET | Freq: Every day | ORAL | 3 refills | Status: AC

## 2017-04-28 NOTE — Evaluation (Signed)
Speech Language Pathology Evaluation Patient Details Name: Autumn Green MRN: [id number] DOB: 1960/06/22 Today's Date: 04/28/2017 Time: [id number] SLP Time Calculation (min) (ACUTE ONLY): 17 min  Problem List:  Patient Active Problem List   Diagnosis Date Noted  . Left sided numbness 04/26/2017  . Hypothyroidism   . Acute bronchitis 08/08/2016  . Chest wall pain 08/08/2016  . Hyperlipidemia 04/17/2016  . Hypertension 08/18/2012   Past Medical History:  Past Medical History:  Diagnosis Date  . High cholesterol   . Hyperlipidemia 04/17/2016  . Hypertension    about 10 years, well controlled with meds  . Hypothyroidism    about 5 years on oral meds  . Spondylosis of lumbar joint   . Tuberculosis    1983-1985, no recurrance   Past Surgical History:  Past Surgical History:  Procedure Laterality Date  . ABDOMINAL HYSTERECTOMY     2004  . BACK SURGERY     2009 dicsectomy  . BREAST SURGERY     had 5 or 6 cyst removed from breast, starting 1979  . LEFT HEART CATHETERIZATION WITH CORONARY ANGIOGRAM N/A 10/14/2012   Procedure: LEFT HEART CATHETERIZATION WITH CORONARY ANGIOGRAM;  Surgeon: Pamella PertJagadeesh R Ganji, MD;  [facility];  Service: Cardiovascular;  Laterality: N/A;  . LUMBAR DISC SURGERY  07/17/2011   decompression   HPI:  57 y.o. female admitted on 04/26/17 for left sided tingling.  CT and MRI both clear.  Suspected TIA.  Pt with significant PMH of TB, HTN, and lumbar disc surgery.   Assessment / Plan / Recommendation Clinical Impression  Patient is currently at her cognitive-linguistic baseline. Speech is clear, fluent, appropriate with no dysarthria noted. Selective attention observed to be [facility]FL with 5 and 6 digit repetition, simple problem solving with environmental distractions present. Pt had difficulty with multiplication, however she feels she would have performed similarly prior to this admission. Functional reasoning and safety awareness intact. Education completed,  no further SLP services indicated at this time. SLP will s/o.     SLP Assessment  SLP Recommendation/Assessment: Patient does not need any further Speech Lanaguage Pathology Services SLP Visit Diagnosis: Cognitive communication deficit (R41.841)    Follow Up Recommendations  None    Frequency and Duration           SLP Evaluation Cognition  Overall Cognitive Status: Within Functional Limits for tasks assessed Arousal/Alertness: Awake/alert Orientation Level: Oriented X4 Attention: Selective Selective Attention: Appears intact Memory: Appears intact Awareness: Appears intact Problem Solving: Appears intact(simple problem solving) Safety/Judgment: Appears intact       Comprehension  Auditory Comprehension Overall Auditory Comprehension: Appears within functional limits for tasks assessed Visual Recognition/Discrimination Discrimination: Within Function Limits Reading Comprehension Reading Status: Not tested    Expression Expression Primary Mode of Expression: Verbal Verbal Expression Overall Verbal Expression: Appears within functional limits for tasks assessed Repetition: No impairment Naming: No impairment Pragmatics: No impairment Written Expression Dominant Hand: Right Written Expression: Not tested   Oral / Motor  Oral Motor/Sensory Function Overall Oral Motor/Sensory Function: Within functional limits Motor Speech Overall Motor Speech: Appears within functional limits for tasks assessed Respiration: Within functional limits Phonation: Normal Resonance: Within functional limits Articulation: Within functional limitis Intelligibility: Intelligible Motor Planning: Witnin functional limits Motor Speech Errors: Not applicable   GO                   Rondel BatonMary Beth Donique Hammonds, MS, CCC-SLP Speech-Language Pathologist [phone]  Arlana LindauMary E Kiri Hinderliter 04/28/2017, 1:52 PM

## 2017-04-28 NOTE — Discharge Instructions (Signed)
 Dyslipidemia Dyslipidemia is an imbalance of waxy, fat-like substances (lipids) in the blood. The body needs lipids in small amounts. Dyslipidemia often involves a high level of cholesterol or triglycerides, which are types of lipids. Common forms of dyslipidemia include:  High levels of bad cholesterol (LDL cholesterol). LDL is the type of cholesterol that causes fatty deposits (plaques) to build up in the blood vessels that carry blood away from your heart (arteries).  Low levels of good cholesterol (HDL cholesterol). HDL cholesterol is the type of cholesterol that protects against heart disease. High levels of HDL remove the LDL buildup from arteries.  High levels of triglycerides. Triglycerides are a fatty substance in the blood that is linked to a buildup of plaques in the arteries.  You can develop dyslipidemia because of the genes you are born with (primary dyslipidemia) or changes that occur during your life (secondary dyslipidemia), or as a side effect of certain medical treatments. What are the causes? Primary dyslipidemia is caused by changes (mutations) in genes that are passed down through families (inherited). These mutations cause several types of dyslipidemia. Mutations can result in disorders that make the body produce too much LDL cholesterol or triglycerides, or not enough HDL cholesterol. These disorders may lead to heart disease, arterial disease, or stroke at an early age. Causes of secondary dyslipidemia include certain lifestyle choices and diseases that lead to dyslipidemia, such as:  Eating a diet that is high in animal fat.  Not getting enough activity or exercise (having a sedentary lifestyle).  Having diabetes, kidney disease, liver disease, or thyroid disease.  Drinking large amounts of alcohol.  Using certain types of drugs.  What increases the risk? You may be at greater risk for dyslipidemia if you are an older man or if you are a woman who has gone  through menopause. Other risk factors include:  Having a family history of dyslipidemia.  Taking certain medicines, including birth control pills, steroids, some diuretics, beta-blockers, and some medicines forHIV.  Smoking cigarettes.  Eating a high-fat diet.  Drinking large amounts of alcohol.  Having certain medical conditions such as diabetes, polycystic ovary syndrome (PCOS), pregnancy, kidney disease, liver disease, or hypothyroidism.  Not exercising regularly.  Being overweight or obese with too much belly fat.  What are the signs or symptoms? Dyslipidemia does not usually cause any symptoms. Very high lipid levels can cause fatty bumps under the skin (xanthomas) or a white or gray ring around the black center (pupil) of the eye. Very high triglyceride levels can cause inflammation of the pancreas (pancreatitis). How is this diagnosed? Your health care provider may diagnose dyslipidemia based on a routine blood test (fasting blood test). Because most people do not have symptoms of the condition, this blood testing (lipid profile) is done on adults age 20 and older and is repeated every 5 years. This test checks:  Total cholesterol. This is a measure of the total amount of cholesterol in your blood, including LDL cholesterol, HDL cholesterol, and triglycerides. A healthy number is below 200.  LDL cholesterol. The target number for LDL cholesterol is different for each person, depending on individual risk factors. For most people, a number below 100 is healthy. Ask your health care provider what your LDL cholesterol number should be.  HDL cholesterol. An HDL level of 60 or higher is best because it helps to protect against heart disease. A number below 40 for men or below 50 for women increases the risk for heart disease.  Triglycerides.   A healthy triglyceride number is below 150.  If your lipid profile is abnormal, your health care provider may do other blood tests to get more  information about your condition. How is this treated? Treatment depends on the type of dyslipidemia that you have and your other risk factors for heart disease and stroke. Your health care provider will have a target range for your lipid levels based on this information. For many people, treatment starts with lifestyle changes, such as diet and exercise. Your health care provider may recommend that you:  Get regular exercise.  Make changes to your diet.  Quit smoking if you smoke.  If diet changes and exercise do not help you reach your goals, your health care provider may also prescribe medicine to lower lipids. The most commonly prescribed type of medicine lowers your LDL cholesterol (statin drug). If you have a high triglyceride level, your provider may prescribe another type of drug (fibrate) or an omega-3 fish oil supplement, or both. Follow these instructions at home:  Take over-the-counter and prescription medicines only as told by your health care provider. This includes supplements.  Get regular exercise. Start an aerobic exercise and strength training program as told by your health care provider. Ask your health care provider what activities are safe for you. Your health care provider may recommend: ? 30 minutes of aerobic activity 4-6 days a week. Brisk walking is an example of aerobic activity. ? Strength training 2 days a week.  Eat a healthy diet as told by your health care provider. This can help you reach and maintain a healthy weight, lower your LDL cholesterol, and raise your HDL cholesterol. It may help to work with a diet and nutrition specialist (dietitian) to make a plan that is right for you. Your dietitian or health care provider may recommend: ? Limiting your calories, if you are overweight. ? Eating more fruits, vegetables, whole grains, fish, and lean meats. ? Limiting saturated fat, trans fat, and cholesterol.  Follow instructions from your health care provider  or dietitian about eating or drinking restrictions.  Limit alcohol intake to no more than one drink per day for nonpregnant women and two drinks per day for men. One drink equals 12 oz of beer, 5 oz of wine, or 1 oz of hard liquor.  Do not use any products that contain nicotine or tobacco, such as cigarettes and e-cigarettes. If you need help quitting, ask your health care provider.  Keep all follow-up visits as told by your health care provider. This is important. Contact a health care provider if:  You are having trouble sticking to your exercise or diet plan.  You are struggling to quit smoking or control your use of alcohol. Summary  Dyslipidemia is an imbalance of waxy, fat-like substances (lipids) in the blood. The body needs lipids in small amounts. Dyslipidemia often involves a high level of cholesterol or triglycerides, which are types of lipids.  Treatment depends on the type of dyslipidemia that you have and your other risk factors for heart disease and stroke.  For many people, treatment starts with lifestyle changes, such as diet and exercise. Your health care provider may also prescribe medicine to lower lipids. This information is not intended to replace advice given to you by your health care provider. Make sure you discuss any questions you have with your health care provider. Document Released: 03/03/2013 Document Revised: 10/24/2015 Document Reviewed: 10/24/2015 Elsevier Interactive Patient Education  2018 [employer]  

## 2017-04-28 NOTE — Discharge Summary (Signed)
Physician Discharge Summary  Autumn Green:[id number] DOB: 1960/10/13 DOA: 04/26/2017  PCP: Dorothyann Peng, MD  Admit date: 04/26/2017 Discharge date: 04/28/2017  Recommendations for Outpatient Follow-up:  1. ASA on discharge 325 mg a day 2. Did not prescribe statin, pt tried multiple statins in past with side effects and reported she is not tolerant to statin  Discharge Diagnoses:  Principal Problem:   Left sided numbness Active Problems:   Hypertension   Hyperlipidemia   Hypothyroidism    Discharge Condition: stable   Diet recommendation: as tolerated   History of present illness:  57 y.o.femalewith a past medical history of hypertensionanddyslipidemia who presents tothe ED with complaints of left-sided paresthesia involving her face, arm and leg started on the day of the admission. CT head on admission was negative. MRI brain showed chronic ischemic white matter findings without acute intracranial abnormality. Normal MRAs of the head and neck. Neurology has seen the pt in consultation   Hospital Course:    Assessment & Plan:   Principal Problem:   Left sided numbness - Stroke / TIA work up initiated - Aspirin daily - MRI brain / MRA brain - showed chronic ischemic white matter findings without acute intracranial abnormality. Normal MRAs of the head and neck - CT head without acute findings  - 2D ECHO - EF 55%, grade 1 DD - HgbA1c is 6.1, LDL 163. As noted above pt reported not tolerating statins due to myalgias  - Diet: regular  - Therapy: PT/OT - none recommended   Active Problems:   Hypertension, essential  - BP stable     Hypothyroidism - Continue synthroid    DVT prophylaxis: Lovenox subQ Code Status: full code  Family Communication: fo family at bedside     Consultants:   Neurology   Procedures:   ECHO - EF 55%, grade 1 DD  Antimicrobials:   None      Signed:  Manson Passey, MD  [facility] 04/28/2017, 12:55  PM  Pager #: [phone]  Time spent in minutes: less than 30 minutes    Discharge Exam: Vitals:   04/28/17 0549 04/28/17 1020  BP: (!) 161/80 (!) 131/54  Pulse: (!) 58 63  Resp: 16 18  Temp: 98 F (36.7 C) 98 F (36.7 C)  SpO2: 99% 95%   Vitals:   04/27/17 2151 04/28/17 0159 04/28/17 0549 04/28/17 1020  BP: (!) 155/75 (!) 151/70 (!) 161/80 (!) 131/54  Pulse: 60 61 (!) 58 63  Resp: 16 16 16 18   Temp: 98.2 F (36.8 C) 98.7 F (37.1 C) 98 F (36.7 C) 98 F (36.7 C)  TempSrc: Oral Oral Oral Oral  SpO2: 100%  99% 95%    General: Pt is alert, follows commands appropriately, not in acute distress Cardiovascular: Regular rate and rhythm, S1/S2 +, no murmurs Respiratory: Clear to auscultation bilaterally, no wheezing, no crackles, no rhonchi Abdominal: Soft, non tender, non distended, bowel sounds +, no guarding Extremities: no edema, no cyanosis, pulses palpable bilaterally DP and PT Neuro: Grossly nonfocal  Discharge Instructions  Discharge Instructions    Call MD for:  persistant nausea and vomiting   Complete by:  As directed    Call MD for:  redness, tenderness, or signs of infection (pain, swelling, redness, odor or green/yellow discharge around incision site)   Complete by:  As directed    Call MD for:  severe uncontrolled pain   Complete by:  As directed    Diet - low sodium heart healthy  Complete by:  As directed    Discharge instructions   Complete by:  As directed    Please talk to your PCP which statin is safe to use considering you have mentioned statins caused myalgias and you have not taken any recently. LDL is in 160's range.   Increase activity slowly   Complete by:  As directed      Allergies as of 04/28/2017      Reactions   Gabapentin Swelling      Medication List    STOP taking these medications   benzonatate 100 MG capsule Commonly known as:  TESSALON   doxycycline 100 MG capsule Commonly known as:  VIBRAMYCIN     TAKE these  medications   albuterol 108 (90 Base) MCG/ACT inhaler Commonly known as:  PROVENTIL HFA;VENTOLIN HFA Inhale 2 puffs into the lungs every 4 (four) hours as needed for wheezing or shortness of breath.   aspirin 325 MG tablet Commonly known as:  BAYER ASPIRIN Take 1 tablet (325 mg total) by mouth daily.   diltiazem 180 MG 24 hr capsule Commonly known as:  DILACOR XR Take 180 mg by mouth daily.   ferrous sulfate 325 (65 FE) MG tablet Take 325 mg by mouth 3 (three) times a week.   levothyroxine 75 MCG tablet Commonly known as:  SYNTHROID, LEVOTHROID Take 75 mcg by mouth daily before breakfast.   omeprazole 20 MG capsule Commonly known as:  PRILOSEC Take 20 mg by mouth daily.   RESTASIS 0.05 % ophthalmic emulsion Generic drug:  cycloSPORINE Place 1 drop into both eyes 2 (two) times daily.      Follow-up Information    Dorothyann Peng, MD. Schedule an appointment as soon as possible for a visit in 1 week(s).   Specialty:  Internal Medicine Contact information: [address] [place] [address] [phone]            The results of significant diagnostics from this hospitalization (including imaging, microbiology, ancillary and laboratory) are listed below for reference.    Significant Diagnostic Studies: Dg Chest 2 View  Result Date: 04/26/2017 CLINICAL DATA:  Dizziness and chest tenderness radiating to the left side EXAM: CHEST  2 VIEW COMPARISON:  08/08/2016 FINDINGS: The heart size and mediastinal contours are within normal limits. Both lungs are clear. The visualized skeletal structures are unremarkable. IMPRESSION: No active cardiopulmonary disease. Electronically Signed   By: Elige Ko   On: 04/26/2017 16:18   Ct Head Wo Contrast  Result Date: 04/26/2017 CLINICAL DATA:  Dizziness. New onset left-sided numbness beginning 3 hours ago. EXAM: CT HEAD WITHOUT CONTRAST TECHNIQUE: Contiguous axial images were obtained from the base of the skull through  the vertex without intravenous contrast. COMPARISON:  CT head without contrast 07/09/2006 FINDINGS: Brain: No acute infarct, hemorrhage, or mass lesion is present. The ventricles are of normal size. No significant extraaxial fluid collection is present. No significant white matter disease is present. Brainstem and cerebellum are normal. Vascular: No hyperdense vessel or unexpected calcification. Skull: Calvarium is intact. No focal lytic or blastic lesions are present. The visualized extracranial soft tissues are within normal limits. Sinuses/Orbits: A remote right orbital blowout fracture is present. The paranasal sinuses are clear. Mastoid air cells are clear. Globes and orbits are otherwise within normal limits. IMPRESSION: Negative CT of the head. Electronically Signed   By: Marin Roberts M.D.   On: 04/26/2017 15:24   Mr Maxine Glenn Neck W Wo Contrast  Result Date: 04/26/2017 CLINICAL DATA:  Transient ischemic attack EXAM: MR HEAD WITHOUT CONTRAST MR CIRCLE OF WILLIS WITHOUT CONTRAST MRA OF THE NECK WITHOUT AND WITH CONTRAST TECHNIQUE: Multiplanar, multiecho pulse sequences of the brain, circle of willis and surrounding structures were obtained without intravenous contrast. Angiographic images of the neck were obtained using MRA technique without and with intravenous contrast. CONTRAST:  20mL MULTIHANCE GADOBENATE DIMEGLUMINE 529 MG/ML IV SOLN COMPARISON:  Head CT 04/26/2017 FINDINGS: MRI HEAD FINDINGS Brain: The midline structures are normal. No focal diffusion restriction to indicate acute infarct. No intraparenchymal hemorrhage. Multifocal juxtacortical and deep white matter hyperintensity, most often a result of chronic microvascular ischemia. Focus of subcortical hyperintensity in the right frontal lobe may be an old infarct. No mass lesion. No chronic microhemorrhage or cerebral amyloid angiopathy. No hydrocephalus, age advanced atrophy or lobar predominant volume loss. No dural abnormality or  extra-axial collection. Skull and upper cervical spine: The visualized skull base, calvarium, upper cervical spine and extracranial soft tissues are normal. Sinuses/Orbits: No fluid levels or advanced mucosal thickening. No mastoid effusion. Normal orbits. MRA HEAD FINDINGS Intracranial internal carotid arteries: Normal. Anterior cerebral arteries: Normal. Middle cerebral arteries: Normal. Posterior communicating arteries: Absent bilaterally. Posterior cerebral arteries: Normal. Basilar artery: Normal. Vertebral arteries: Right dominant.  Normal. Superior cerebellar arteries: Normal. Anterior inferior cerebellar arteries: Normal. Posterior inferior cerebellar arteries: Normal. MRA NECK FINDINGS Aortic arch: Normal 3 vessel aortic branching pattern. The visualized subclavian arteries are normal. Right carotid system: Normal course and caliber without stenosis or evidence of dissection. Left carotid system: Normal course and caliber without stenosis or evidence of dissection. Vertebral arteries: Right dominant. Vertebral artery origins are normal. Vertebral arteries are normal in course and caliber to the vertebrobasilar confluence without stenosis or evidence of dissection. IMPRESSION: 1. Chronic ischemic white matter findings without acute intracranial abnormality. 2. Normal MRAs of the head and neck. Electronically Signed   By: Deatra RobinsonKevin  Herman M.D.   On: 04/26/2017 20:28   Mr Brain Wo Contrast  Result Date: 04/26/2017 CLINICAL DATA:  Transient ischemic attack EXAM: MR HEAD WITHOUT CONTRAST MR CIRCLE OF WILLIS WITHOUT CONTRAST MRA OF THE NECK WITHOUT AND WITH CONTRAST TECHNIQUE: Multiplanar, multiecho pulse sequences of the brain, circle of willis and surrounding structures were obtained without intravenous contrast. Angiographic images of the neck were obtained using MRA technique without and with intravenous contrast. CONTRAST:  20mL MULTIHANCE GADOBENATE DIMEGLUMINE 529 MG/ML IV SOLN COMPARISON:  Head CT  04/26/2017 FINDINGS: MRI HEAD FINDINGS Brain: The midline structures are normal. No focal diffusion restriction to indicate acute infarct. No intraparenchymal hemorrhage. Multifocal juxtacortical and deep white matter hyperintensity, most often a result of chronic microvascular ischemia. Focus of subcortical hyperintensity in the right frontal lobe may be an old infarct. No mass lesion. No chronic microhemorrhage or cerebral amyloid angiopathy. No hydrocephalus, age advanced atrophy or lobar predominant volume loss. No dural abnormality or extra-axial collection. Skull and upper cervical spine: The visualized skull base, calvarium, upper cervical spine and extracranial soft tissues are normal. Sinuses/Orbits: No fluid levels or advanced mucosal thickening. No mastoid effusion. Normal orbits. MRA HEAD FINDINGS Intracranial internal carotid arteries: Normal. Anterior cerebral arteries: Normal. Middle cerebral arteries: Normal. Posterior communicating arteries: Absent bilaterally. Posterior cerebral arteries: Normal. Basilar artery: Normal. Vertebral arteries: Right dominant.  Normal. Superior cerebellar arteries: Normal. Anterior inferior cerebellar arteries: Normal. Posterior inferior cerebellar arteries: Normal. MRA NECK FINDINGS Aortic arch: Normal 3 vessel aortic branching pattern. The visualized subclavian arteries are normal. Right carotid system: Normal course and caliber without stenosis or evidence  of dissection. Left carotid system: Normal course and caliber without stenosis or evidence of dissection. Vertebral arteries: Right dominant. Vertebral artery origins are normal. Vertebral arteries are normal in course and caliber to the vertebrobasilar confluence without stenosis or evidence of dissection. IMPRESSION: 1. Chronic ischemic white matter findings without acute intracranial abnormality. 2. Normal MRAs of the head and neck. Electronically Signed   By: Deatra Robinson M.D.   On: 04/26/2017 20:28   Mr Maxine Glenn  Head Wo Contrast  Result Date: 04/26/2017 CLINICAL DATA:  Transient ischemic attack EXAM: MR HEAD WITHOUT CONTRAST MR CIRCLE OF WILLIS WITHOUT CONTRAST MRA OF THE NECK WITHOUT AND WITH CONTRAST TECHNIQUE: Multiplanar, multiecho pulse sequences of the brain, circle of willis and surrounding structures were obtained without intravenous contrast. Angiographic images of the neck were obtained using MRA technique without and with intravenous contrast. CONTRAST:  20mL MULTIHANCE GADOBENATE DIMEGLUMINE 529 MG/ML IV SOLN COMPARISON:  Head CT 04/26/2017 FINDINGS: MRI HEAD FINDINGS Brain: The midline structures are normal. No focal diffusion restriction to indicate acute infarct. No intraparenchymal hemorrhage. Multifocal juxtacortical and deep white matter hyperintensity, most often a result of chronic microvascular ischemia. Focus of subcortical hyperintensity in the right frontal lobe may be an old infarct. No mass lesion. No chronic microhemorrhage or cerebral amyloid angiopathy. No hydrocephalus, age advanced atrophy or lobar predominant volume loss. No dural abnormality or extra-axial collection. Skull and upper cervical spine: The visualized skull base, calvarium, upper cervical spine and extracranial soft tissues are normal. Sinuses/Orbits: No fluid levels or advanced mucosal thickening. No mastoid effusion. Normal orbits. MRA HEAD FINDINGS Intracranial internal carotid arteries: Normal. Anterior cerebral arteries: Normal. Middle cerebral arteries: Normal. Posterior communicating arteries: Absent bilaterally. Posterior cerebral arteries: Normal. Basilar artery: Normal. Vertebral arteries: Right dominant.  Normal. Superior cerebellar arteries: Normal. Anterior inferior cerebellar arteries: Normal. Posterior inferior cerebellar arteries: Normal. MRA NECK FINDINGS Aortic arch: Normal 3 vessel aortic branching pattern. The visualized subclavian arteries are normal. Right carotid system: Normal course and caliber without  stenosis or evidence of dissection. Left carotid system: Normal course and caliber without stenosis or evidence of dissection. Vertebral arteries: Right dominant. Vertebral artery origins are normal. Vertebral arteries are normal in course and caliber to the vertebrobasilar confluence without stenosis or evidence of dissection. IMPRESSION: 1. Chronic ischemic white matter findings without acute intracranial abnormality. 2. Normal MRAs of the head and neck. Electronically Signed   By: Deatra Robinson M.D.   On: 04/26/2017 20:28    Microbiology: No results found for this or any previous visit (from the past 240 hour(s)).   Labs: Basic Metabolic Panel: Recent Labs  Lab 04/26/17 1504 04/26/17 1523 04/28/17 0939  NA 143 142 142  K 3.3* 3.4* 4.2  CL 109 108 109  CO2 24  --  25  GLUCOSE 112* 103* 88  BUN 17 20 11   CREATININE 0.87 0.80 0.77  CALCIUM 9.6  --  9.5   Liver Function Tests: Recent Labs  Lab 04/26/17 1504  AST 23  ALT 20  ALKPHOS 67  BILITOT 0.5  PROT 6.7  ALBUMIN 3.4*   No results for input(s): LIPASE, AMYLASE in the last 168 hours. No results for input(s): AMMONIA in the last 168 hours. CBC: Recent Labs  Lab 04/26/17 1504 04/26/17 1523 04/28/17 0939  WBC 6.6  --  5.3  NEUTROABS 3.7  --   --   HGB 11.7* 12.2 11.5*  HCT 35.6* 36.0 35.8*  MCV 89.4  --  91.1  PLT 376  --  341   Cardiac Enzymes: No results for input(s): CKTOTAL, CKMB, CKMBINDEX, TROPONINI in the last 168 hours. BNP: BNP (last 3 results) No results for input(s): BNP in the last 8760 hours.  ProBNP (last 3 results) No results for input(s): PROBNP in the last 8760 hours.  CBG: Recent Labs  Lab 04/26/17 1511  GLUCAP 106*

## 2017-04-28 NOTE — Progress Notes (Signed)
Patient for discharge home accompanied by her husband. Medications and discharge instructions explained to the patient. She verbalized understanding. Copies given to her. IV saline lock and tele pack removed. CCMD notified.

## 2017-07-03 DIAGNOSIS — Z7689 Persons encountering health services in other specified circumstances: Secondary | ICD-10-CM | POA: Diagnosis not present

## 2017-07-03 DIAGNOSIS — K921 Melena: Secondary | ICD-10-CM | POA: Diagnosis not present

## 2017-07-03 DIAGNOSIS — R109 Unspecified abdominal pain: Secondary | ICD-10-CM | POA: Diagnosis not present

## 2017-07-03 DIAGNOSIS — R935 Abnormal findings on diagnostic imaging of other abdominal regions, including retroperitoneum: Secondary | ICD-10-CM | POA: Diagnosis not present

## 2017-07-03 DIAGNOSIS — G8929 Other chronic pain: Secondary | ICD-10-CM | POA: Diagnosis not present

## 2017-10-11 DIAGNOSIS — R202 Paresthesia of skin: Secondary | ICD-10-CM | POA: Diagnosis not present

## 2017-10-11 DIAGNOSIS — I10 Essential (primary) hypertension: Secondary | ICD-10-CM | POA: Diagnosis not present

## 2017-10-11 DIAGNOSIS — M79602 Pain in left arm: Secondary | ICD-10-CM | POA: Diagnosis not present

## 2017-10-11 DIAGNOSIS — Z79899 Other long term (current) drug therapy: Secondary | ICD-10-CM | POA: Diagnosis not present

## 2017-10-11 DIAGNOSIS — E785 Hyperlipidemia, unspecified: Secondary | ICD-10-CM | POA: Diagnosis not present

## 2017-10-11 DIAGNOSIS — Z888 Allergy status to other drugs, medicaments and biological substances status: Secondary | ICD-10-CM | POA: Diagnosis not present

## 2017-10-11 DIAGNOSIS — E039 Hypothyroidism, unspecified: Secondary | ICD-10-CM | POA: Diagnosis not present

## 2017-10-11 DIAGNOSIS — Z8673 Personal history of transient ischemic attack (TIA), and cerebral infarction without residual deficits: Secondary | ICD-10-CM | POA: Diagnosis not present

## 2017-10-11 DIAGNOSIS — R0789 Other chest pain: Secondary | ICD-10-CM | POA: Diagnosis not present

## 2017-10-11 DIAGNOSIS — K219 Gastro-esophageal reflux disease without esophagitis: Secondary | ICD-10-CM | POA: Diagnosis not present

## 2017-10-11 DIAGNOSIS — Z7982 Long term (current) use of aspirin: Secondary | ICD-10-CM | POA: Diagnosis not present

## 2017-10-11 DIAGNOSIS — R079 Chest pain, unspecified: Secondary | ICD-10-CM | POA: Diagnosis not present

## 2017-10-12 DIAGNOSIS — E785 Hyperlipidemia, unspecified: Secondary | ICD-10-CM | POA: Diagnosis not present

## 2017-10-12 DIAGNOSIS — R079 Chest pain, unspecified: Secondary | ICD-10-CM | POA: Diagnosis not present

## 2017-10-12 DIAGNOSIS — K219 Gastro-esophageal reflux disease without esophagitis: Secondary | ICD-10-CM | POA: Diagnosis not present

## 2017-10-12 DIAGNOSIS — I10 Essential (primary) hypertension: Secondary | ICD-10-CM | POA: Diagnosis not present

## 2018-02-10 ENCOUNTER — Ambulatory Visit (HOSPITAL_COMMUNITY)
Admission: EM | Admit: 2018-02-10 | Discharge: 2018-02-10 | Disposition: A | Discharge status: Home / Self Care | Payer: Federal, State, Local not specified - PPO | Attending: Internal Medicine | Admitting: Internal Medicine

## 2018-02-10 ENCOUNTER — Encounter (HOSPITAL_COMMUNITY): Payer: Self-pay | Admitting: Emergency Medicine

## 2018-02-10 DIAGNOSIS — R51 Headache: Secondary | ICD-10-CM

## 2018-02-10 DIAGNOSIS — I10 Essential (primary) hypertension: Secondary | ICD-10-CM

## 2018-02-10 DIAGNOSIS — R519 Headache, unspecified: Secondary | ICD-10-CM

## 2018-02-10 MED ORDER — KETOROLAC TROMETHAMINE 30 MG/ML IJ SOLN
INTRAMUSCULAR | Status: AC
  Filled 2018-02-10: qty 1

## 2018-02-10 MED ORDER — KETOROLAC TROMETHAMINE 30 MG/ML IJ SOLN
30.0000 mg | Freq: Once | INTRAMUSCULAR | Status: AC
  Administered 2018-02-10: 30 mg via INTRAMUSCULAR

## 2018-02-10 MED ORDER — FLUTICASONE PROPIONATE 50 MCG/ACT NA SUSP: 1.0000 | Freq: Every day | NASAL | 2 refills | Status: DC

## 2018-02-10 MED ORDER — CETIRIZINE HCL 10 MG PO TABS: 10.0000 mg | ORAL_TABLET | Freq: Every day | ORAL | 0 refills | Status: DC

## 2018-02-10 NOTE — Discharge Instructions (Addendum)
We will give you a Toradol injection here to help with your headache I believe that your symptoms are most likely related to sinuses. I would like for you to try some Flonase nasal spray and Zyrtec for your symptoms [facility]

## 2018-02-10 NOTE — ED Triage Notes (Signed)
Pt here for cough and throat irritation; pt sts keeps her awake

## 2018-02-10 NOTE — ED Provider Notes (Signed)
[facility]    CSN: [id number] Arrival date & time: 02/10/18  0941     History   Chief Complaint Chief Complaint  Patient presents with  . Cough    HPI Autumn Green is a 57 y.o. female.   Patient is a 57 year old female who presents with 3 to 4 days of postnasal drip, cough, sinus headache.  Her symptoms have been constant and remain the same.  The headache is worse today.  It is located to the frontal region.  She denies any associated nausea, vomiting, photophobia, phonophobia.  Denies any dizziness or blurred vision.  Mild sore throat and ear pain.  She has been taking over-the-counter Tussionex for symptoms without much relief.  Denies any fever, chills, body aches, night sweats.  ROS per HPI      Past Medical History:  Diagnosis Date  . High cholesterol   . Hyperlipidemia 04/17/2016  . Hypertension    about 10 years, well controlled with meds  . Hypothyroidism    about 5 years on oral meds  . Spondylosis of lumbar joint   . Tuberculosis    1983-1985, no recurrance    Patient Active Problem List   Diagnosis Date Noted  . Left sided numbness 04/26/2017  . Hypothyroidism   . Acute bronchitis 08/08/2016  . Chest wall pain 08/08/2016  . Hyperlipidemia 04/17/2016  . Hypertension 08/18/2012    Past Surgical History:  Procedure Laterality Date  . ABDOMINAL HYSTERECTOMY     2004  . BACK SURGERY     2009 dicsectomy  . BREAST SURGERY     had 5 or 6 cyst removed from breast, starting 1979  . LEFT HEART CATHETERIZATION WITH CORONARY ANGIOGRAM N/A 10/14/2012   Procedure: LEFT HEART CATHETERIZATION WITH CORONARY ANGIOGRAM;  Surgeon: Pamella Pert, MD;  Location: [facility] CATH LAB;  Service: Cardiovascular;  Laterality: N/A;  . LUMBAR DISC SURGERY  07/17/2011   decompression    OB History   None      Home Medications    Prior to Admission medications   Medication Sig Start Date End Date Taking? Authorizing Provider  albuterol (PROVENTIL HFA;VENTOLIN  HFA) 108 (90 Base) MCG/ACT inhaler Inhale 2 puffs into the lungs every 4 (four) hours as needed for wheezing or shortness of breath. 08/08/16   Defelice, Para March, NP  aspirin (BAYER ASPIRIN) 325 MG tablet Take 1 tablet (325 mg total) by mouth daily. 04/28/17 04/28/18  Alison Murray, MD  cetirizine (ZYRTEC) 10 MG tablet Take 1 tablet (10 mg total) by mouth daily. 02/10/18   Dahlia Byes A, NP  cycloSPORINE (RESTASIS) 0.05 % ophthalmic emulsion Place 1 drop into both eyes 2 (two) times daily.    [provider]  diltiazem (DILACOR XR) 180 MG 24 hr capsule Take 180 mg by mouth daily.    [provider]  ferrous sulfate 325 (65 FE) MG tablet Take 325 mg by mouth 3 (three) times a week.    [provider]  fluticasone (FLONASE) 50 MCG/ACT nasal spray Place 1 spray into both nostrils daily. 02/10/18   Dahlia Byes A, NP  levothyroxine (SYNTHROID, LEVOTHROID) 75 MCG tablet Take 75 mcg by mouth daily before breakfast.    [provider]  omeprazole (PRILOSEC) 20 MG capsule Take 20 mg by mouth daily.    [provider]    Family History Family History  Problem Relation Age of Onset  . Colon cancer Mother   . Heart attack Father   .  Bone cancer Brother   . Thyroid disease Maternal Grandmother   . Thyroid disease Maternal Aunt   . Thyroid disease Maternal Uncle   . Heart attack Brother   . Stroke Brother   . Hypotension Neg Hx   . Malignant hyperthermia Neg Hx   . Pseudochol deficiency Neg Hx     Social History Social History   Tobacco Use  . Smoking status: Never Smoker  . Smokeless tobacco: Never Used  Substance Use Topics  . Alcohol use: No  . Drug use: No     Allergies   Gabapentin   Review of Systems Review of Systems   Physical Exam Triage Vital Signs ED Triage Vitals [02/10/18 1027]  Enc Vitals Group     BP (!) 150/103     Pulse Rate (!) 57     Resp 18     Temp 97.6 F (36.4 C)     Temp Source Oral     SpO2 100 %     Weight       Height      Head Circumference      Peak Flow      Pain Score 8     Pain Loc      Pain Edu?      Excl. in GC?    No data found.  Updated Vital Signs BP (!) 150/103 (BP Location: Right Arm)   Pulse (!) 57   Temp 97.6 F (36.4 C) (Oral)   Resp 18   SpO2 100%   Visual Acuity Right Eye Distance:   Left Eye Distance:   Bilateral Distance:    Right Eye Near:   Left Eye Near:    Bilateral Near:     Physical Exam  Constitutional: She appears well-developed and well-nourished.  HENT:  Head: Normocephalic and atraumatic.  Right Ear: Hearing, external ear and ear canal normal. A middle ear effusion is present.  Left Ear: Hearing, external ear and ear canal normal. Tympanic membrane is retracted.  Nose: Mucosal edema present. Right sinus exhibits frontal sinus tenderness. Left sinus exhibits frontal sinus tenderness.  Mouth/Throat: Uvula is midline and mucous membranes are normal. Posterior oropharyngeal erythema present. Tonsils are 1+ on the right. Tonsils are 1+ on the left.  Eyes: Conjunctivae are normal.  Neck: Normal range of motion.  Cardiovascular: Normal rate, regular rhythm and normal heart sounds.  Pulmonary/Chest: Effort normal and breath sounds normal.  Musculoskeletal: Normal range of motion.  Neurological: She is alert.  No focal neuro deficits  Skin: Skin is warm and dry.  Psychiatric: She has a normal mood and affect.  Nursing note and vitals reviewed.    UC Treatments / Results  Labs (all labs ordered are listed, but only abnormal results are displayed) Labs Reviewed - No data to display  EKG None  Radiology No results found.  Procedures Procedures (including critical care time)  Medications Ordered in UC Medications  ketorolac (TORADOL) 30 MG/ML injection 30 mg (30 mg Intramuscular Given 02/10/18 1104)    Initial Impression / Assessment and Plan / UC Course  I have reviewed the triage vital signs and the nursing notes.  Pertinent labs  & imaging results that were available during my care of the patient were reviewed by me and considered in my medical decision making (see chart for details).     Symptoms consistent with sinus headache. Will treat with Toradol injection in clinic. Prescription sent to pharmacy for Flonase, Zyrtec to further help with symptoms. Follow  up as needed for continued or worsening symptoms  Final Clinical Impressions(s) / UC Diagnoses   Final diagnoses:  Sinus headache     Discharge Instructions     We will give you a Toradol injection here to help with your headache I believe that your symptoms are most likely related to sinuses. I would like for you to try some Flonase nasal spray and Zyrtec for your symptoms [facility]    ED Prescriptions    Medication Sig Dispense Auth. Provider   fluticasone (FLONASE) 50 MCG/ACT nasal spray Place 1 spray into both nostrils daily. 16 g Adem Costlow A, NP   cetirizine (ZYRTEC) 10 MG tablet Take 1 tablet (10 mg total) by mouth daily. 30 tablet Dahlia Byes A, NP     Controlled Substance Prescriptions [place] Controlled Substance Registry consulted? Not Applicable   Janace Aris, NP 02/10/18 1113

## 2018-03-16 ENCOUNTER — Ambulatory Visit (HOSPITAL_COMMUNITY)
Admission: EM | Admit: 2018-03-16 | Discharge: 2018-03-16 | Disposition: A | Discharge status: Home / Self Care | Payer: Federal, State, Local not specified - PPO | Source: Ambulatory Visit

## 2018-03-16 DIAGNOSIS — T283XXA Burn of internal genitourinary organs, initial encounter: Secondary | ICD-10-CM | POA: Diagnosis not present

## 2018-03-16 DIAGNOSIS — T310 Burns involving less than 10% of body surface: Secondary | ICD-10-CM | POA: Diagnosis not present

## 2018-03-16 DIAGNOSIS — Z7982 Long term (current) use of aspirin: Secondary | ICD-10-CM | POA: Diagnosis not present

## 2018-03-16 DIAGNOSIS — Z79899 Other long term (current) drug therapy: Secondary | ICD-10-CM | POA: Diagnosis not present

## 2018-03-16 DIAGNOSIS — T24111A Burn of first degree of right thigh, initial encounter: Secondary | ICD-10-CM | POA: Diagnosis not present

## 2018-03-16 DIAGNOSIS — T24112A Burn of first degree of left thigh, initial encounter: Secondary | ICD-10-CM | POA: Diagnosis not present

## 2018-03-16 DIAGNOSIS — I10 Essential (primary) hypertension: Secondary | ICD-10-CM | POA: Diagnosis not present

## 2018-03-16 DIAGNOSIS — X100XXA Contact with hot drinks, initial encounter: Secondary | ICD-10-CM | POA: Diagnosis not present

## 2018-05-19 ENCOUNTER — Encounter: Payer: Self-pay | Admitting: Nurse Practitioner

## 2018-05-19 ENCOUNTER — Ambulatory Visit (INDEPENDENT_AMBULATORY_CARE_PROVIDER_SITE_OTHER): Payer: Federal, State, Local not specified - PPO | Admitting: Nurse Practitioner

## 2018-05-19 ENCOUNTER — Other Ambulatory Visit: Payer: Self-pay

## 2018-05-19 VITALS — BP 116/74 | HR 68 | Temp 97.9°F | Ht 63.0 in | Wt 170.8 lb

## 2018-05-19 DIAGNOSIS — I10 Essential (primary) hypertension: Secondary | ICD-10-CM

## 2018-05-19 DIAGNOSIS — J0101 Acute recurrent maxillary sinusitis: Secondary | ICD-10-CM

## 2018-05-19 DIAGNOSIS — E782 Mixed hyperlipidemia: Secondary | ICD-10-CM

## 2018-05-19 DIAGNOSIS — E039 Hypothyroidism, unspecified: Secondary | ICD-10-CM | POA: Diagnosis not present

## 2018-05-19 MED ORDER — AZELASTINE HCL 0.1 % NA SOLN: 2.0000 | Freq: Two times a day (BID) | NASAL | 2 refills | Status: DC

## 2018-05-19 MED ORDER — AMOXICILLIN-POT CLAVULANATE 875-125 MG PO TABS: 1.0000 | ORAL_TABLET | Freq: Two times a day (BID) | ORAL | 0 refills | Status: AC

## 2018-05-19 NOTE — Patient Instructions (Signed)

## 2018-05-19 NOTE — Progress Notes (Signed)
Subjective:     Patient ID: Autumn Green , female    DOB: 11-Sep-1960 , 58 y.o.   MRN: [id number]   Chief Complaint  Patient presents with  . Hypertension    congestion, coughing, no fever, runny nose , some sore throat     HPI  She is here today with nasal congestion.  Seen at [facility] about 1 month ago, given nasal flonase and amoxicillin.  She had some relief however she continues to have congestion nasal, runny eyes for the last week.  She took an [employer] cold plus last night.  No fever.    Hypertension  This is a chronic (had been managed at [facility] for the last 1 1/2 years) problem. The current episode started more than 1 year ago. The problem is unchanged. The problem is controlled. Pertinent negatives include no anxiety, chest pain, headaches, palpitations or shortness of breath. There are no associated agents to hypertension. There are no known risk factors for coronary artery disease. Past treatments include angiotensin blockers. The current treatment provides no improvement. There are no compliance problems.  There is no history of angina or kidney disease. There is no history of chronic renal disease.     Past Medical History:  Diagnosis Date  . High cholesterol   . Hyperlipidemia 04/17/2016  . Hypertension    about 10 years, well controlled with meds  . Hypothyroidism    about 5 years on oral meds  . Spondylosis of lumbar joint   . Tuberculosis    1983-1985, no recurrance     Family History  Problem Relation Age of Onset  . Colon cancer Mother   . Heart attack Father   . Bone cancer Brother   . Thyroid disease Maternal Grandmother   . Thyroid disease Maternal Aunt   . Thyroid disease Maternal Uncle   . Heart attack Brother   . Stroke Brother   . Hypotension Neg Hx   . Malignant hyperthermia Neg Hx   . Pseudochol deficiency Neg Hx      Current Outpatient Medications:  .  cetirizine (ZYRTEC) 10 MG tablet, Take 1 tablet (10 mg total) by mouth daily.,  Disp: 30 tablet, Rfl: 0 .  cycloSPORINE (RESTASIS) 0.05 % ophthalmic emulsion, Place 1 drop into both eyes 2 (two) times daily., Disp: , Rfl:  .  diltiazem (DILACOR XR) 180 MG 24 hr capsule, Take 180 mg by mouth daily., Disp: , Rfl:  .  ferrous sulfate 325 (65 FE) MG tablet, Take 325 mg by mouth 3 (three) times a week., Disp: , Rfl:  .  levothyroxine (SYNTHROID, LEVOTHROID) 75 MCG tablet, Take 75 mcg by mouth daily before breakfast., Disp: , Rfl:  .  lisinopril-hydrochlorothiazide (PRINZIDE,ZESTORETIC) 20-25 MG tablet, , Disp: , Rfl:  .  Multiple Vitamin (THERA) TABS, Take by mouth., Disp: , Rfl:  .  omeprazole (PRILOSEC) 20 MG capsule, Take 20 mg by mouth daily., Disp: , Rfl:  .  fluticasone (FLONASE) 50 MCG/ACT nasal spray, Place 1 spray into both nostrils daily. (Patient not taking: Reported on 05/19/2018), Disp: 16 g, Rfl: 2   Allergies  Allergen Reactions  . Gabapentin Swelling     Review of Systems  Constitutional: Negative.  Negative for fatigue.  Respiratory: Positive for cough. Negative for shortness of breath and wheezing.   Cardiovascular: Negative for chest pain, palpitations and leg swelling.  Endocrine: Negative for polydipsia, polyphagia and polyuria.  Neurological: Negative for dizziness and headaches.  Today's Vitals   05/19/18 0839  BP: 116/74  Pulse: 68  Temp: 97.9 F (36.6 C)  TempSrc: Oral  SpO2: 97%  Weight: 170 lb 12.8 oz (77.5 kg)  Height: _0  (1.6 m)   Body mass index is 30.26 kg/m.   Objective:  Physical Exam Vitals signs reviewed.  Constitutional:      Appearance: Normal appearance.  HENT:     Mouth/Throat:     Mouth: Mucous membranes are moist.  Eyes:     General:        Right eye: No discharge.        Left eye: No discharge.     Extraocular Movements: Extraocular movements intact.     Conjunctiva/sclera: Conjunctivae normal.     Pupils: Pupils are equal, round, and reactive to light.     Comments: Bilateral eyelids are puffy   Cardiovascular:     Rate and Rhythm: Normal rate and regular rhythm.     Pulses: Normal pulses.     Heart sounds: Normal heart sounds. No murmur.  Pulmonary:     Effort: Pulmonary effort is normal. No respiratory distress.     Breath sounds: Normal breath sounds. No wheezing.  Neurological:     Mental Status: She is alert.         Assessment And Plan:     1. Acute recurrent maxillary sinusitis  Puffy eyelids bilaterally  She has been treated with amoxicillin with mild benefit. - amoxicillin-clavulanate (AUGMENTIN) 875-125 MG tablet; Take 1 tablet by mouth 2 (two) times daily for 7 days.  Dispense: 14 tablet; Refill: 0 - azelastine (ASTELIN) 0.1 % nasal spray; Place 2 sprays into both nostrils 2 (two) times daily. Use in each nostril as directed  Dispense: 30 mL; Refill: 2  2. Essential hypertension . B/P is controlled, she has been managed at the [place] . BMP ordered to check renal function.  . The importance of regular exercise and dietary modification was stressed to the patient.  - BMP8+eGFR  3. Acquired hypothyroidism  Chronic, controlled  Continue with current medications - TSH - T3 - T4, Free  4. Mixed hyperlipidemia  Chronic, will check lipids  No current medications at this time   Minette Brine, FNP

## 2018-05-20 LAB — BMP8+EGFR
BUN/Creatinine Ratio: 15 (ref 12–28)
BUN: 12 mg/dL (ref 8–27)
CO2: 24 mmol/L (ref 20–29)
Calcium: 9.8 mg/dL (ref 8.7–10.3)
Chloride: 104 mmol/L (ref 96–106)
Creatinine, Ser: 0.78 mg/dL (ref 0.57–1.00)
GFR calc non Af Amer: 83 mL/min/{1.73_m2} (ref >59)
GFR, EST AFRICAN AMERICAN: 96 mL/min/{1.73_m2} (ref >59)
Glucose: 79 mg/dL (ref 65–99)
POTASSIUM: 4.5 mmol/L (ref 3.5–5.2)
SODIUM: 144 mmol/L (ref 134–144)

## 2018-05-20 LAB — T4, FREE: Free T4: 1.19 ng/dL (ref 0.82–1.77)

## 2018-05-20 LAB — TSH: TSH: 2.79 u[IU]/mL (ref 0.450–4.500)

## 2018-05-20 LAB — T3: T3 TOTAL: 90 ng/dL (ref 71–180)

## 2018-07-22 ENCOUNTER — Encounter: Payer: Federal, State, Local not specified - PPO | Admitting: Internal Medicine

## 2018-08-05 ENCOUNTER — Other Ambulatory Visit: Payer: Self-pay

## 2018-08-05 ENCOUNTER — Encounter: Payer: Self-pay | Admitting: Nurse Practitioner

## 2018-08-05 ENCOUNTER — Ambulatory Visit (INDEPENDENT_AMBULATORY_CARE_PROVIDER_SITE_OTHER): Payer: Federal, State, Local not specified - PPO | Admitting: Nurse Practitioner

## 2018-08-05 VITALS — BP 142/84 | HR 67 | Temp 98.2°F | Ht 62.6 in | Wt 176.0 lb

## 2018-08-05 DIAGNOSIS — R5383 Other fatigue: Secondary | ICD-10-CM

## 2018-08-05 DIAGNOSIS — I10 Essential (primary) hypertension: Secondary | ICD-10-CM

## 2018-08-05 DIAGNOSIS — Z0000 Encounter for general adult medical examination without abnormal findings: Secondary | ICD-10-CM | POA: Diagnosis not present

## 2018-08-05 DIAGNOSIS — M542 Cervicalgia: Secondary | ICD-10-CM | POA: Insufficient documentation

## 2018-08-05 DIAGNOSIS — Z23 Encounter for immunization: Secondary | ICD-10-CM | POA: Diagnosis not present

## 2018-08-05 DIAGNOSIS — E559 Vitamin D deficiency, unspecified: Secondary | ICD-10-CM

## 2018-08-05 DIAGNOSIS — Z1159 Encounter for screening for other viral diseases: Secondary | ICD-10-CM | POA: Diagnosis not present

## 2018-08-05 LAB — POCT URINALYSIS DIPSTICK
Bilirubin, UA: NEGATIVE
Glucose, UA: NEGATIVE
Ketones, UA: NEGATIVE
Leukocytes, UA: NEGATIVE
Nitrite, UA: NEGATIVE
Protein, UA: NEGATIVE
Spec Grav, UA: 1.025 (ref 1.010–1.025)
Urobilinogen, UA: 0.2 E.U./dL
pH, UA: 5.5 (ref 5.0–8.0)

## 2018-08-05 LAB — POCT UA - MICROALBUMIN
Creatinine, POC: 300 mg/dL
Microalbumin Ur, POC: 80 mg/L

## 2018-08-05 MED ORDER — CYCLOBENZAPRINE HCL 10 MG PO TABS: 10.0000 mg | ORAL_TABLET | Freq: Three times a day (TID) | ORAL | 0 refills | Status: DC | PRN

## 2018-08-05 MED ORDER — TETANUS-DIPHTH-ACELL PERTUSSIS 5-2.5-18.5 LF-MCG/0.5 IM SUSP
0.5000 mL | Freq: Once | INTRAMUSCULAR | Status: AC
Start: 2018-08-05 — End: 2018-08-05
  Administered 2018-08-05: 10:00:00 0.5 mL via INTRAMUSCULAR

## 2018-08-05 NOTE — Patient Instructions (Addendum)
Health Maintenance  Topic Date Due  . PAP SMEAR-Modifier  03/24/1979  . MAMMOGRAM  03/23/2008  . COLONOSCOPY  03/23/2008  . INFLUENZA VACCINE  10/11/2018  . TETANUS/TDAP  08/04/2028  . Hepatitis C Screening  Completed  . HIV Screening  Completed   Health Maintenance, Female Adopting a healthy lifestyle and getting preventive care can go a long way to promote health and wellness. Talk with your health care provider about what schedule of regular examinations is right for you. This is a good chance for you to check in with your provider about disease prevention and staying healthy. In between checkups, there are plenty of things you can do on your own. Experts have done a lot of research about which lifestyle changes and preventive measures are most likely to keep you healthy. Ask your health care provider for more information. Weight and diet Eat a healthy diet  Be sure to include plenty of vegetables, fruits, low-fat dairy products, and lean protein.  Do not eat a lot of foods high in solid fats, added sugars, or salt.  Get regular exercise. This is one of the most important things you can do for your health. ? Most adults should exercise for at least 150 minutes each week. The exercise should increase your heart rate and make you sweat (moderate-intensity exercise). ? Most adults should also do strengthening exercises at least twice a week. This is in addition to the moderate-intensity exercise. Maintain a healthy weight  Body mass index (BMI) is a measurement that can be used to identify possible weight problems. It estimates body fat based on height and weight. Your health care provider can help determine your BMI and help you achieve or maintain a healthy weight.  For females 60 years of age and older: ? A BMI below 18.5 is considered underweight. ? A BMI of 18.5 to 24.9 is normal. ? A BMI of 25 to 29.9 is considered overweight. ? A BMI of 30 and above is considered obese. Watch  levels of cholesterol and blood lipids  You should start having your blood tested for lipids and cholesterol at 60 years of age, then have this test every 5 years.  You may need to have your cholesterol levels checked more often if: ? Your lipid or cholesterol levels are high. ? You are older than 60 years of age. ? You are at high risk for heart disease. Cancer screening Lung Cancer  Lung cancer screening is recommended for adults 12-60 years old who are at high risk for lung cancer because of a history of smoking.  A yearly low-dose CT scan of the lungs is recommended for people who: ? Currently smoke. ? Have quit within the past 15 years. ? Have at least a 30-pack-year history of smoking. A pack year is smoking an average of one pack of cigarettes a day for 1 year.  Yearly screening should continue until it has been 15 years since you quit.  Yearly screening should stop if you develop a health problem that would prevent you from having lung cancer treatment. Breast Cancer  Practice breast self-awareness. This means understanding how your breasts normally appear and feel.  It also means doing regular breast self-exams. Let your health care provider know about any changes, no matter how small.  If you are in your 60s or 30s, you should have a clinical breast exam (CBE) by a health care provider every 1-3 years as part of a regular health exam.  If you  are 60 or older, have a CBE every year. Also consider having a breast X-ray (mammogram) every year.  If you have a family history of breast cancer, talk to your health care provider about genetic screening.  If you are at high risk for breast cancer, talk to your health care provider about having an MRI and a mammogram every year.  Breast cancer gene (BRCA) assessment is recommended for women who have family members with BRCA-related cancers. BRCA-related cancers include: ? Breast. ? Ovarian. ? Tubal. ? Peritoneal  cancers.  Results of the assessment will determine the need for genetic counseling and BRCA1 and BRCA2 testing. Cervical Cancer Your health care provider may recommend that you be screened regularly for cancer of the pelvic organs (ovaries, uterus, and vagina). This screening involves a pelvic examination, including checking for microscopic changes to the surface of your cervix (Pap test). You may be encouraged to have this screening done every 3 years, beginning at age 60.  For women ages 60-65, health care providers may recommend pelvic exams and Pap testing every 3 years, or they may recommend the Pap and pelvic exam, combined with testing for human papilloma virus (HPV), every 5 years. Some types of HPV increase your risk of cervical cancer. Testing for HPV may also be done on women of any age with unclear Pap test results.  Other health care providers may not recommend any screening for nonpregnant women who are considered low risk for pelvic cancer and who do not have symptoms. Ask your health care provider if a screening pelvic exam is right for you.  If you have had past treatment for cervical cancer or a condition that could lead to cancer, you need Pap tests and screening for cancer for at least 20 years after your treatment. If Pap tests have been discontinued, your risk factors (such as having a new sexual partner) need to be reassessed to determine if screening should resume. Some women have medical problems that increase the chance of getting cervical cancer. In these cases, your health care provider may recommend more frequent screening and Pap tests. Colorectal Cancer  This type of cancer can be detected and often prevented.  Routine colorectal cancer screening usually begins at 60 years of age and continues through 60 years of age.  Your health care provider may recommend screening at an earlier age if you have risk factors for colon cancer.  Your health care provider may also  recommend using home test kits to check for hidden blood in the stool.  A small camera at the end of a tube can be used to examine your colon directly (sigmoidoscopy or colonoscopy). This is done to check for the earliest forms of colorectal cancer.  Routine screening usually begins at age 37.  Direct examination of the colon should be repeated every 5-10 years through 60 years of age. However, you may need to be screened more often if early forms of precancerous polyps or small growths are found. Skin Cancer  Check your skin from head to toe regularly.  Tell your health care provider about any new moles or changes in moles, especially if there is a change in a mole's shape or color.  Also tell your health care provider if you have a mole that is larger than the size of a pencil eraser.  Always use sunscreen. Apply sunscreen liberally and repeatedly throughout the day.  Protect yourself by wearing long sleeves, pants, a wide-brimmed hat, and sunglasses whenever you are outside.  Heart disease, diabetes, and high blood pressure  High blood pressure causes heart disease and increases the risk of stroke. High blood pressure is more likely to develop in: ? People who have blood pressure in the high end of the normal range (130-139/85-89 mm Hg). ? People who are overweight or obese. ? People who are African American.  If you are 53-43 years of age, have your blood pressure checked every 3-5 years. If you are 62 years of age or older, have your blood pressure checked every year. You should have your blood pressure measured twice-once when you are at a hospital or clinic, and once when you are not at a hospital or clinic. Record the average of the two measurements. To check your blood pressure when you are not at a hospital or clinic, you can use: ? An automated blood pressure machine at a pharmacy. ? A home blood pressure monitor.  If you are between 38 years and 33 years old, ask your health  care provider if you should take aspirin to prevent strokes.  Have regular diabetes screenings. This involves taking a blood sample to check your fasting blood sugar level. ? If you are at a normal weight and have a low risk for diabetes, have this test once every three years after 60 years of age. ? If you are overweight and have a high risk for diabetes, consider being tested at a younger age or more often. Preventing infection Hepatitis B  If you have a higher risk for hepatitis B, you should be screened for this virus. You are considered at high risk for hepatitis B if: ? You were born in a country where hepatitis B is common. Ask your health care provider which countries are considered high risk. ? Your parents were born in a high-risk country, and you have not been immunized against hepatitis B (hepatitis B vaccine). ? You have HIV or AIDS. ? You use needles to inject street drugs. ? You live with someone who has hepatitis B. ? You have had sex with someone who has hepatitis B. ? You get hemodialysis treatment. ? You take certain medicines for conditions, including cancer, organ transplantation, and autoimmune conditions. Hepatitis C  Blood testing is recommended for: ? Everyone born from [birth year] through 1967. ? Anyone with known risk factors for hepatitis C. Sexually transmitted infections (STIs)  You should be screened for sexually transmitted infections (STIs) including gonorrhea and chlamydia if: ? You are sexually active and are younger than 60 years of age. ? You are older than 60 years of age and your health care provider tells you that you are at risk for this type of infection. ? Your sexual activity has changed since you were last screened and you are at an increased risk for chlamydia or gonorrhea. Ask your health care provider if you are at risk.  If you do not have HIV, but are at risk, it may be recommended that you take a prescription medicine daily to prevent HIV  infection. This is called pre-exposure prophylaxis (PrEP). You are considered at risk if: ? You are sexually active and do not regularly use condoms or know the HIV status of your partner(s). ? You take drugs by injection. ? You are sexually active with a partner who has HIV. Talk with your health care provider about whether you are at high risk of being infected with HIV. If you choose to begin PrEP, you should first be tested for HIV. You should then  be tested every 3 months for as long as you are taking PrEP. Pregnancy  If you are premenopausal and you may become pregnant, ask your health care provider about preconception counseling.  If you may become pregnant, take 400 to 800 micrograms (mcg) of folic acid every day.  If you want to prevent pregnancy, talk to your health care provider about birth control (contraception). Osteoporosis and menopause  Osteoporosis is a disease in which the bones lose minerals and strength with aging. This can result in serious bone fractures. Your risk for osteoporosis can be identified using a bone density scan.  If you are 10 years of age or older, or if you are at risk for osteoporosis and fractures, ask your health care provider if you should be screened.  Ask your health care provider whether you should take a calcium or vitamin D supplement to lower your risk for osteoporosis.  Menopause may have certain physical symptoms and risks.  Hormone replacement therapy may reduce some of these symptoms and risks. Talk to your health care provider about whether hormone replacement therapy is right for you. Follow these instructions at home:  Schedule regular health, dental, and eye exams.  Stay current with your immunizations.  Do not use any tobacco products including cigarettes, chewing tobacco, or electronic cigarettes.  If you are pregnant, do not drink alcohol.  If you are breastfeeding, limit how much and how often you drink alcohol.  Limit  alcohol intake to no more than 1 drink per day for nonpregnant women. One drink equals 12 ounces of beer, 5 ounces of wine, or 1 ounces of hard liquor.  Do not use street drugs.  Do not share needles.  Ask your health care provider for help if you need support or information about quitting drugs.  Tell your health care provider if you often feel depressed.  Tell your health care provider if you have ever been abused or do not feel safe at home. This information is not intended to replace advice given to you by your health care provider. Make sure you discuss any questions you have with your health care provider. Document Released: 09/11/2010 Document Revised: 08/04/2015 Document Reviewed: 11/30/2014 Elsevier Interactive Patient Education  2019 [employer].

## 2018-08-05 NOTE — Progress Notes (Signed)
Subjective:     Patient ID: Autumn Green , female    DOB: Apr 03, 1960 , 58 y.o.   MRN: [id number]   Chief Complaint  Patient presents with  . Annual Exam   The patient states she uses post menopausal status for birth control. Last LMP was No LMP recorded. Patient has had a hysterectomy.. Negative for Dysmenorrhea and Negative for Menorrhagia Mammogram last done at [facility] - will get results reports normal) Negative for: breast discharge, breast lump(s), breast pain and breast self exam.  Pertinent negatives include abnormal bleeding (hematology), anxiety, decreased libido, depression, difficulty falling sleep, dyspareunia, history of infertility, nocturia, sexual dysfunction, sleep disturbances, urinary incontinence, urinary urgency, vaginal discharge and vaginal itching. Diet regular.The patient states her exercise level is  at work at the [employer]     The patient's tobacco use is:  Social History   Tobacco Use  Smoking Status Never Smoker  Smokeless Tobacco Never Used  . She has been exposed to passive smoke. The patient's alcohol use is:  Social History   Substance and Sexual Activity  Alcohol Use No   Additional information: Last pap done at [place] - 2019, next one scheduled for 2022.   HPI  Here for HM  Hypertension  This is a chronic (had been managed at [facility] for the last 1 1/2 years) problem. The current episode started more than 1 year ago. The problem is unchanged. The problem is controlled. Associated symptoms include neck pain. Pertinent negatives include no anxiety, chest pain, headaches, palpitations or shortness of breath. There are no associated agents to hypertension. There are no known risk factors for coronary artery disease. Past treatments include angiotensin blockers. The current treatment provides no improvement. There are no compliance problems.  There is no history of angina or kidney disease. Identifiable causes of hypertension include a thyroid problem. There is no  history of chronic renal disease.  Thyroid Problem  Patient reports no palpitations.     Past Medical History:  Diagnosis Date  . High cholesterol   . Hyperlipidemia 04/17/2016  . Hypertension    about 10 years, well controlled with meds  . Hypothyroidism    about 5 years on oral meds  . Spondylosis of lumbar joint   . Tuberculosis    1983-1985, no recurrance     Family History  Problem Relation Age of Onset  . Colon cancer Mother   . Heart attack Father   . Bone cancer Brother   . Thyroid disease Maternal Grandmother   . Thyroid disease Maternal Aunt   . Thyroid disease Maternal Uncle   . Heart attack Brother   . Stroke Brother   . Hypotension Neg Hx   . Malignant hyperthermia Neg Hx   . Pseudochol deficiency Neg Hx      Current Outpatient Medications:  .  azelastine (ASTELIN) 0.1 % nasal spray, Place 2 sprays into both nostrils 2 (two) times daily. Use in each nostril as directed, Disp: 30 mL, Rfl: 2 .  cycloSPORINE (RESTASIS) 0.05 % ophthalmic emulsion, Place 1 drop into both eyes 2 (two) times daily., Disp: , Rfl:  .  diltiazem (DILACOR XR) 180 MG 24 hr capsule, Take 180 mg by mouth daily., Disp: , Rfl:  .  ferrous sulfate 325 (65 FE) MG tablet, Take 325 mg by mouth 3 (three) times a week., Disp: , Rfl:  .  hydrochlorothiazide (HYDRODIURIL) 25 MG tablet, Take 25 mg by mouth daily., Disp: , Rfl:  .  losartan (COZAAR)  100 MG tablet, Take 100 mg by mouth daily. TAKE 1/2 A PILL EVERY OTHER DAY, Disp: , Rfl:  .  Multiple Vitamin (THERA) TABS, Take by mouth., Disp: , Rfl:  .  omeprazole (PRILOSEC) 20 MG capsule, Take 20 mg by mouth daily., Disp: , Rfl:  .  cyclobenzaprine (FLEXERIL) 10 MG tablet, Take 1 tablet (10 mg total) by mouth 3 (three) times daily as needed for muscle spasms., Disp: 20 tablet, Rfl: 0 .  levothyroxine (SYNTHROID, LEVOTHROID) 75 MCG tablet, Take 75 mcg by mouth daily before breakfast., Disp: , Rfl:    Allergies  Allergen Reactions  . Gabapentin  Swelling     Review of Systems  Constitutional: Negative.   HENT: Negative.   Eyes: Negative.   Respiratory: Negative.  Negative for shortness of breath.   Cardiovascular: Negative.  Negative for chest pain and palpitations.  Gastrointestinal: Negative.   Endocrine: Negative.   Genitourinary: Negative.   Musculoskeletal: Positive for neck pain.  Skin: Negative.   Allergic/Immunologic: Negative.   Neurological: Negative.  Negative for dizziness and headaches.  Hematological: Negative.   Psychiatric/Behavioral: Negative.      Today's Vitals   08/05/18 0909  BP: (!) 142/84  Pulse: 67  Temp: 98.2 F (36.8 C)  TempSrc: Oral  Weight: 176 lb (79.8 kg)  Height: 5' 2.6" (1.59 m)  PainSc: 6   PainLoc: Neck   Body mass index is 31.58 kg/m.   Objective:  Physical Exam Vitals signs reviewed.  Constitutional:      Appearance: Normal appearance. She is well-developed.  HENT:     Head: Normocephalic and atraumatic.     Right Ear: Hearing, tympanic membrane, ear canal and external ear normal.     Left Ear: Hearing, tympanic membrane, ear canal and external ear normal.  Eyes:     General: Lids are normal.     Extraocular Movements: Extraocular movements intact.     Conjunctiva/sclera: Conjunctivae normal.     Pupils: Pupils are equal, round, and reactive to light.     Funduscopic exam:    Right eye: No papilledema.        Left eye: No papilledema.  Neck:     Musculoskeletal: Full passive range of motion without pain, normal range of motion and neck supple.     Thyroid: No thyroid mass.     Vascular: No carotid bruit.  Cardiovascular:     Rate and Rhythm: Normal rate and regular rhythm.     Pulses: Normal pulses.     Heart sounds: Normal heart sounds. No murmur.  Pulmonary:     Effort: Pulmonary effort is normal.     Breath sounds: Normal breath sounds.  Abdominal:     General: Abdomen is flat. Bowel sounds are normal.     Palpations: Abdomen is soft.  Musculoskeletal:  Normal range of motion.        General: No swelling.     Right lower leg: No edema.     Left lower leg: No edema.  Skin:    General: Skin is warm and dry.     Capillary Refill: Capillary refill takes less than 2 seconds.  Neurological:     General: No focal deficit present.     Mental Status: She is alert and oriented to person, place, and time.     Cranial Nerves: No cranial nerve deficit.     Sensory: No sensory deficit.  Psychiatric:        Mood and Affect: Mood normal.  Behavior: Behavior normal.        Thought Content: Thought content normal.        Judgment: Judgment normal.         Assessment And Plan:     1. Health maintenance examination . Behavior modifications discussed and diet history reviewed.   . Pt will continue to exercise regularly and modify diet with low GI, plant based foods and decrease intake of processed foods.  . Recommend intake of daily multivitamin, Vitamin D, and calcium.  . Recommend for preventive screenings - Lipid Profile  2. Encounter for hepatitis C screening test for low risk patient  Will check for Hepatitis C screening due to being born between the years 1945-1965 - Hepatitis C antibody  3. Encounter for immunization  Will give tetanus vaccine today while in office. Refer to order management. TDAP will be administered to adults 8518-60 years old every 10 years. - Tdap (BOOSTRIX) injection 0.5 mL  4. Essential hypertension . B/P slightly elevated . chronic  . The importance of regular exercise and dietary modification was stressed to the patient.  . Stressed importance of losing ten percent of her body weight to help with B/P control.  . The weight loss would help with decreasing cardiac and cancer risk as well.  - POCT Urinalysis Dipstick (81002) - POCT UA - Microalbumin - EKG 12-Lead  5. Vitamin D deficiency  Will check vitamin D level and supplement as needed.     Also encouraged to spend 15 minutes in the sun  daily.  Will continue vitamin d pending labs  - Vitamin D (25 hydroxy)  6. Cervicalgia  Tension to her cervical area, will treat with cyclobenzaprine - cyclobenzaprine (FLEXERIL) 10 MG tablet; Take 1 tablet (10 mg total) by mouth 3 (three) times daily as needed for muscle spasms.  Dispense: 20 tablet; Refill: 0  7. Fatigue, unspecified type  She recently had metabolic panel checked and was normal  Encouraged to make sure drinking adequate amounts of water and exercising     Autumn FeltsJanece Kyel Purk, FNP    THE PATIENT IS ENCOURAGED TO PRACTICE SOCIAL DISTANCING DUE TO THE COVID-19 PANDEMIC.

## 2018-08-06 LAB — LIPID PANEL
Chol/HDL Ratio: 2.9 ratio (ref 0.0–4.4)
Cholesterol, Total: 254 mg/dL — ABNORMAL HIGH (ref 100–199)
HDL: 89 mg/dL (ref >39)
LDL Calculated: 150 mg/dL — ABNORMAL HIGH (ref 0–99)
Triglycerides: 77 mg/dL (ref 0–149)
VLDL Cholesterol Cal: 15 mg/dL (ref 5–40)

## 2018-08-06 LAB — VITAMIN D 25 HYDROXY (VIT D DEFICIENCY, FRACTURES): Vit D, 25-Hydroxy: 24.7 ng/mL — ABNORMAL LOW (ref 30.0–100.0)

## 2018-08-06 LAB — HEPATITIS C ANTIBODY: Hep C Virus Ab: <0.1 s/co ratio (ref 0.0–0.9)

## 2018-08-20 ENCOUNTER — Telehealth: Payer: Self-pay

## 2018-08-20 NOTE — Telephone Encounter (Signed)
Hepatitis C is negative. Your vitamin d is low at 24.7 take an over the counter vitamin d 3,000 units once a day. Your total cholesterol is 254 goal is less than 199, LDL is 150 goal is less than 99, be sure to avoid fried and fatty foods. Increase your fiber intake. We will recheck this in 3 months to make sure decreasing this increases your risk for heart disease.    Patient returned my call regarding her labs    I RETURNED HER CALL AND LEFT A V/M TO [facility]

## 2018-08-26 ENCOUNTER — Ambulatory Visit (INDEPENDENT_AMBULATORY_CARE_PROVIDER_SITE_OTHER): Payer: Federal, State, Local not specified - PPO | Admitting: Nurse Practitioner

## 2018-08-26 ENCOUNTER — Other Ambulatory Visit: Payer: Self-pay

## 2018-08-26 ENCOUNTER — Encounter: Payer: Self-pay | Admitting: Nurse Practitioner

## 2018-08-26 VITALS — BP 130/80 | HR 67 | Temp 98.6°F | Ht 62.4 in | Wt 176.8 lb

## 2018-08-26 DIAGNOSIS — R51 Headache: Secondary | ICD-10-CM

## 2018-08-26 DIAGNOSIS — L84 Corns and callosities: Secondary | ICD-10-CM

## 2018-08-26 DIAGNOSIS — R519 Headache, unspecified: Secondary | ICD-10-CM

## 2018-08-26 NOTE — Progress Notes (Signed)
Subjective:     Patient ID: Autumn Green , female    DOB: 1960/10/08 , 58 y.o.   MRN: [id number]   Chief Complaint  Patient presents with  . Foot Pain    patient is having some pain on the bottom of her foot.    HPI  Foot Pain This is a new problem. The current episode started more than 1 month ago. The problem occurs constantly. Pertinent negatives include no abdominal pain, chest pain or headaches. Exacerbated by: stands on cement floors.     Past Medical History:  Diagnosis Date  . High cholesterol   . Hyperlipidemia 04/17/2016  . Hypertension    about 10 years, well controlled with meds  . Hypothyroidism    about 5 years on oral meds  . Spondylosis of lumbar joint   . Tuberculosis    1983-1985, no recurrance     Family History  Problem Relation Age of Onset  . Colon cancer Mother   . Heart attack Father   . Bone cancer Brother   . Thyroid disease Maternal Grandmother   . Thyroid disease Maternal Aunt   . Thyroid disease Maternal Uncle   . Heart attack Brother   . Stroke Brother   . Hypotension Neg Hx   . Malignant hyperthermia Neg Hx   . Pseudochol deficiency Neg Hx      Current Outpatient Medications:  .  azelastine (ASTELIN) 0.1 % nasal spray, Place 2 sprays into both nostrils 2 (two) times daily. Use in each nostril as directed, Disp: 30 mL, Rfl: 2 .  cyclobenzaprine (FLEXERIL) 10 MG tablet, Take 1 tablet (10 mg total) by mouth 3 (three) times daily as needed for muscle spasms., Disp: 20 tablet, Rfl: 0 .  cycloSPORINE (RESTASIS) 0.05 % ophthalmic emulsion, Place 1 drop into both eyes 2 (two) times daily., Disp: , Rfl:  .  diltiazem (DILACOR XR) 180 MG 24 hr capsule, Take 180 mg by mouth daily., Disp: , Rfl:  .  ferrous sulfate 325 (65 FE) MG tablet, Take 325 mg by mouth 3 (three) times a week., Disp: , Rfl:  .  hydrochlorothiazide (HYDRODIURIL) 25 MG tablet, Take 25 mg by mouth daily., Disp: , Rfl:  .  levothyroxine (SYNTHROID, LEVOTHROID) 75 MCG tablet,  Take 75 mcg by mouth daily before breakfast., Disp: , Rfl:  .  losartan (COZAAR) 100 MG tablet, Take 100 mg by mouth daily. TAKE 1/2 A PILL EVERY OTHER DAY, Disp: , Rfl:  .  Multiple Vitamin (THERA) TABS, Take by mouth., Disp: , Rfl:  .  omeprazole (PRILOSEC) 20 MG capsule, Take 20 mg by mouth daily., Disp: , Rfl:    Allergies  Allergen Reactions  . Gabapentin Swelling     Review of Systems  Constitutional: Negative.   Respiratory: Negative.   Cardiovascular: Negative.  Negative for chest pain, palpitations and leg swelling.  Gastrointestinal: Negative for abdominal pain.  Neurological: Negative for dizziness and headaches.     Today's Vitals   08/26/18 0902  BP: 130/80  Pulse: 67  Temp: 98.6 F (37 C)  TempSrc: Oral  Weight: 176 lb 12.8 oz (80.2 kg)  Height: 5' 2.4" (1.585 m)  PainSc: 0-No pain   Body mass index is 31.92 kg/m.   Objective:  Physical Exam Constitutional:      Appearance: Normal appearance.  Musculoskeletal:        General: Tenderness (left sole of foot) present.     Comments: Goes to the [facility] generally  Skin:  General: Skin is warm and dry.     Capillary Refill: Capillary refill takes less than 2 seconds.  Neurological:     General: No focal deficit present.     Mental Status: Autumn Green is oriented to person, place, and time.  Psychiatric:        Mood and Affect: Mood normal.        Behavior: Behavior normal.        Thought Content: Thought content normal.        Judgment: Judgment normal.         Assessment And Plan:     1. Foot callus  Right plantar firm callous  Advised to continue to use callous remover to area   Also can use vaseline and soak feet regularly  Autumn Green is going to go the [place] walk in to see if Autumn Green can get in with the Podiatrist sooner  2. Acute nonintractable headache, unspecified headache type  Intermittent headache frontal area, sinus pressure  Advised to take allegra daily to see if improves  Encouraged to stay well  hydrated as well.    [facility] Autumn Green has not been using this regularly  If worsens return call to office   Minette Brine, FNP    THE PATIENT IS ENCOURAGED TO PRACTICE SOCIAL DISTANCING DUE TO THE COVID-19 PANDEMIC.

## 2018-10-15 ENCOUNTER — Ambulatory Visit (HOSPITAL_COMMUNITY)
Admission: EM | Admit: 2018-10-15 | Discharge: 2018-10-15 | Disposition: A | Discharge status: Home / Self Care | Payer: Federal, State, Local not specified - PPO | Attending: Emergency Medicine | Admitting: Emergency Medicine

## 2018-10-15 ENCOUNTER — Other Ambulatory Visit: Payer: Self-pay

## 2018-10-15 ENCOUNTER — Encounter (HOSPITAL_COMMUNITY): Payer: Self-pay | Admitting: Emergency Medicine

## 2018-10-15 DIAGNOSIS — M5431 Sciatica, right side: Secondary | ICD-10-CM

## 2018-10-15 DIAGNOSIS — M79604 Pain in right leg: Secondary | ICD-10-CM | POA: Diagnosis not present

## 2018-10-15 MED ORDER — METHYLPREDNISOLONE ACETATE 80 MG/ML IJ SUSP
INTRAMUSCULAR | Status: AC
  Filled 2018-10-15: qty 1

## 2018-10-15 MED ORDER — METHYLPREDNISOLONE ACETATE 40 MG/ML IJ SUSP
80.0000 mg | Freq: Once | INTRAMUSCULAR | Status: AC
  Administered 2018-10-15: 80 mg via INTRAMUSCULAR

## 2018-10-15 NOTE — ED Triage Notes (Signed)
Pt here for right upper leg pain starting this morning

## 2018-10-15 NOTE — ED Provider Notes (Signed)
[facility]    CSN: [id number] Arrival date & time: 10/15/18  1945      History   Chief Complaint Chief Complaint  Patient presents with  . Leg Pain    HPI Autumn Green is a 58 y.o. female.   Autumn Green presents with complaints of right leg pain which started today. She had been off of work a few days and today was her first day back. History of back pain and issues, follows with her surgeon- has rods in place. States she is supposed to be getting an MRI. No specific injury. She works for the [employer] and is on her feet a lot. No specific heavy lifting. She has had some headache today. No fever. Hasn't taken any medications for pain. No chest pain , no shortness of breath , doesn't smoke. Doesn't travel. No redness or warmth. She feels like the leg is swollen. States historically she has posterior leg pain which radiates to her foot. Today feels that she even has anterior thigh pain. Feels like the foot feels numb. History of stomach ulcers therefore unable to tolerate nsaids. She is ambulatory. History  Of htn, hyperlipidemia.     ROS per HPI, negative if not otherwise mentioned.      Past Medical History:  Diagnosis Date  . High cholesterol   . Hyperlipidemia 04/17/2016  . Hypertension    about 10 years, well controlled with meds  . Hypothyroidism    about 5 years on oral meds  . Spondylosis of lumbar joint   . Tuberculosis    1983-1985, no recurrance    Patient Active Problem List   Diagnosis Date Noted  . Fatigue 08/05/2018  . Cervicalgia 08/05/2018  . Vitamin D deficiency 08/05/2018  . Acute recurrent maxillary sinusitis 05/19/2018  . Left sided numbness 04/26/2017  . Hypothyroidism   . Acute bronchitis 08/08/2016  . Chest wall pain 08/08/2016  . Hyperlipidemia 04/17/2016  . Hypertension 08/18/2012    Past Surgical History:  Procedure Laterality Date  . ABDOMINAL HYSTERECTOMY     2004  . BACK SURGERY     2009 dicsectomy  . BREAST  SURGERY     had 5 or 6 cyst removed from breast, starting 1979  . LEFT HEART CATHETERIZATION WITH CORONARY ANGIOGRAM N/A 10/14/2012   Procedure: LEFT HEART CATHETERIZATION WITH CORONARY ANGIOGRAM;  Surgeon: Laverda Page, MD;  Location: [facility] CATH LAB;  Service: Cardiovascular;  Laterality: N/A;  . LUMBAR [place] SURGERY  07/17/2011   decompression    OB History   No obstetric history on file.      Home Medications    Prior to Admission medications   Medication Sig Start Date End Date Taking? Authorizing Provider  azelastine (ASTELIN) 0.1 % nasal spray Place 2 sprays into both nostrils 2 (two) times daily. Use in each nostril as directed 05/19/18   Minette Brine, FNP  cyclobenzaprine (FLEXERIL) 10 MG tablet Take 1 tablet (10 mg total) by mouth 3 (three) times daily as needed for muscle spasms. 08/05/18   Minette Brine, FNP  cycloSPORINE (RESTASIS) 0.05 % ophthalmic emulsion Place 1 drop into both eyes 2 (two) times daily.    [provider]  diltiazem (DILACOR XR) 180 MG 24 hr capsule Take 180 mg by mouth daily.    [provider]  ferrous sulfate 325 (65 FE) MG tablet Take 325 mg by mouth 3 (three) times a week.    [provider]  hydrochlorothiazide (HYDRODIURIL) 25 MG  tablet Take 25 mg by mouth daily.    [provider]  levothyroxine (SYNTHROID, LEVOTHROID) 75 MCG tablet Take 75 mcg by mouth daily before breakfast.    [provider]  losartan (COZAAR) 100 MG tablet Take 100 mg by mouth daily. TAKE 1/2 A PILL EVERY OTHER DAY    [provider]  Multiple Vitamin (THERA) TABS Take by mouth.    [provider]  omeprazole (PRILOSEC) 20 MG capsule Take 20 mg by mouth daily.    [provider]    Family History Family History  Problem Relation Age of Onset  . Colon cancer Mother   . Heart attack Father   . Bone cancer Brother   . Thyroid disease Maternal Grandmother   . Thyroid disease Maternal Aunt   . Thyroid  disease Maternal Uncle   . Heart attack Brother   . Stroke Brother   . Hypotension Neg Hx   . Malignant hyperthermia Neg Hx   . Pseudochol deficiency Neg Hx     Social History Social History   Tobacco Use  . Smoking status: Never Smoker  . Smokeless tobacco: Never Used  Substance Use Topics  . Alcohol use: No  . Drug use: No     Allergies   Gabapentin   Review of Systems Review of Systems   Physical Exam Triage Vital Signs ED Triage Vitals [10/15/18 1956]  Enc Vitals Group     BP (!) 149/74     Pulse Rate 83     Resp 18     Temp 98.2 F (36.8 C)     Temp Source Oral     SpO2 96 %     Weight      Height      Head Circumference      Peak Flow      Pain Score 6     Pain Loc      Pain Edu?      Excl. in GC?    No data found.  Updated Vital Signs BP (!) 149/74 (BP Location: Right Arm)   Pulse 83   Temp 98.2 F (36.8 C) (Oral)   Resp 18   SpO2 96%   Visual Acuity Right Eye Distance:   Left Eye Distance:   Bilateral Distance:    Right Eye Near:   Left Eye Near:    Bilateral Near:     Physical Exam Constitutional:      General: She is not in acute distress.    Appearance: She is well-developed.  Cardiovascular:     Rate and Rhythm: Normal rate.  Pulmonary:     Effort: Pulmonary effort is normal.  Musculoskeletal:     Right upper leg: She exhibits tenderness. She exhibits no bony tenderness, no swelling, no edema, no deformity and no laceration.     Right lower leg: She exhibits tenderness. She exhibits no bony tenderness, no swelling, no deformity and no laceration. No edema.     Comments: No specific bony tenderness or joint tenderness; no redness, warmth; gross sensation intact; no palpable cysts or growths; no bruising; increased pain with right hip flexion and right straight leg raise   Skin:    General: Skin is warm and dry.  Neurological:     Mental Status: She is alert and oriented to person, place, and time.      UC Treatments /  Results  Labs (all labs ordered are listed, but only abnormal results are displayed) Labs Reviewed -  No data to display  EKG   Radiology No results found.  Procedures Procedures (including critical care time)  Medications Ordered in UC Medications  methylPREDNISolone acetate (DEPO-MEDROL) injection 80 mg (has no administration in time range)  methylPREDNISolone acetate (DEPO-MEDROL) 80 MG/ML injection (has no administration in time range)    Initial Impression / Assessment and Plan / UC Course  I have reviewed the triage vital signs and the nursing notes.  Pertinent labs & imaging results that were available during my care of the patient were reviewed by me and considered in my medical decision making (see chart for details).     No indications of dvt at this time. Vitals stable. No risk factors. I do feel this is likely related to her back. depomedrol im provided in clinic to try to better help with symptoms. Patient states she will be able to follow up with her PCP tomorrow with the [facility]. Return precautions provided. Patient verbalized understanding and agreeable to plan.  Ambulatory out of clinic without difficulty.    Final Clinical Impressions(s) / UC Diagnoses   Final diagnoses:  Sciatica of right side  Right leg pain     Discharge Instructions     I am more concerned that this is related to your back.  We will try a steroid injection today to try to provide antiinflammatory.  Light and regular activity as tolerated.  Please continue to follow up with your PCP as needed for any persistent symptoms.  If any worsening of symptoms, redness, swelling, firmness, numbness  please go to the ER.    ED Prescriptions    None     Controlled Substance Prescriptions [place] Controlled Substance Registry consulted? Not Applicable   Georgetta HaberBurky, Rohn Fritsch B, NP 10/15/18 2027

## 2018-10-15 NOTE — Discharge Instructions (Signed)
I am more concerned that this is related to your back.  We will try a steroid injection today to try to provide antiinflammatory.  Light and regular activity as tolerated.  Please continue to follow up with your PCP as needed for any persistent symptoms.  If any worsening of symptoms, redness, swelling, firmness, numbness  please go to the ER.

## 2019-02-09 ENCOUNTER — Ambulatory Visit: Payer: Federal, State, Local not specified - PPO | Admitting: Nurse Practitioner

## 2019-03-16 IMAGING — CT CT HEAD W/O CM
4 series · 16 of 47 positions shown, 18 images · non-contrast
Comparison: CT head without contrast 07/09/2006

CLINICAL DATA: Dizziness. New onset left-sided numbness beginning 3
hours ago.

EXAM:
CT HEAD WITHOUT CONTRAST
TECHNIQUE: Contiguous axial images were obtained from the base of the skull
through the vertex without intravenous contrast.

[Series 3: head without · axial · non-contrast · 0.42mm/px · z∈[-90,+25]mm · 7 of 31 slices shown, 9 images]
[im 4/31  brain]
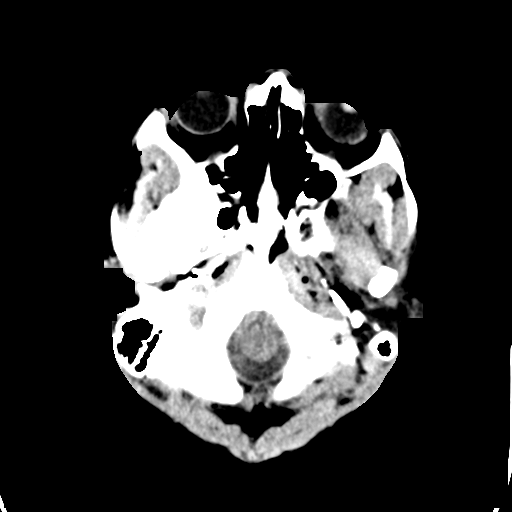
[im 4/31  bone]
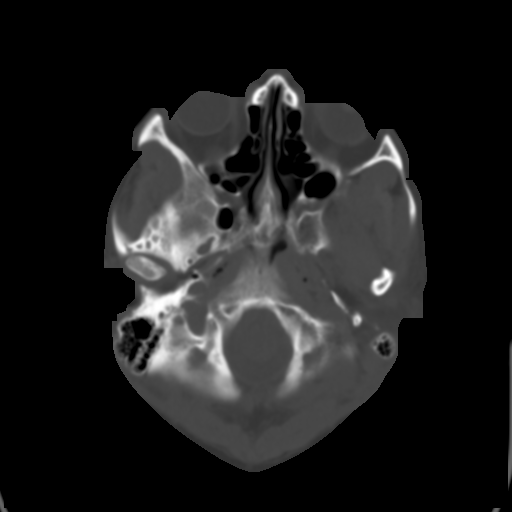
[im 8/31  brain]
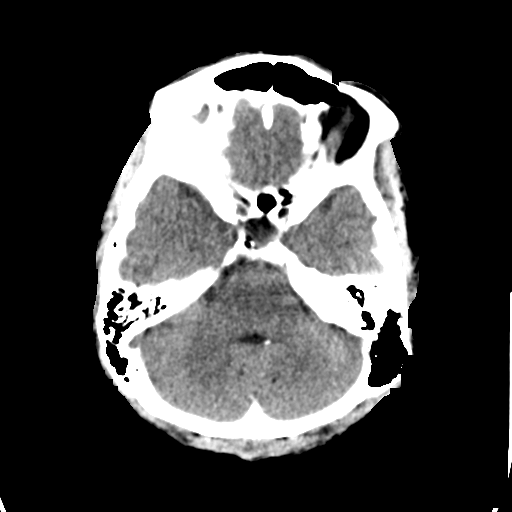
[im 12/31  brain]
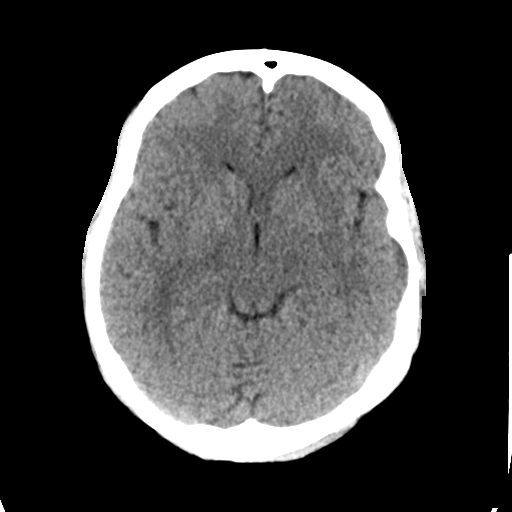
[im 16/31  brain]
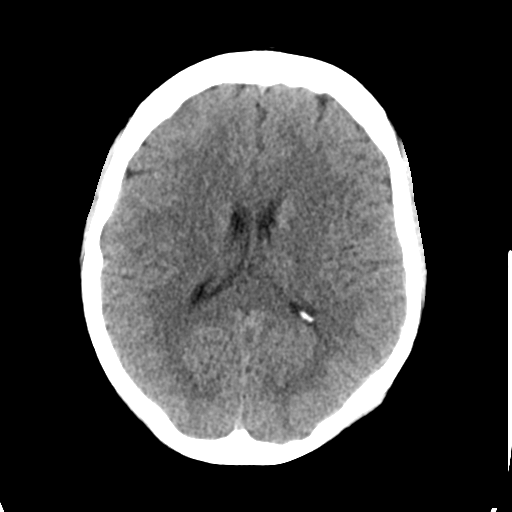
[im 19/31  brain]
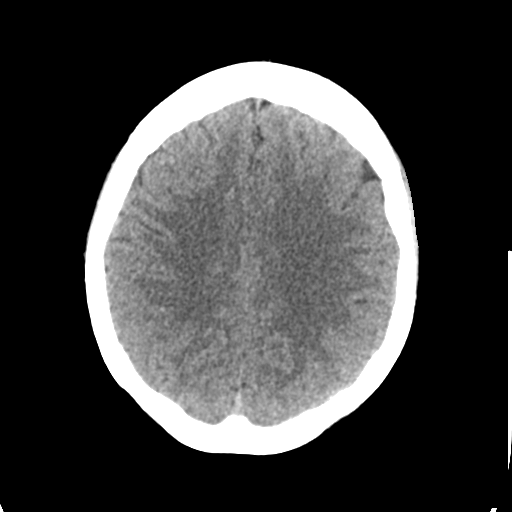
[im 19/31  bone]
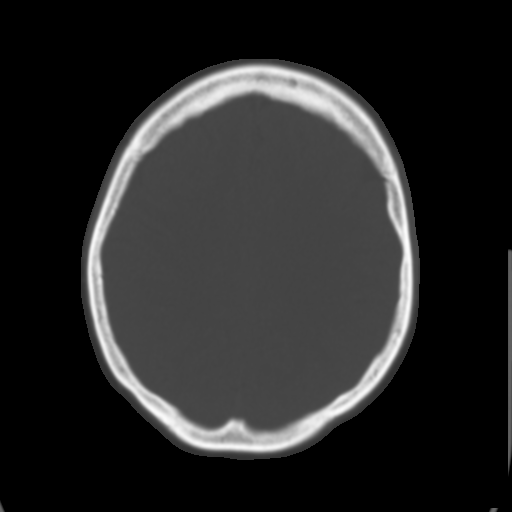
[im 23/31  brain]
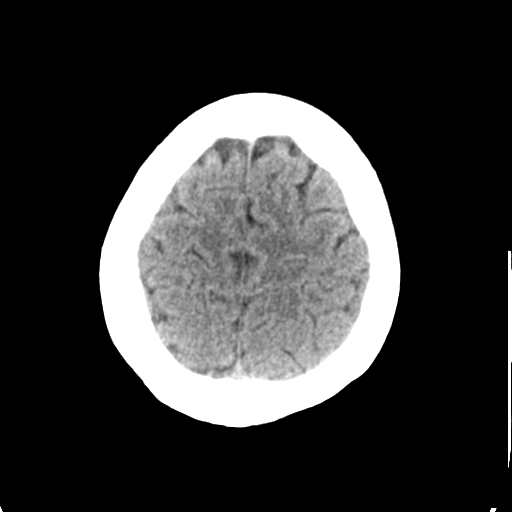
[im 27/31  brain]
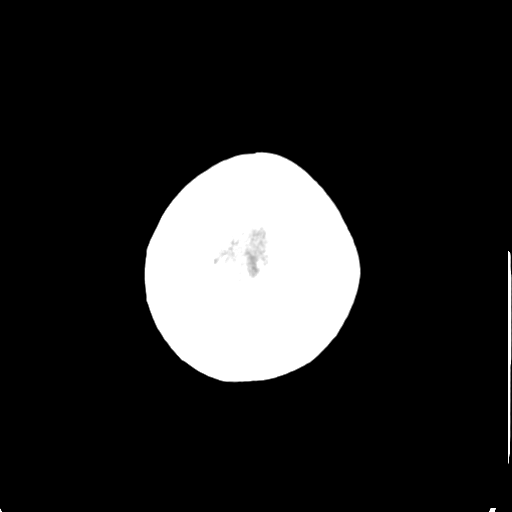

[Series 4: head bone · axial · 0.42mm/px · z∈[-91,-59]mm · 3 of 78 slices shown]
[im 8/78  bone]
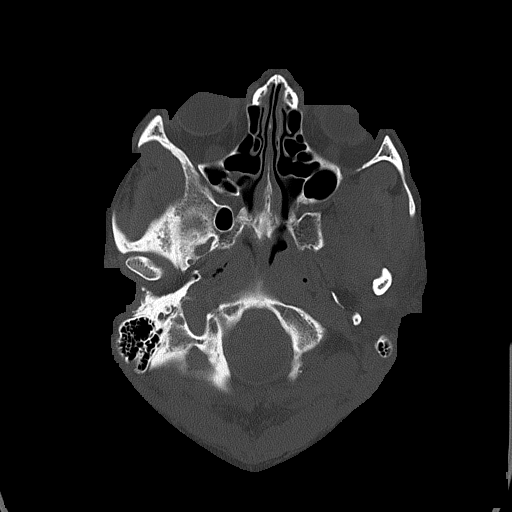
[im 16/78  bone]
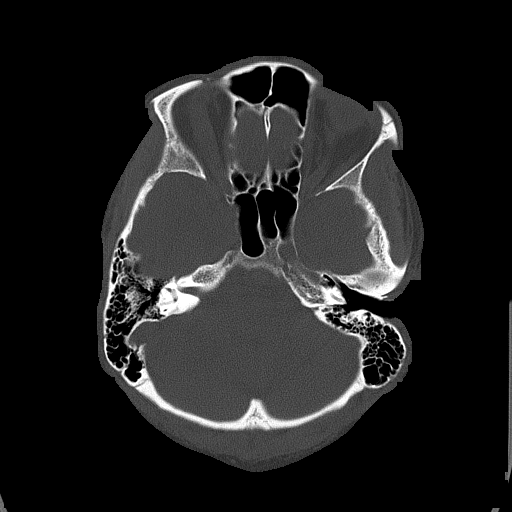
[im 24/78  bone]
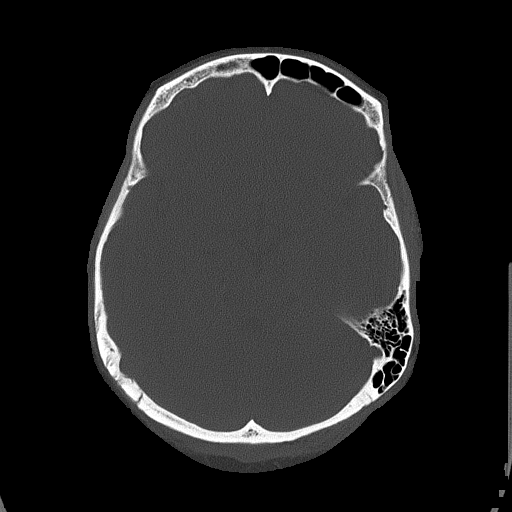

[Series 5: head without cor · coronal · non-contrast · 0.30mm/px · 3 of 65 slices shown]
[im 22/65  brain]
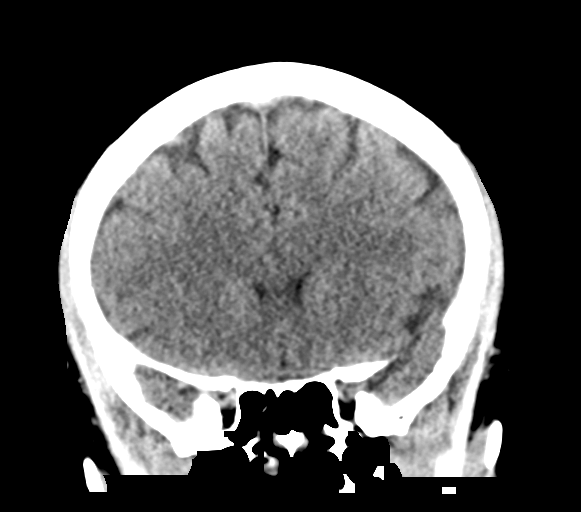
[im 29/65  brain]
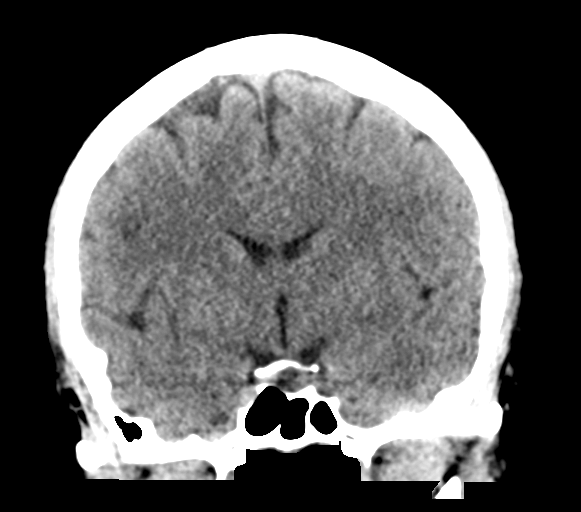
[im 36/65  brain]
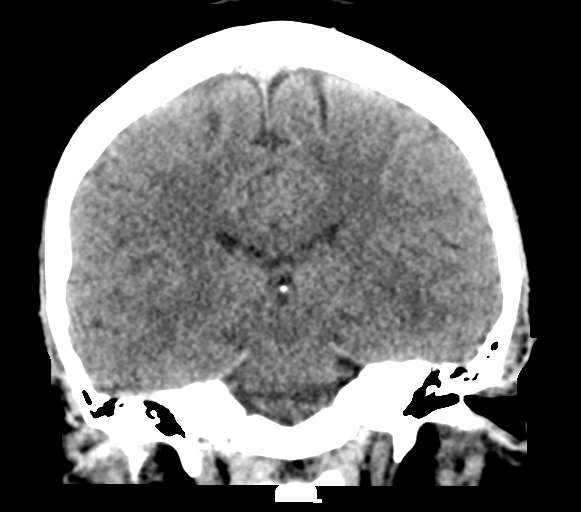

[Series 6: head without sag · sagittal · non-contrast · 0.32mm/px · 3 of 57 slices shown]
[im 19/57  brain]
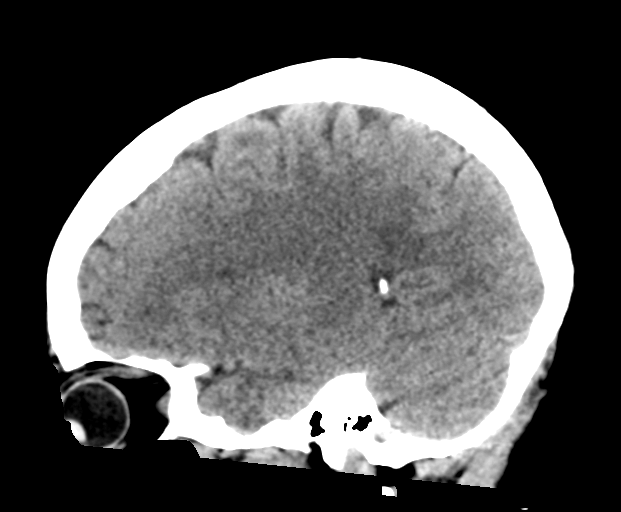
[im 29/57  brain]
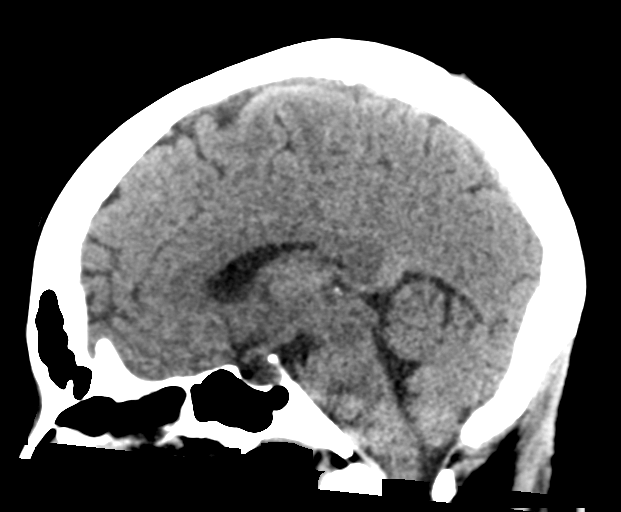
[im 38/57  brain]
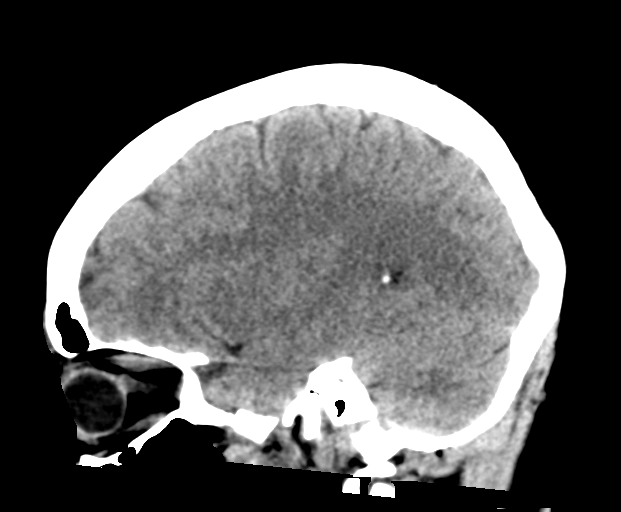

[16 of 47 positions shown; findings below may reference images not displayed]

FINDINGS: Brain: No acute infarct, hemorrhage, or mass lesion is present. The
ventricles are of normal size. No significant extraaxial fluid
collection is present.

No significant white matter disease is present.

Brainstem and cerebellum are normal.

Vascular: No hyperdense vessel or unexpected calcification.

Skull: Calvarium is intact. No focal lytic or blastic lesions are
present. The visualized extracranial soft tissues are within normal
limits.

Sinuses/Orbits: A remote right orbital blowout fracture is present.
The paranasal sinuses are clear. Mastoid air cells are clear. Globes
and orbits are otherwise within normal limits.
IMPRESSION: Negative CT of the head.

## 2019-03-16 IMAGING — MR MR MRA HEAD W/O CM
12 of 19 series · 23 of 48 positions shown · IV contrast (multihance)
Comparison: Head CT 04/26/2017

CLINICAL DATA: Transient ischemic attack

EXAM:
MR HEAD WITHOUT CONTRAST
MR CIRCLE OF WILLIS WITHOUT CONTRAST
MRA OF THE NECK WITHOUT AND WITH CONTRAST
TECHNIQUE: Multiplanar, multiecho pulse sequences of the brain, circle of
willis and surrounding structures were obtained without intravenous
contrast. Angiographic images of the neck were obtained using MRA
technique without and with intravenous contrast.
CONTRAST:  20mL MULTIHANCE GADOBENATE DIMEGLUMINE 529 MG/ML IV SOLN

[Series 3: DWI · axial · 3.0mm · 1.09mm/px · z∈[-129,+15]mm · 2 of 100 slices shown (1 of 4)]
[im 1/100]
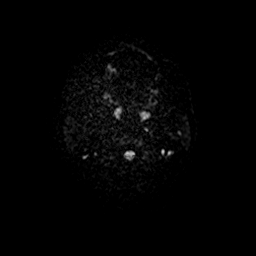
[im 100/100]
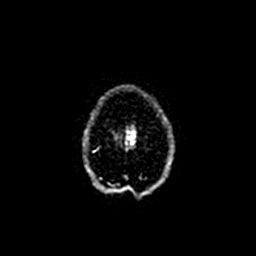

[Series 4: (id) mt fs · axial · 1.4mm · 0.43mm/px · z∈[-117,-26]mm · 4 of 136 slices shown]
[im 1/136]
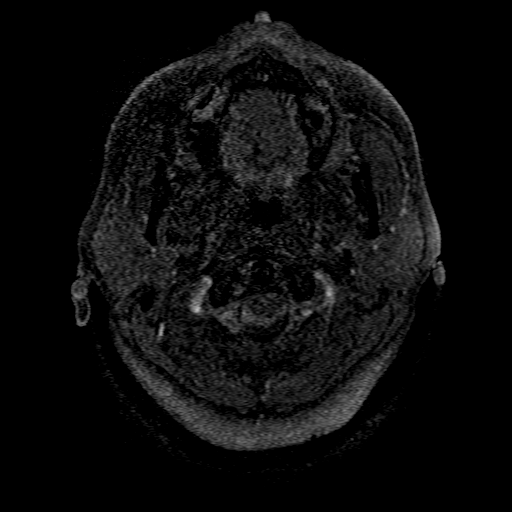
[im 46/136]
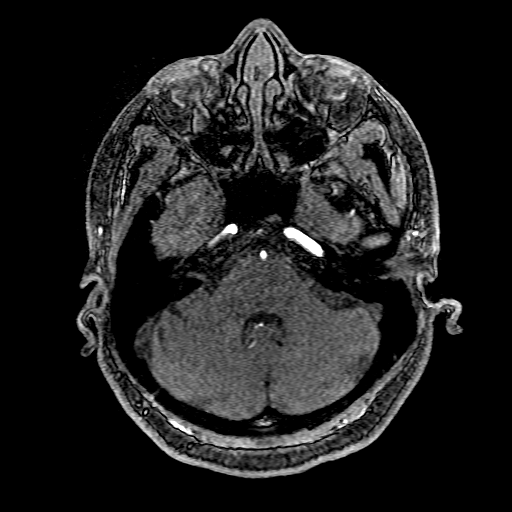
[im 91/136]
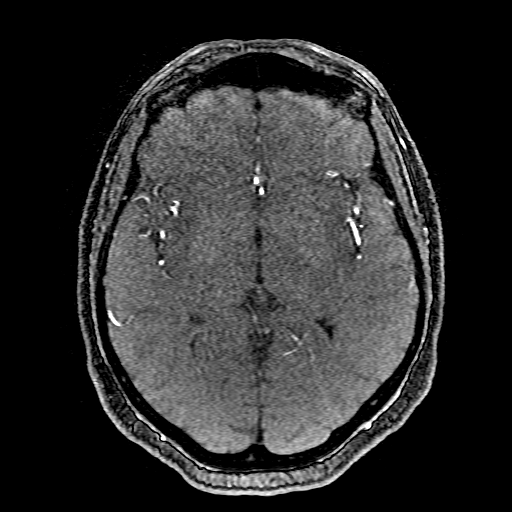
[im 136/136]
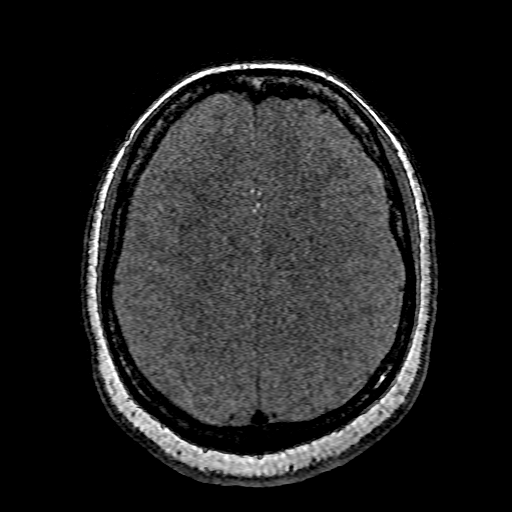

[Series 5: DWI · coronal · 5.0mm · 1.09mm/px · 2 of 82 slices shown (2 of 4)]
[im 1/82]
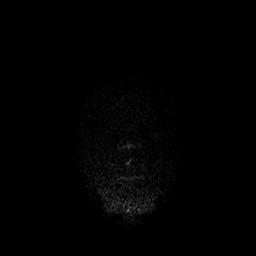
[im 82/82]
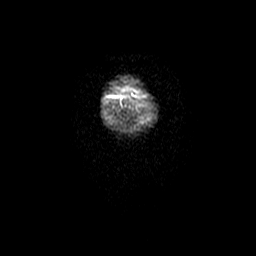

[Series 6: T1 · sagittal · 5.0mm · 0.47mm/px · 1 of 23 slices shown]
[im 1/23]
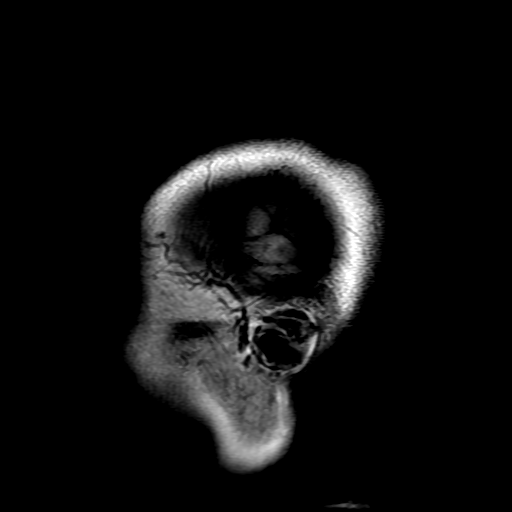

[Series 9: T2 · axial · 5.0mm · 0.43mm/px · 1 of 25 slices shown (1 of 2)]
[im 1/25]
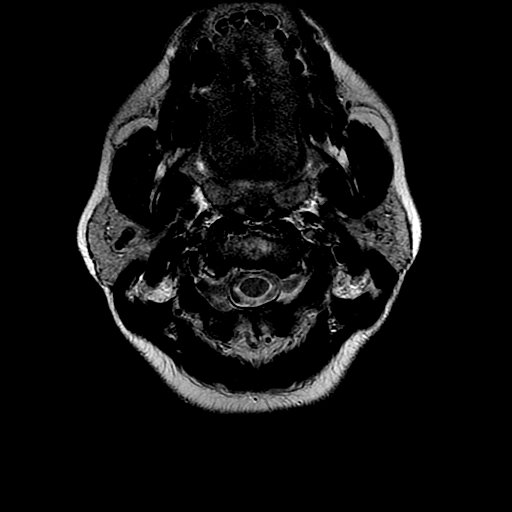

[Series 10: ax mpgr · axial · 5.0mm · 0.45mm/px · 1 of 22 slices shown]
[im 1/22]
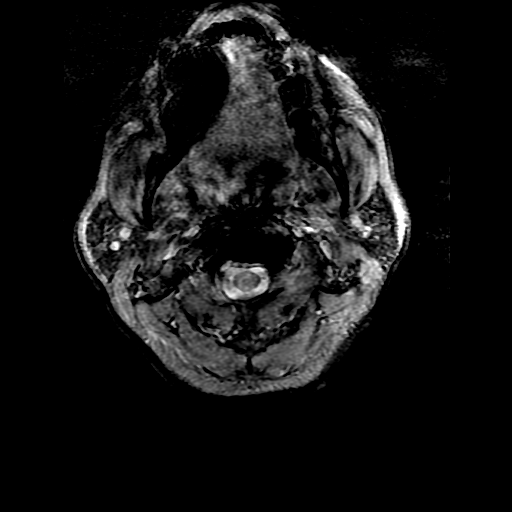

[Series 11: ax (id) · axial · 2.8mm · 0.47mm/px · z∈[-216,-114]mm · 3 of 76 slices shown]
[im 1/76]
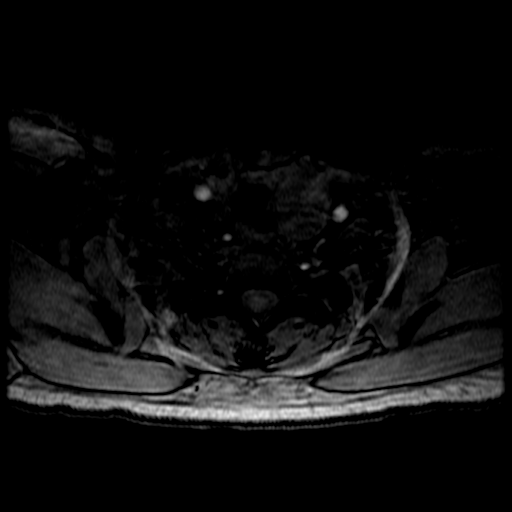
[im 38/76]
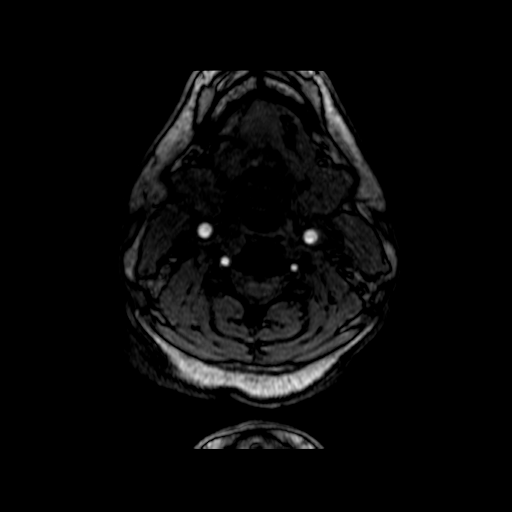
[im 76/76]
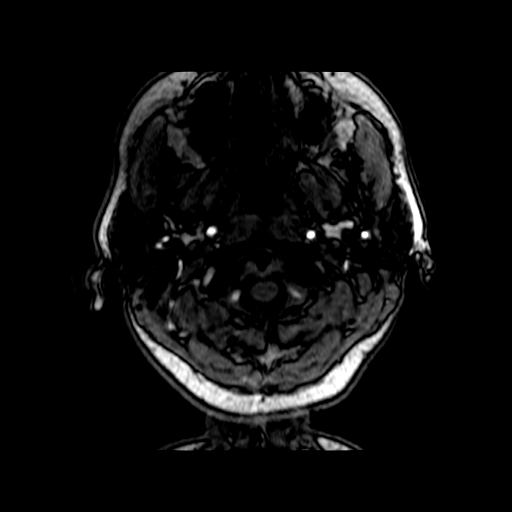

[Series 12: FLAIR · axial · 5.0mm · 0.43mm/px · 1 of 27 slices shown]
[im 1/27]
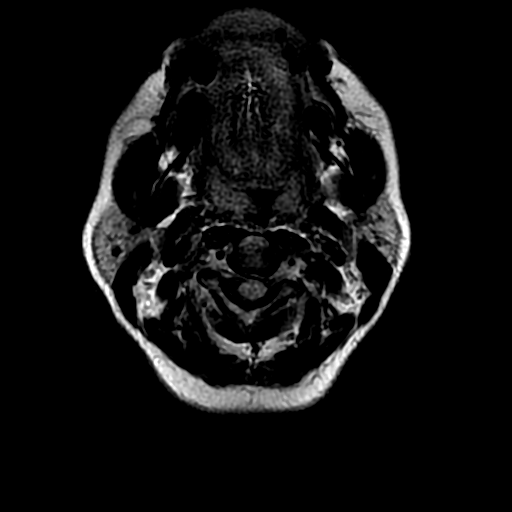

[Series 14: T2 · coronal · 5.0mm · 0.39mm/px · 1 of 25 slices shown (2 of 2)]
[im 1/25]
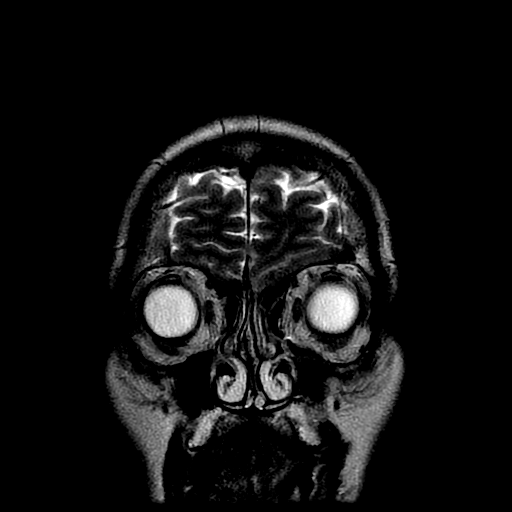

[Series 300: DWI · axial · 3.0mm · 1.09mm/px · z∈[-129,+15]mm · 2 of 50 slices shown (3 of 4)]
[im 1/50]
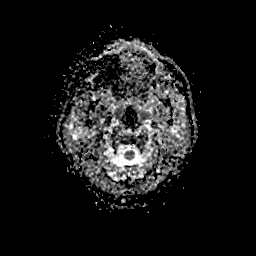
[im 50/50]
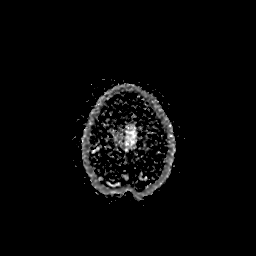

[Series 500: DWI · coronal · 5.0mm · 1.09mm/px · 1 of 41 slices shown (4 of 4)]
[im 1/41]
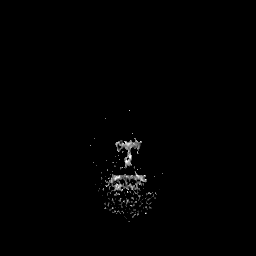

[Series 1600: cor cemra ft · coronal · 1.4mm · 0.61mm/px · 4 of 112 slices shown]
[im 1/112]
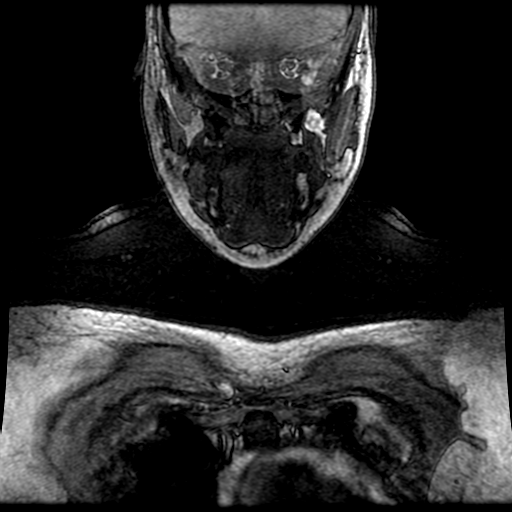
[im 38/112]
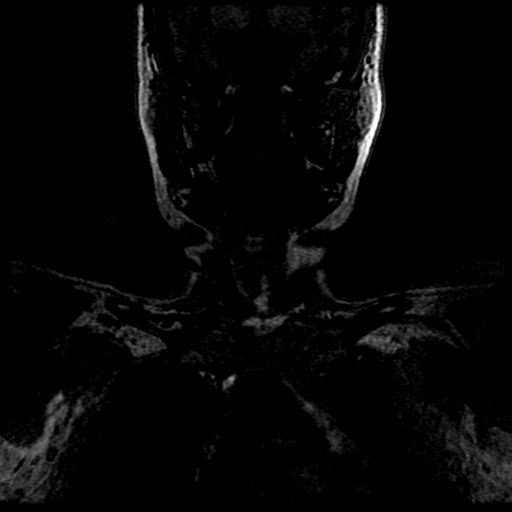
[im 75/112]
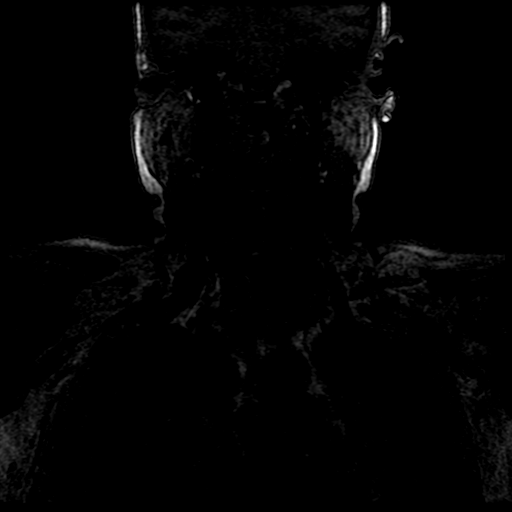
[im 112/112]
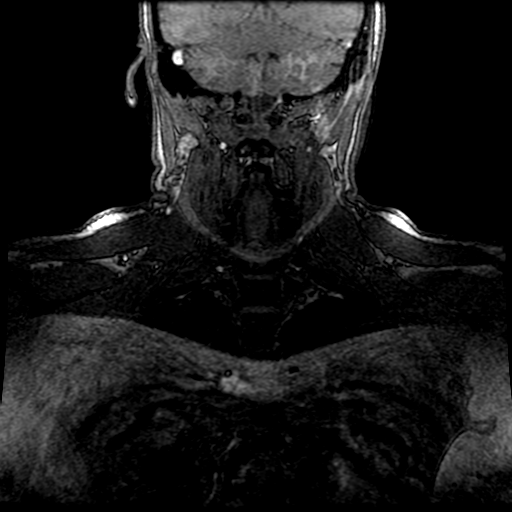

[23 of 48 positions shown; findings below may reference images not displayed]

FINDINGS: MRI HEAD FINDINGS

Brain: The midline structures are normal. No focal diffusion
restriction to indicate acute infarct. No intraparenchymal
hemorrhage. Multifocal juxtacortical and deep white matter
hyperintensity, most often a result of chronic microvascular
ischemia. Focus of subcortical hyperintensity in the right frontal
lobe may be an old infarct. No mass lesion. No chronic
microhemorrhage or cerebral amyloid angiopathy. No hydrocephalus,
age advanced atrophy or lobar predominant volume loss. No dural
abnormality or extra-axial collection.

Skull and upper cervical spine: The visualized skull base,
calvarium, upper cervical spine and extracranial soft tissues are
normal.

Sinuses/Orbits: No fluid levels or advanced mucosal thickening. No
mastoid effusion. Normal orbits.

MRA HEAD FINDINGS

Intracranial internal carotid arteries: Normal.

Anterior cerebral arteries: Normal.

Middle cerebral arteries: Normal.

Posterior communicating arteries: Absent bilaterally.

Posterior cerebral arteries: Normal.

Basilar artery: Normal.

Vertebral arteries: Right dominant.  Normal.

Superior cerebellar arteries: Normal.

Anterior inferior cerebellar arteries: Normal.

Posterior inferior cerebellar arteries: Normal.

MRA NECK FINDINGS

Aortic arch: Normal 3 vessel aortic branching pattern. The
visualized subclavian arteries are normal.

Right carotid system: Normal course and caliber without stenosis or
evidence of dissection.

Left carotid system: Normal course and caliber without stenosis or
evidence of dissection.

Vertebral arteries: Right dominant. Vertebral artery origins are
normal. Vertebral arteries are normal in course and caliber to the
vertebrobasilar confluence without stenosis or evidence of
dissection.
IMPRESSION: 1. Chronic ischemic white matter findings without acute intracranial
abnormality.
2. Normal MRAs of the head and neck.

## 2019-08-06 ENCOUNTER — Encounter: Payer: Federal, State, Local not specified - PPO | Admitting: Nurse Practitioner

## 2021-11-08 ENCOUNTER — Encounter (HOSPITAL_COMMUNITY): Payer: Self-pay | Admitting: Emergency Medicine

## 2021-11-08 ENCOUNTER — Inpatient Hospital Stay (HOSPITAL_COMMUNITY)
Admission: EM | Admit: 2021-11-08 | Discharge: 2021-11-12 | DRG: 076 | Disposition: A | Discharge status: Home / Self Care | Payer: No Typology Code available for payment source | Attending: Family Medicine | Admitting: Family Medicine

## 2021-11-08 ENCOUNTER — Emergency Department (HOSPITAL_COMMUNITY): Payer: No Typology Code available for payment source

## 2021-11-08 ENCOUNTER — Other Ambulatory Visit: Payer: Self-pay

## 2021-11-08 DIAGNOSIS — Z20822 Contact with and (suspected) exposure to covid-19: Secondary | ICD-10-CM | POA: Diagnosis present

## 2021-11-08 DIAGNOSIS — G4489 Other headache syndrome: Secondary | ICD-10-CM | POA: Diagnosis present

## 2021-11-08 DIAGNOSIS — Z800 Family history of malignant neoplasm of digestive organs: Secondary | ICD-10-CM

## 2021-11-08 DIAGNOSIS — E7800 Pure hypercholesterolemia, unspecified: Secondary | ICD-10-CM | POA: Diagnosis present

## 2021-11-08 DIAGNOSIS — Z888 Allergy status to other drugs, medicaments and biological substances status: Secondary | ICD-10-CM

## 2021-11-08 DIAGNOSIS — Z905 Acquired absence of kidney: Secondary | ICD-10-CM

## 2021-11-08 DIAGNOSIS — G039 Meningitis, unspecified: Secondary | ICD-10-CM | POA: Diagnosis present

## 2021-11-08 DIAGNOSIS — G4700 Insomnia, unspecified: Secondary | ICD-10-CM | POA: Diagnosis present

## 2021-11-08 DIAGNOSIS — Z79899 Other long term (current) drug therapy: Secondary | ICD-10-CM

## 2021-11-08 DIAGNOSIS — E039 Hypothyroidism, unspecified: Secondary | ICD-10-CM | POA: Diagnosis present

## 2021-11-08 DIAGNOSIS — I10 Essential (primary) hypertension: Secondary | ICD-10-CM | POA: Diagnosis present

## 2021-11-08 DIAGNOSIS — Z79890 Hormone replacement therapy (postmenopausal): Secondary | ICD-10-CM

## 2021-11-08 DIAGNOSIS — R1084 Generalized abdominal pain: Principal | ICD-10-CM

## 2021-11-08 DIAGNOSIS — Z886 Allergy status to analgesic agent status: Secondary | ICD-10-CM

## 2021-11-08 DIAGNOSIS — Z8249 Family history of ischemic heart disease and other diseases of the circulatory system: Secondary | ICD-10-CM

## 2021-11-08 DIAGNOSIS — B003 Herpesviral meningitis: Secondary | ICD-10-CM | POA: Diagnosis not present

## 2021-11-08 DIAGNOSIS — Z85528 Personal history of other malignant neoplasm of kidney: Secondary | ICD-10-CM

## 2021-11-08 DIAGNOSIS — Z90710 Acquired absence of both cervix and uterus: Secondary | ICD-10-CM

## 2021-11-08 DIAGNOSIS — R509 Fever, unspecified: Secondary | ICD-10-CM

## 2021-11-08 DIAGNOSIS — Z823 Family history of stroke: Secondary | ICD-10-CM

## 2021-11-08 LAB — I-STAT CHEM 8, ED
BUN: 18 mg/dL (ref 8–23)
Calcium, Ion: 1.25 mmol/L (ref 1.15–1.40)
Chloride: 106 mmol/L (ref 98–111)
Creatinine, Ser: 1.1 mg/dL — ABNORMAL HIGH (ref 0.44–1.00)
Glucose, Bld: 99 mg/dL (ref 70–99)
HCT: 41 % (ref 36.0–46.0)
Hemoglobin: 13.9 g/dL (ref 12.0–15.0)
Potassium: 3.9 mmol/L (ref 3.5–5.1)
Sodium: 140 mmol/L (ref 135–145)
TCO2: 27 mmol/L (ref 22–32)

## 2021-11-08 LAB — BASIC METABOLIC PANEL
Anion gap: 11 (ref 5–15)
BUN: 15 mg/dL (ref 8–23)
CO2: 27 mmol/L (ref 22–32)
Calcium: 10.6 mg/dL — ABNORMAL HIGH (ref 8.9–10.3)
Chloride: 103 mmol/L (ref 98–111)
Creatinine, Ser: 1.11 mg/dL — ABNORMAL HIGH (ref 0.44–1.00)
GFR, Estimated: 56 mL/min — ABNORMAL LOW (ref >60)
Glucose, Bld: 107 mg/dL — ABNORMAL HIGH (ref 70–99)
Potassium: 4 mmol/L (ref 3.5–5.1)
Sodium: 141 mmol/L (ref 135–145)

## 2021-11-08 LAB — CBC WITH DIFFERENTIAL/PLATELET
Abs Immature Granulocytes: 0.02 10*3/uL (ref 0.00–0.07)
Basophils Absolute: 0.1 10*3/uL (ref 0.0–0.1)
Basophils Relative: 1 %
Eosinophils Absolute: 0.2 10*3/uL (ref 0.0–0.5)
Eosinophils Relative: 2 %
HCT: 40.7 % (ref 36.0–46.0)
Hemoglobin: 13.8 g/dL (ref 12.0–15.0)
Immature Granulocytes: 0 %
Lymphocytes Relative: 35 %
Lymphs Abs: 2.8 10*3/uL (ref 0.7–4.0)
MCH: 30.5 pg (ref 26.0–34.0)
MCHC: 33.9 g/dL (ref 30.0–36.0)
MCV: 89.8 fL (ref 80.0–100.0)
Monocytes Absolute: 0.5 10*3/uL (ref 0.1–1.0)
Monocytes Relative: 6 %
Neutro Abs: 4.4 10*3/uL (ref 1.7–7.7)
Neutrophils Relative %: 56 %
Platelets: 397 10*3/uL (ref 150–400)
RBC: 4.53 MIL/uL (ref 3.87–5.11)
RDW: 14.1 % (ref 11.5–15.5)
WBC: 7.9 10*3/uL (ref 4.0–10.5)
nRBC: 0 % (ref 0.0–0.2)

## 2021-11-08 LAB — URINALYSIS, ROUTINE W REFLEX MICROSCOPIC
Bacteria, UA: NONE SEEN
Bilirubin Urine: NEGATIVE
Glucose, UA: NEGATIVE mg/dL
Ketones, ur: NEGATIVE mg/dL
Leukocytes,Ua: NEGATIVE
Nitrite: NEGATIVE
Protein, ur: NEGATIVE mg/dL
Specific Gravity, Urine: 1.017 (ref 1.005–1.030)
pH: 5 (ref 5.0–8.0)

## 2021-11-08 LAB — BRAIN NATRIURETIC PEPTIDE: B Natriuretic Peptide: 54.7 pg/mL (ref 0.0–100.0)

## 2021-11-08 LAB — TROPONIN I (HIGH SENSITIVITY): Troponin I (High Sensitivity): 6 ng/L (ref <18)

## 2021-11-08 MED ORDER — ACETAMINOPHEN 325 MG PO TABS
650.0000 mg | ORAL_TABLET | Freq: Once | ORAL | Status: AC
  Administered 2021-11-08: 650 mg via ORAL
  Filled 2021-11-08: qty 2

## 2021-11-08 NOTE — ED Triage Notes (Signed)
Pt c/o headache, neck pain, and chest pain that started today. Pain has been constant.

## 2021-11-08 NOTE — ED Provider Triage Note (Signed)
Emergency Medicine Provider Triage Evaluation Note  Autumn Green , a 61 y.o. female  was evaluated in triage.  Pt complains of worst headache of her life.  Sudden onset several hours ago.  Head pain radiates down the center of her spine and wraps towards the left chest.  Accompanied with chest pain and shortness of breath with bilateral lower leg weakness.  Denies N/V, vision changes, dizziness, or lightheadedness.  Recent surgery for renal cancer excision in June 2023.  No recent trauma or injury.    Review of Systems  Positive:  Negative: See above  Physical Exam  BP (!) 177/87 (BP Location: Right Arm)   Pulse 90   Temp (!) 100.7 F (38.2 C) (Oral)   Resp 17   SpO2 100%  Gen:   Awake, appears uncomfortable Resp:  Normal effort, CTAB, equal rise MSK:   Moves extremities without difficulty  Other:  Borderline tachycardic.  Chest non-TTP.  Gaze aligned appropriately.  Coordination and strength appear grossly intact.  Upper and lower extremities 2+.    Medical Decision Making  Medically screening exam initiated at 9:38 PM.  Appropriate orders placed.  VALLARIE FEI was informed that the remainder of the evaluation will be completed by another provider, this initial triage assessment does not replace that evaluation, and the importance of remaining in the ED until their evaluation is complete.  Tylenol ordered   Cecil Cobbs, PA-C 11/08/21 2147

## 2021-11-09 ENCOUNTER — Observation Stay (HOSPITAL_COMMUNITY): Payer: No Typology Code available for payment source

## 2021-11-09 ENCOUNTER — Emergency Department (HOSPITAL_COMMUNITY): Payer: No Typology Code available for payment source

## 2021-11-09 DIAGNOSIS — B003 Herpesviral meningitis: Secondary | ICD-10-CM

## 2021-11-09 DIAGNOSIS — I10 Essential (primary) hypertension: Secondary | ICD-10-CM | POA: Diagnosis present

## 2021-11-09 DIAGNOSIS — Z905 Acquired absence of kidney: Secondary | ICD-10-CM | POA: Diagnosis not present

## 2021-11-09 DIAGNOSIS — Z800 Family history of malignant neoplasm of digestive organs: Secondary | ICD-10-CM | POA: Diagnosis not present

## 2021-11-09 DIAGNOSIS — B009 Herpesviral infection, unspecified: Secondary | ICD-10-CM

## 2021-11-09 DIAGNOSIS — G4489 Other headache syndrome: Secondary | ICD-10-CM | POA: Diagnosis present

## 2021-11-09 DIAGNOSIS — Z823 Family history of stroke: Secondary | ICD-10-CM | POA: Diagnosis not present

## 2021-11-09 DIAGNOSIS — E039 Hypothyroidism, unspecified: Secondary | ICD-10-CM | POA: Diagnosis present

## 2021-11-09 DIAGNOSIS — Z886 Allergy status to analgesic agent status: Secondary | ICD-10-CM | POA: Diagnosis not present

## 2021-11-09 DIAGNOSIS — Z79899 Other long term (current) drug therapy: Secondary | ICD-10-CM | POA: Diagnosis not present

## 2021-11-09 DIAGNOSIS — Z79890 Hormone replacement therapy (postmenopausal): Secondary | ICD-10-CM | POA: Diagnosis not present

## 2021-11-09 DIAGNOSIS — R509 Fever, unspecified: Secondary | ICD-10-CM

## 2021-11-09 DIAGNOSIS — G039 Meningitis, unspecified: Secondary | ICD-10-CM | POA: Diagnosis present

## 2021-11-09 DIAGNOSIS — G4700 Insomnia, unspecified: Secondary | ICD-10-CM | POA: Diagnosis present

## 2021-11-09 DIAGNOSIS — Z8249 Family history of ischemic heart disease and other diseases of the circulatory system: Secondary | ICD-10-CM | POA: Diagnosis not present

## 2021-11-09 DIAGNOSIS — Z20822 Contact with and (suspected) exposure to covid-19: Secondary | ICD-10-CM | POA: Diagnosis present

## 2021-11-09 DIAGNOSIS — Z90710 Acquired absence of both cervix and uterus: Secondary | ICD-10-CM | POA: Diagnosis not present

## 2021-11-09 DIAGNOSIS — E7800 Pure hypercholesterolemia, unspecified: Secondary | ICD-10-CM | POA: Diagnosis present

## 2021-11-09 DIAGNOSIS — Z888 Allergy status to other drugs, medicaments and biological substances status: Secondary | ICD-10-CM | POA: Diagnosis not present

## 2021-11-09 DIAGNOSIS — R1084 Generalized abdominal pain: Secondary | ICD-10-CM | POA: Diagnosis not present

## 2021-11-09 DIAGNOSIS — Z85528 Personal history of other malignant neoplasm of kidney: Secondary | ICD-10-CM | POA: Diagnosis not present

## 2021-11-09 LAB — CSF CELL COUNT WITH DIFFERENTIAL
Eosinophils, CSF: 0 % (ref 0–1)
Lymphs, CSF: 35 % — ABNORMAL LOW (ref 40–80)
Monocyte-Macrophage-Spinal Fluid: 39 % (ref 15–45)
RBC Count, CSF: 51 /mm3 — ABNORMAL HIGH
Segmented Neutrophils-CSF: 26 % — ABNORMAL HIGH (ref 0–6)
Tube #: 1
WBC, CSF: 400 /mm3 (ref 0–5)

## 2021-11-09 LAB — MENINGITIS/ENCEPHALITIS PANEL (CSF)
Cryptococcus neoformans/gattii (CSF): NOT DETECTED
Cytomegalovirus (CSF): NOT DETECTED
Enterovirus (CSF): NOT DETECTED
Escherichia coli K1 (CSF): NOT DETECTED
Haemophilus influenzae (CSF): NOT DETECTED
Herpes simplex virus 1 (CSF): NOT DETECTED
Herpes simplex virus 2 (CSF): DETECTED — AB
Human herpesvirus 6 (CSF): NOT DETECTED
Human parechovirus (CSF): NOT DETECTED
Listeria monocytogenes (CSF): NOT DETECTED
Neisseria meningitis (CSF): NOT DETECTED
Streptococcus agalactiae (CSF): NOT DETECTED
Streptococcus pneumoniae (CSF): NOT DETECTED
Varicella zoster virus (CSF): NOT DETECTED

## 2021-11-09 LAB — LACTIC ACID, PLASMA
Lactic Acid, Venous: 0.9 mmol/L (ref 0.5–1.9)
Lactic Acid, Venous: 1.1 mmol/L (ref 0.5–1.9)

## 2021-11-09 LAB — SARS CORONAVIRUS 2 BY RT PCR: SARS Coronavirus 2 by RT PCR: NEGATIVE

## 2021-11-09 LAB — HIV ANTIBODY (ROUTINE TESTING W REFLEX): HIV Screen 4th Generation wRfx: NONREACTIVE

## 2021-11-09 LAB — TROPONIN I (HIGH SENSITIVITY): Troponin I (High Sensitivity): 6 ng/L (ref <18)

## 2021-11-09 LAB — PROTEIN, CSF: Total  Protein, CSF: 61 mg/dL — ABNORMAL HIGH (ref 15–45)

## 2021-11-09 LAB — GLUCOSE, CSF: Glucose, CSF: 63 mg/dL (ref 40–70)

## 2021-11-09 MED ORDER — DEXTROSE 5 % IV SOLN
700.0000 mg | Freq: Three times a day (TID) | INTRAVENOUS | Status: DC
  Administered 2021-11-09 – 2021-11-12 (×8): 700 mg via INTRAVENOUS
  Filled 2021-11-09 (×12): qty 14

## 2021-11-09 MED ORDER — DEXTROSE 5 % IV SOLN
700.0000 mg | Freq: Once | INTRAVENOUS | Status: AC
Start: 2021-11-09 — End: 2021-11-09
  Administered 2021-11-09: 700 mg via INTRAVENOUS
  Filled 2021-11-09: qty 14

## 2021-11-09 MED ORDER — ACETAMINOPHEN 650 MG RE SUPP: 650.0000 mg | Freq: Four times a day (QID) | RECTAL | Status: DC | PRN

## 2021-11-09 MED ORDER — MORPHINE SULFATE (PF) 4 MG/ML IV SOLN
4.0000 mg | Freq: Once | INTRAVENOUS | Status: AC
  Administered 2021-11-09: 4 mg via INTRAVENOUS
  Filled 2021-11-09: qty 1

## 2021-11-09 MED ORDER — ONDANSETRON HCL 4 MG/2ML IJ SOLN
4.0000 mg | Freq: Four times a day (QID) | INTRAMUSCULAR | Status: DC | PRN
  Administered 2021-11-10: 4 mg via INTRAVENOUS
  Filled 2021-11-09: qty 2

## 2021-11-09 MED ORDER — IOHEXOL 300 MG/ML  SOLN
100.0000 mL | Freq: Once | INTRAMUSCULAR | Status: AC | PRN
  Administered 2021-11-09: 100 mL via INTRAVENOUS

## 2021-11-09 MED ORDER — VANCOMYCIN HCL IN DEXTROSE 1-5 GM/200ML-% IV SOLN: 1000.0000 mg | Freq: Once | INTRAVENOUS | Status: DC

## 2021-11-09 MED ORDER — ONDANSETRON HCL 4 MG/2ML IJ SOLN
4.0000 mg | Freq: Once | INTRAMUSCULAR | Status: AC
  Administered 2021-11-09: 4 mg via INTRAVENOUS
  Filled 2021-11-09: qty 2

## 2021-11-09 MED ORDER — LACTATED RINGERS IV SOLN: INTRAVENOUS | Status: DC

## 2021-11-09 MED ORDER — FENTANYL CITRATE PF 50 MCG/ML IJ SOSY
100.0000 ug | PREFILLED_SYRINGE | Freq: Once | INTRAMUSCULAR | Status: AC
  Administered 2021-11-09: 100 ug via INTRAVENOUS
  Filled 2021-11-09: qty 2

## 2021-11-09 MED ORDER — ACETAMINOPHEN 325 MG PO TABS
650.0000 mg | ORAL_TABLET | Freq: Four times a day (QID) | ORAL | Status: DC | PRN
  Administered 2021-11-09 – 2021-11-11 (×4): 650 mg via ORAL
  Filled 2021-11-09 (×4): qty 2

## 2021-11-09 MED ORDER — VANCOMYCIN HCL 750 MG/150ML IV SOLN
750.0000 mg | Freq: Two times a day (BID) | INTRAVENOUS | Status: DC
  Filled 2021-11-09: qty 150

## 2021-11-09 MED ORDER — SODIUM CHLORIDE 0.9 % IV SOLN
2.0000 g | Freq: Once | INTRAVENOUS | Status: AC
  Administered 2021-11-09: 2 g via INTRAVENOUS
  Filled 2021-11-09: qty 20

## 2021-11-09 MED ORDER — SODIUM CHLORIDE 0.9 % IV SOLN
2.0000 g | INTRAVENOUS | Status: DC
  Administered 2021-11-09: 2 g via INTRAVENOUS
  Filled 2021-11-09 (×3): qty 2000

## 2021-11-09 MED ORDER — SODIUM CHLORIDE 0.9 % IV SOLN: 2.0000 g | Freq: Two times a day (BID) | INTRAVENOUS | Status: DC

## 2021-11-09 MED ORDER — MORPHINE SULFATE (PF) 2 MG/ML IV SOLN: 2.0000 mg | INTRAVENOUS | Status: DC | PRN

## 2021-11-09 MED ORDER — LIDOCAINE HCL (PF) 1 % IJ SOLN
5.0000 mL | Freq: Once | INTRAMUSCULAR | Status: AC
  Administered 2021-11-09: 5 mL via INTRADERMAL

## 2021-11-09 MED ORDER — ONDANSETRON HCL 4 MG PO TABS: 4.0000 mg | ORAL_TABLET | Freq: Four times a day (QID) | ORAL | Status: DC | PRN

## 2021-11-09 MED ORDER — VANCOMYCIN HCL 2000 MG/400ML IV SOLN
2000.0000 mg | Freq: Once | INTRAVENOUS | Status: AC
Start: 2021-11-09 — End: 2021-11-09
  Administered 2021-11-09: 2000 mg via INTRAVENOUS
  Filled 2021-11-09: qty 400

## 2021-11-09 NOTE — Progress Notes (Signed)
Pharmacy Antibiotic Note  Autumn Green is a 61 y.o. female admitted on 11/08/2021 with concern for meningitis.  Pharmacy has been consulted for vancomycin dosing.  Plan: Vancomycin 2000mg  x1 then 750mg  IV every 12 hours.  Goal trough 15-20 mcg/mL. Also started appropriately on ceftriaxone.  Height: 5\' 3"  (160 cm) Weight: 94.3 kg (208 lb) IBW/kg (Calculated) : 52.4  Temp (24hrs), Avg:99.5 F (37.5 C), Min:98.7 F (37.1 C), Max:100.7 F (38.2 C)  Recent Labs  Lab 11/08/21 2145 11/08/21 2221 11/09/21 0230 11/09/21 0346  WBC 7.9  --   --   --   CREATININE 1.11* 1.10*  --   --   LATICACIDVEN  --   --  0.9 1.1    Estimated Creatinine Clearance: 57.2 mL/min (A) (by C-G formula based on SCr of 1.1 mg/dL (H)).    Allergies  Allergen Reactions   Gabapentin Swelling    Thank you for allowing pharmacy to be a part of this patient's care.  2222, PharmD, BCPS  11/09/2021 6:11 AM

## 2021-11-09 NOTE — H&P (Signed)
History and Physical    Patient: Autumn Green MWU:[id number] DOB: 02-01-1961 DOA: 11/08/2021 DOS: the patient was seen and examined on 11/09/2021 PCP: [facility]  Patient coming from: Home  Chief Complaint:  Chief Complaint  Patient presents with   Headache   Chest Pain   HPI: Autumn Green is a 61 y.o. female with medical history significant of RCC s/p partial left nephrectomy in June.  Pt presents to ED with onset of headache, fever, body aches, stiff neck.  Symptoms onset at 11AM yesterday.  Worsening.  Persistent.  In to ED.  No cough, no dysuria.   Review of Systems: As mentioned in the history of present illness. All other systems reviewed and are negative. Past Medical History:  Diagnosis Date   High cholesterol    Hyperlipidemia 04/17/2016   Hypertension    about 10 years, well controlled with meds   Hypothyroidism    about 5 years on oral meds   Spondylosis of lumbar joint    Tuberculosis    1983-1985, no recurrance   Past Surgical History:  Procedure Laterality Date   ABDOMINAL HYSTERECTOMY     2004   BACK SURGERY     2009 dicsectomy   BREAST SURGERY     had 5 or 6 cyst removed from breast, starting 1979   LEFT HEART CATHETERIZATION WITH CORONARY ANGIOGRAM N/A 10/14/2012   Procedure: LEFT HEART CATHETERIZATION WITH CORONARY ANGIOGRAM;  Surgeon: Pamella Pert, MD;  Location: [facility] CATH LAB;  Service: Cardiovascular;  Laterality: N/A;   LUMBAR DISC SURGERY  07/17/2011   decompression   Social History:  reports that she has never smoked. She has never used smokeless tobacco. She reports that she does not drink alcohol and does not use drugs.  Allergies  Allergen Reactions   Gabapentin Swelling    Family History  Problem Relation Age of Onset   Colon cancer Mother    Heart attack Father    Bone cancer Brother    Thyroid disease Maternal Grandmother    Thyroid disease Maternal Aunt    Thyroid disease Maternal Uncle    Heart attack Brother     Stroke Brother    Hypotension Neg Hx    Malignant hyperthermia Neg Hx    Pseudochol deficiency Neg Hx     Prior to Admission medications   Medication Sig Start Date End Date Taking? Authorizing Provider  azelastine (ASTELIN) 0.1 % nasal spray Place 2 sprays into both nostrils 2 (two) times daily. Use in each nostril as directed 05/19/18   Arnette Felts, FNP  cyclobenzaprine (FLEXERIL) 10 MG tablet Take 1 tablet (10 mg total) by mouth 3 (three) times daily as needed for muscle spasms. 08/05/18   Arnette Felts, FNP  cycloSPORINE (RESTASIS) 0.05 % ophthalmic emulsion Place 1 drop into both eyes 2 (two) times daily.    [provider]  diltiazem (DILACOR XR) 180 MG 24 hr capsule Take 180 mg by mouth daily.    [provider]  ferrous sulfate 325 (65 FE) MG tablet Take 325 mg by mouth 3 (three) times a week.    [provider]  hydrochlorothiazide (HYDRODIURIL) 25 MG tablet Take 25 mg by mouth daily.    [provider]  levothyroxine (SYNTHROID, LEVOTHROID) 75 MCG tablet Take 75 mcg by mouth daily before breakfast.    [provider]  losartan (COZAAR) 100 MG tablet Take 100 mg by mouth daily. TAKE 1/2 A PILL EVERY OTHER DAY  [provider]  Multiple Vitamin (THERA) TABS Take by mouth.    [provider]  omeprazole (PRILOSEC) 20 MG capsule Take 20 mg by mouth daily.    [provider]    Physical Exam: Vitals:   11/09/21 0310 11/09/21 0400 11/09/21 0500 11/09/21 0600  BP: (!) 166/78 (!) 176/86 (!) 153/77 (!) 152/85  Pulse: 81 65 87 80  Resp: 16 (!) 21 19 19   Temp:    98.7 F (37.1 C)  TempSrc:    Oral  SpO2: 93% 98% 97% 99%  Weight:      Height:       Constitutional: NAD, calm, comfortable Eyes: PERRL, lids and conjunctivae normal ENMT: Mucous membranes are moist. Posterior pharynx clear of any exudate or lesions.Normal dentition.  Neck: normal, meningismus present, no masses, no thyromegaly Respiratory: clear to  auscultation bilaterally, no wheezing, no crackles. Normal respiratory effort. No accessory muscle use.  Cardiovascular: Regular rate and rhythm, no murmurs / rubs / gallops. No extremity edema. 2+ pedal pulses. No carotid bruits.  Abdomen: no tenderness, no masses palpated. No hepatosplenomegaly. Bowel sounds positive.  Musculoskeletal: no clubbing / cyanosis. No joint deformity upper and lower extremities. Good ROM, no contractures. Normal muscle tone.  Skin: no rashes, lesions, ulcers. No induration Neurologic: CN 2-12 grossly intact. Sensation intact, DTR normal. Strength 5/5 in all 4.  Psychiatric: Normal judgment and insight. Alert and oriented x 3. Normal mood.   Data Reviewed:       Latest Ref Rng & Units 11/08/2021   10:21 PM 11/08/2021    9:45 PM 04/28/2017    9:39 AM  CBC  WBC 4.0 - 10.5 K/uL  7.9  5.3   Hemoglobin 12.0 - 15.0 g/dL 04/30/2017  44.8  18.5   Hematocrit 36.0 - 46.0 % 41.0  40.7  35.8   Platelets 150 - 400 K/uL  397  341    CMP     Component Value Date/Time   NA 140 11/08/2021 2221   NA 144 05/19/2018 0916   K 3.9 11/08/2021 2221   CL 106 11/08/2021 2221   CO2 27 11/08/2021 2145   GLUCOSE 99 11/08/2021 2221   BUN 18 11/08/2021 2221   BUN 12 05/19/2018 0916   CREATININE 1.10 (H) 11/08/2021 2221   CALCIUM 10.6 (H) 11/08/2021 2145   PROT 6.7 04/26/2017 1504   ALBUMIN 3.4 (L) 04/26/2017 1504   AST 23 04/26/2017 1504   ALT 20 04/26/2017 1504   ALKPHOS 67 04/26/2017 1504   BILITOT 0.5 04/26/2017 1504   GFRNONAA 56 (L) 11/08/2021 2145   GFRAA 96 05/19/2018 0916   Lactate nl x2  CT AP = IMPRESSION: 1. 5 mm nonobstructing right renal calculus. 2. Left-sided renal cortical scarring, with additional findings that may represent a small, adjacent infected renal cyst. Correlation with renal ultrasound is recommended. 3. Small hiatal hernia. 4. Postoperative changes at the levels of L4, L5 and S1. 5. Aortic atherosclerosis.  COVID neg   Assessment and  Plan: * Meningitis Pt with headache, fever, neck stiffness.  Concerning for meningitis.  Bacterial vs aseptic (viral). Empiric ABx for the moment for meningitis. IR LP ordered (EDP unable to obtain due to h/o spine surgery / fusion). Pt not septic, just has fever, nl WBC. ? Infected cyst / abscess around L kidney: UA neg Would be a long time out for a post op infection from the partial nephrectomy (this was done at beginning of June, nearly 3 months ago now). Getting  renal US.  Hypertension Cont home BP meds when med rec complete.      Advance Care Planning:   Code Status: Full Code  Consults: None  Family Communication: No family in room  Severity of Illness: The appropriate patient status for this patient is OBSERVATION. Observation status is judged to be reasonable and necessary in order to provide the required intensity of service to ensure the patient's safety. The patient's presenting symptoms, physical exam findings, and initial radiographic and laboratory data in the context of their medical condition is felt to place them at decreased risk for further clinical deterioration. Furthermore, it is anticipated that the patient will be medically stable for discharge from the hospital within 2 midnights of admission.   Author: Hillary Bow., DO 11/09/2021 6:16 AM  For on call review [email or web address].

## 2021-11-09 NOTE — Procedures (Signed)
Successful fluoro guided LP via L4-5 approach. Opening pressure 14 cm water, 9 mL clear colorless fluid removed. Pt tolerated well.  Brayton El PA-C Interventional Radiology 11/09/2021 9:15 AM

## 2021-11-09 NOTE — ED Notes (Signed)
Pt aware of scheduled LP at 0830. Pt verbalizes understanding. PT aware of needed labels to go with her to IR if transport comes to take her. PT verbalizes understanding. Pt denies any needs at this time.

## 2021-11-09 NOTE — Progress Notes (Signed)
Subjective: Patient admitted this morning, see detailed H&P by Dr Julian Reil 61 year old female with medical history of RCC status post partial left nephrectomy in June came to ED with complaints of headache, fever, body aches, stiff neck.  LP could not be done in the ED as patient has history of spine surgery/fusion.  IR was consulted for LP.  Patient underwent LP, CSF results were consistent with bacterial meningitis.  Patient started on ampicillin in addition to vancomycin and ceftriaxone.  Vitals:   11/09/21 0739 11/09/21 0800  BP:  124/62  Pulse:  76  Resp:  19  Temp:    SpO2: 99% 96%      A/P  Meningitis -Meningitis/encephalitis panel ordered -Continue empiric antibiotics, vancomycin, ceftriaxone, ampicillin -Consult infectious disease for further recommendations  Hypertension -Blood pressure is controlled  Meredeth Ide [facility]

## 2021-11-09 NOTE — Progress Notes (Signed)
Pharmacy Antibiotic Note  Autumn Green is a 61 y.o. 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  Pharmacy has been consulted for Acyclovir dosing.  SCr 1.1 - will monitor closely on Acyclovir. Estimated CrCl>50 ml/min. Will ensure MIVF provide hydration and prevent nephrotoxicity.  BMI~36, will utilize AdjBW for Acyclovir dosing.  Plan: - Start Acyclovir 700 mg (~10 mg/kg AdjBW) every 8 hours - LR @ 125 cc/hr to provide adequate hydration and prevent nephrotoxicity - Will monitor renal function closely for any necessary dose adjustments  Height: 5\' 3"  (160 cm) Weight: 94.3 kg (208 lb) IBW/kg (Calculated) : 52.4  Temp (24hrs), Avg:99.2 F (37.3 C), Min:98.4 F (36.9 C), Max:100.7 F (38.2 C)  Recent Labs  Lab 11/08/21 2145 11/08/21 2221 11/09/21 0230 11/09/21 0346  WBC 7.9  --   --   --   CREATININE 1.11* 1.10*  --   --   LATICACIDVEN  --   --  0.9 1.1    Estimated Creatinine Clearance: 57.2 mL/min (A) (by C-G formula based on SCr of 1.1 mg/dL (H)).    Allergies  Allergen Reactions   Aspirin Other (See Comments)    Told to avoid due to stomach ulcers   Effexor [Venlafaxine] Other (See Comments)    Dysphoric mood   Lipitor [Atorvastatin] Other (See Comments)    Myalgias    Neurontin [Gabapentin] Swelling    Facial swelling   Nsaids Other (See Comments)    Told to avoid due to stomach ulcer   Zestril [Lisinopril] Cough    Antimicrobials this admission: Vancomycin 8/31 x 1 Rocephin 8/31 x 1 Ampicillin 8/31 x 1 Acyclovir 8/31 >>  Dose adjustments this admission: N/a  Microbiology results: 8/31 COVID >> neg 8/31 BCx >> ng<12h 8/31 CSF cx >> pending 8/31 Meningitis/Encephalitis panel >> +HSV2  Thank you for allowing pharmacy to be a part of this patient's care.  9/31, PharmD, BCPS Infectious Diseases Clinical Pharmacist 11/09/2021 1:34 PM   **Pharmacist phone directory can now be found on [email or web address] ([email or web address]).  Listed  under [facility].

## 2021-11-09 NOTE — ED Notes (Addendum)
Pt transported to IR at this time via stretcher. Pt to be transferred to room 46 upon returning. All pt belongings placed in room 46. Report given to Toney Sang RN and Vickie Epley in IR aware.

## 2021-11-09 NOTE — Assessment & Plan Note (Signed)
Pt with headache, fever, neck stiffness.  Concerning for meningitis.  Bacterial vs aseptic (viral). 1. Empiric ABx for the moment for meningitis. 2. IR LP ordered (EDP unable to obtain due to h/o spine surgery / fusion). 3. Pt not septic, just has fever, nl WBC. 4. ? Infected cyst / abscess around L kidney: 1. UA neg 2. Would be a long time out for a post op infection from the partial nephrectomy (this was done at beginning of June, nearly 3 months ago now). 3. Getting renal US.

## 2021-11-09 NOTE — Consult Note (Signed)
[facility]    Date of Admission:  11/08/2021     Reason for Consult: Meningitis     Referring Physician: Dr Sharl Ma  Current antibiotics: Vancomycin Ceftriaxone Ampicillin   ASSESSMENT:    61 y.o. female admitted with:  Meningitis: patient presenting with symptoms consistent with meningitis.  No encephalitis symptoms.  She underwent LP consistent with aseptic meningitis (400 WBC, 26% PMNs, 35% lymphocytes, protein 61, glucose 63).  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  I wonder if the lesion in her right ear canal was herpetic and contributed to her overall presentation.   RECOMMENDATIONS:    Stop vancomycin, ceftriaxone, ampicillin Start acyclovir per pharmacy Lab monitoring Will follow   Principal Problem:   Meningitis Active Problems:   Hypertension   MEDICATIONS:    Scheduled Meds: Continuous Infusions:  ampicillin (OMNIPEN) IV 2 g (11/09/21 1238)   cefTRIAXone (ROCEPHIN)  IV     lactated ringers Stopped (11/09/21 0830)   vancomycin     PRN Meds:.acetaminophen **OR** acetaminophen, morphine injection, ondansetron **OR** ondansetron (ZOFRAN) IV  HPI:    Autumn Green is a 61 y.o. female with PMHx of HTN, HLD, hypothyroidism, prior lumbar spine surgery, and RCC s/p partial nephrectomy in June who presented to the ED with acute onset headache, fevers, chills, and stiff neck since approximately 10:30am yesterday morning after going for a walk.  She reports being in her usual state of health up until that time.  She had recent nephrectomy in June 2023 at the [facility] and recently took a 10-day course of meloxicam for pain from 10/13/21 - 10/23/21 but reports no other new medications.  She has no recent travel.  She had an ear ache a couple weeks back with a small lesion in her ear canal that resolved with neosporin.  She felt like her balance was slightly  off after this but has been improving.  No other localizing symptoms.  No rashes.  CT head was negative.  CT abd/pelvis raised possibility of an infected renal cyst but renal US was unremarkable.  She underwent LP this morning concerning for meningitis and we have been consulted for further recommendations.    Past Medical History:  Diagnosis Date   High cholesterol    Hyperlipidemia 04/17/2016   Hypertension    about 10 years, well controlled with meds   Hypothyroidism    about 5 years on oral meds   Spondylosis of lumbar joint    Tuberculosis    1983-1985, no recurrance    Social History   Tobacco Use   Smoking status: Never   Smokeless tobacco: Never  Substance Use Topics   Alcohol use: No   Drug use: No    Family History  Problem Relation Age of Onset   Colon cancer Mother    Heart attack Father    Bone cancer Brother    Thyroid disease Maternal Grandmother    Thyroid disease Maternal Aunt    Thyroid disease Maternal Uncle    Heart attack Brother    Stroke Brother    Hypotension Neg Hx    Malignant hyperthermia Neg Hx    Pseudochol deficiency Neg Hx     Allergies  Allergen Reactions   Aspirin Other (See Comments)    Told to avoid due to stomach ulcers   Effexor [Venlafaxine] Other (See Comments)    Dysphoric mood  Lipitor [Atorvastatin] Other (See Comments)    Myalgias    Neurontin [Gabapentin] Swelling    Facial swelling   Nsaids Other (See Comments)    Told to avoid due to stomach ulcer   Zestril [Lisinopril] Cough    Review of Systems  All other systems reviewed and are negative.  Except as noted above.   OBJECTIVE:   Blood pressure (!) 140/74, pulse 74, temperature 98.4 F (36.9 C), resp. rate 18, height 5\' 3"  (1.6 m), weight 94.3 kg, SpO2 97 %. Body mass index is 36.85 kg/m.  Physical Exam Constitutional:      Appearance: Normal appearance. She is not toxic-appearing.  HENT:     Head: Normocephalic and atraumatic.     Right Ear:  Tympanic membrane, ear canal and external ear normal.     Left Ear: External ear normal.     Nose: Nose normal.  Eyes:     Extraocular Movements: Extraocular movements intact.     Conjunctiva/sclera: Conjunctivae normal.  Neck:     Comments: She is able to turn her head from side to side while talking.  Cardiovascular:     Pulses: Normal pulses.  Pulmonary:     Effort: Pulmonary effort is normal. No respiratory distress.  Abdominal:     General: There is no distension.     Palpations: Abdomen is soft.     Tenderness: There is no abdominal tenderness.  Musculoskeletal:        General: Normal range of motion.     Cervical back: Normal range of motion and neck supple.  Skin:    General: Skin is warm and dry.  Neurological:     General: No focal deficit present.     Mental Status: She is alert and oriented to person, place, and time.     Cranial Nerves: No cranial nerve deficit.  Psychiatric:        Mood and Affect: Mood normal.        Behavior: Behavior normal.      Lab Results: Lab Results  Component Value Date   WBC 7.9 11/08/2021   HGB 13.9 11/08/2021   HCT 41.0 11/08/2021   MCV 89.8 11/08/2021   PLT 397 11/08/2021    Lab Results  Component Value Date   NA 140 11/08/2021   K 3.9 11/08/2021   CO2 27 11/08/2021   GLUCOSE 99 11/08/2021   BUN 18 11/08/2021   CREATININE 1.10 (H) 11/08/2021   CALCIUM 10.6 (H) 11/08/2021   GFRNONAA 56 (L) 11/08/2021   GFRAA 96 05/19/2018    Lab Results  Component Value Date   ALT 20 04/26/2017   AST 23 04/26/2017   ALKPHOS 67 04/26/2017   BILITOT 0.5 04/26/2017    No results found for: "CRP"  No results found for: "ESRSEDRATE"  I have reviewed the micro and lab results in Epic.  Imaging: DG FL GUIDED LUMBAR PUNCTURE  Result Date: 11/09/2021 CLINICAL DATA:  Meningismus. Headache. Concern for meningitis. EXAM: DIAGNOSTIC LUMBAR PUNCTURE UNDER FLUOROSCOPIC GUIDANCE COMPARISON:  Lumbar spine MRI 04/18/2017. CT abdomen/pelvis  11/09/2021. FLUOROSCOPY: Fluoroscopy time: 12 seconds (1.0 mGy). PROCEDURE: Prior to the procedure, 11/11/2021, PA-C obtained informed consent from the patient. This process included a discussion of procedural risks. The patient was positioned prone on the fluoroscopy table. An appropriate skin entry site was determined under fluoroscopy and marked. The operator Don sterile gloves and a mask. The lower back was prepped with Betadine. 1% Lidocaine was used for local anesthesia. Lumbar puncture  was performed at the L4-L5 level (via a laminectomy defect) using a 20 gauge spinal needle. There was return of clear/colorless CSF with an opening pressure of 14 cm water. 9 mL of CSF were obtained for laboratory studies. The patient tolerated the procedure well. No immediate post-procedure complication was apparent. The procedure was performed by Brayton El PA-C, and supervised by Dr. Jackey Loge. IMPRESSION: 1. Technically successful fluoroscopically-guided L4-L5 lumbar puncture. 2. Opening pressure: 14 cm water. 3. 9 mL of CSF obtained for laboratory studies. 4. No immediate post-procedure complication. Electronically Signed   By: Jackey Loge D.O.   On: 11/09/2021 09:30   US RENAL  Result Date: 11/09/2021 CLINICAL DATA:  Renal cyst.  History of partial left nephrectomy. EXAM: RENAL / URINARY TRACT ULTRASOUND COMPLETE COMPARISON:  CT AP 11/09/2021 FINDINGS: Right Kidney: Renal measurements: 10 x 4.1 x 4.5 cm = volume: 96.9 mL. Echogenicity within normal limits. No mass or hydronephrosis visualized. Left Kidney: Renal measurements: 9.9 x 4.9 x 4.5 cm = volume: 115.04 ML. Contour deformity along the lateral aspect of the left kidney compatible with prior partial nephrectomy. No cyst identified. Echogenicity within normal limits. No mass or hydronephrosis visualized. Bladder: Appears normal for degree of bladder distention. Other: None. IMPRESSION: 1. Status post partial nephrectomy of the lateral aspect of the left  kidney. 2. No hydronephrosis or mass identified. Electronically Signed   By: Signa Kell M.D.   On: 11/09/2021 05:24   CT ABDOMEN PELVIS W CONTRAST  Result Date: 11/09/2021 CLINICAL DATA:  Abdominal pain. EXAM: CT ABDOMEN AND PELVIS WITH CONTRAST TECHNIQUE: Multidetector CT imaging of the abdomen and pelvis was performed using the standard protocol following bolus administration of intravenous contrast. RADIATION DOSE REDUCTION: This exam was performed according to the departmental dose-optimization program which includes automated exposure control, adjustment of the mA and/or kV according to patient size and/or use of iterative reconstruction technique. CONTRAST:  OMNIPAQUE IOHEXOL 300 MG/ML  SOLN COMPARISON:  Aug 08, 2012 FINDINGS: Lower chest: No acute abnormality. Hepatobiliary: An 11 mm focus of parenchymal low attenuation is seen within the posterior aspect of the right lobe of the liver. No gallstones, gallbladder wall thickening, or biliary dilatation. Pancreas: Unremarkable. No pancreatic ductal dilatation or surrounding inflammatory changes. Spleen: Normal in size without focal abnormality. Adrenals/Urinary Tract: Adrenal glands are unremarkable. Kidneys are normal in size, without obstructing renal calculi or hydronephrosis. A 5 mm nonobstructing renal calculus is seen within the mid to lower right kidney. Cortical scarring is seen along the posterolateral aspect of the mid left kidney. An adjacent 11 mm diameter focus of low attenuation (approximately 22.30 Hounsfield units) is seen. A mild amount of left posterolateral perinephric inflammatory fat stranding is noted within this region. Bladder is unremarkable. Stomach/Bowel: There is a small hiatal hernia. 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 The appendix is not identified no evidence of bowel wall thickening, distention, or inflammatory changes. Vascular/Lymphatic: Aortic  atherosclerosis. No enlarged abdominal or pelvic lymph nodes. Reproductive: Status post hysterectomy. No adnexal masses. Other: No abdominal wall hernia or abnormality. No abdominopelvic ascites. Musculoskeletal: Postoperative changes are seen at the levels of L4, L5 and S1. IMPRESSION: 1. 5 mm nonobstructing right renal calculus. 2. Left-sided renal cortical scarring, with additional findings that may represent a small, adjacent infected renal cyst. Correlation with renal ultrasound is recommended. 3. Small hiatal hernia. 4. Postoperative changes at the levels of L4, L5 and S1. 5. Aortic atherosclerosis. Aortic Atherosclerosis (  ICD10-I70.0). Electronically Signed   By: Aram Candela M.D.   On: 11/09/2021 03:33   CT Head Wo Contrast  Result Date: 11/09/2021 CLINICAL DATA:  Headaches EXAM: CT HEAD WITHOUT CONTRAST TECHNIQUE: Contiguous axial images were obtained from the base of the skull through the vertex without intravenous contrast. RADIATION DOSE REDUCTION: This exam was performed according to the departmental dose-optimization program which includes automated exposure control, adjustment of the mA and/or kV according to patient size and/or use of iterative reconstruction technique. COMPARISON:  04/26/2017 FINDINGS: Brain: No evidence of acute infarction, hemorrhage, hydrocephalus, extra-axial collection or mass lesion/mass effect. Vascular: No hyperdense vessel or unexpected calcification. Skull: Normal. Negative for fracture or focal lesion. Sinuses/Orbits: No acute finding. Other: None. IMPRESSION: No acute intracranial abnormality noted. Electronically Signed   By: Alcide Clever M.D.   On: 11/09/2021 00:08   DG Chest 2 View  Result Date: 11/08/2021 CLINICAL DATA:  Chest pain EXAM: CHEST - 2 VIEW COMPARISON:  04/26/2017 FINDINGS: The heart size and mediastinal contours are within normal limits. Both lungs are clear. The visualized skeletal structures are unremarkable. IMPRESSION: No active  cardiopulmonary disease. Electronically Signed   By: Alcide Clever M.D.   On: 11/08/2021 21:58     Imaging independently reviewed in Epic.  Vedia Coffer for Infectious Disease [facility] [phone] pager 11/09/2021, 12:52 PM

## 2021-11-09 NOTE — ED Provider Notes (Signed)
[facility] EMERGENCY DEPARTMENT Provider Note   CSN: [id number] Arrival date & time: 11/08/21  2014      History  Chief Complaint  Patient presents with   Headache   Chest Pain    Autumn Green is a 61 y.o. female.   Headache Associated symptoms: no cough   Chest Pain Associated symptoms: headache   Associated symptoms: no cough    Patient presents w/multiple complaints.  She reports she was in her usual state of health and took a morning walk.  Around 11 AM she began having generalized headache.  She reports body aches.  She then developed chest pain, shortness of breath and diffuse abdominal pain.  She reports the chest pain is worse with palpation and moving no cough/sore throat/vomiting No recent foreign travel.  No tick bites.  She reports she has never had this headache before She also reports neck pain and stiffness. No focal weakness She is not on anticoagulation.  She reports left partial nephrectomy for renal carcinoma this past summer.  She has done well postoperatively.  She is not on chemotherapy  Previous surgery also includes lumbar fusion  Home Medications Prior to Admission medications   Medication Sig Start Date End Date Taking? Authorizing Provider  azelastine (ASTELIN) 0.1 % nasal spray Place 2 sprays into both nostrils 2 (two) times daily. Use in each nostril as directed 05/19/18   Arnette Felts, FNP  cyclobenzaprine (FLEXERIL) 10 MG tablet Take 1 tablet (10 mg total) by mouth 3 (three) times daily as needed for muscle spasms. 08/05/18   Arnette Felts, FNP  cycloSPORINE (RESTASIS) 0.05 % ophthalmic emulsion Place 1 drop into both eyes 2 (two) times daily.    [provider]  diltiazem (DILACOR XR) 180 MG 24 hr capsule Take 180 mg by mouth daily.    [provider]  ferrous sulfate 325 (65 FE) MG tablet Take 325 mg by mouth 3 (three) times a week.    [provider]  hydrochlorothiazide (HYDRODIURIL) 25 MG tablet  Take 25 mg by mouth daily.    [provider]  levothyroxine (SYNTHROID, LEVOTHROID) 75 MCG tablet Take 75 mcg by mouth daily before breakfast.    [provider]  losartan (COZAAR) 100 MG tablet Take 100 mg by mouth daily. TAKE 1/2 A PILL EVERY OTHER DAY    [provider]  Multiple Vitamin (THERA) TABS Take by mouth.    [provider]  omeprazole (PRILOSEC) 20 MG capsule Take 20 mg by mouth daily.    [provider]      Allergies    Gabapentin    Review of Systems   Review of Systems  Respiratory:  Negative for cough.   Cardiovascular:  Positive for chest pain.  Neurological:  Positive for headaches.    Physical Exam Updated Vital Signs BP (!) 169/92   Pulse 82   Temp 99.1 F (37.3 C)   Resp 17   Ht 1.6 m (5\' 3" )   Wt 94.3 kg   SpO2 98%   BMI 36.85 kg/m  Physical Exam CONSTITUTIONAL: Well developed/well nourished HEAD: Normocephalic/atraumatic EYES: EOMI/PERRL, no nystagmus, no ptosis ENMT: Mucous membranes moist, uvula midline, no erythema or exudates NECK: supple no meningeal signs SPINE/BACK:entire spine nontender, scar noted from previous lumbar fusion CV: S1/S2 noted, no murmurs/rubs/gallops noted LUNGS: Lungs are clear to auscultation bilaterally, no apparent distress ABDOMEN: soft, diffuse moderate tenderness, no rebound or guarding, well-healed incisions noted over left abdomen GU:no cva  tenderness NEURO:Awake/alert, face symmetric, no arm or leg drift is noted Equal 5/5 strength with shoulder abduction Equal 5/5 strength with hip flexion,knee flex/extension, foot dorsi/plantar flexion Cranial nerves 3/4/5/6/09/17/08/11/12 tested and intact Sensation to light touch intact in all extremities EXTREMITIES: pulses normal, full ROM SKIN: warm, color normal, no rash PSYCH: no abnormalities of mood noted, alert and oriented to situation  ED Results / Procedures / Treatments   Labs (all labs ordered are listed, but only  abnormal results are displayed) Labs Reviewed  BASIC METABOLIC PANEL - Abnormal; Notable for the following components:      Result Value   Glucose, Bld 107 (*)    Creatinine, Ser 1.11 (*)    Calcium 10.6 (*)    GFR, Estimated 56 (*)    All other components within normal limits  URINALYSIS, ROUTINE W REFLEX MICROSCOPIC - Abnormal; Notable for the following components:   Hgb urine dipstick MODERATE (*)    All other components within normal limits  I-STAT CHEM 8, ED - Abnormal; Notable for the following components:   Creatinine, Ser 1.10 (*)    All other components within normal limits  SARS CORONAVIRUS 2 BY RT PCR  CULTURE, BLOOD (ROUTINE X 2)  CULTURE, BLOOD (ROUTINE X 2)  CBC WITH DIFFERENTIAL/PLATELET  BRAIN NATRIURETIC PEPTIDE  LACTIC ACID, PLASMA  LACTIC ACID, PLASMA  TROPONIN I (HIGH SENSITIVITY)  TROPONIN I (HIGH SENSITIVITY)    EKG EKG Interpretation  Date/Time:  Wednesday November 08 2021 21:27:18 EDT Ventricular Rate:  80 PR Interval:  146 QRS Duration: 82 QT Interval:  362 QTC Calculation: 417 R Axis:   56 Text Interpretation: Normal sinus rhythm Nonspecific ST abnormality Abnormal ECG When compared with ECG of 26-Apr-2017 14:28, No significant change was found Confirmed by Dione Booze ([id number]) on 11/09/2021 1:11:14 AM  Radiology CT ABDOMEN PELVIS W CONTRAST  Result Date: 11/09/2021 CLINICAL DATA:  Abdominal pain. EXAM: CT ABDOMEN AND PELVIS WITH CONTRAST TECHNIQUE: Multidetector CT imaging of the abdomen and pelvis was performed using the standard protocol following bolus administration of intravenous contrast. RADIATION DOSE REDUCTION: This exam was performed according to the departmental dose-optimization program which includes automated exposure control, adjustment of the mA and/or kV according to patient size and/or use of iterative reconstruction technique. CONTRAST:  OMNIPAQUE IOHEXOL 300 MG/ML  SOLN COMPARISON:  Aug 08, 2012 FINDINGS: Lower chest: No acute  abnormality. Hepatobiliary: An 11 mm focus of parenchymal low attenuation is seen within the posterior aspect of the right lobe of the liver. No gallstones, gallbladder wall thickening, or biliary dilatation. Pancreas: Unremarkable. No pancreatic ductal dilatation or surrounding inflammatory changes. Spleen: Normal in size without focal abnormality. Adrenals/Urinary Tract: Adrenal glands are unremarkable. Kidneys are normal in size, without obstructing renal calculi or hydronephrosis. A 5 mm nonobstructing renal calculus is seen within the mid to lower right kidney. Cortical scarring is seen along the posterolateral aspect of the mid left kidney. An adjacent 11 mm diameter focus of low attenuation (approximately 22.30 Hounsfield units) is seen. A mild amount of left posterolateral perinephric inflammatory fat stranding is noted within this region. Bladder is unremarkable. Stomach/Bowel: There is a small hiatal hernia. 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 The appendix is not identified no evidence of bowel wall thickening, distention, or inflammatory changes. Vascular/Lymphatic: Aortic atherosclerosis. No enlarged abdominal or pelvic lymph nodes. Reproductive: Status post hysterectomy. No adnexal masses. Other: No abdominal wall hernia or abnormality. No abdominopelvic ascites. Musculoskeletal: Postoperative  changes are seen at the levels of L4, L5 and S1. IMPRESSION: 1. 5 mm nonobstructing right renal calculus. 2. Left-sided renal cortical scarring, with additional findings that may represent a small, adjacent infected renal cyst. Correlation with renal ultrasound is recommended. 3. Small hiatal hernia. 4. Postoperative changes at the levels of L4, L5 and S1. 5. Aortic atherosclerosis. Aortic Atherosclerosis (ICD10-I70.0). Electronically Signed   By: Virgina Norfolk M.D.   On: 11/09/2021 03:33   CT Head Wo Contrast  Result Date:  11/09/2021 CLINICAL DATA:  Headaches EXAM: CT HEAD WITHOUT CONTRAST TECHNIQUE: Contiguous axial images were obtained from the base of the skull through the vertex without intravenous contrast. RADIATION DOSE REDUCTION: This exam was performed according to the departmental dose-optimization program which includes automated exposure control, adjustment of the mA and/or kV according to patient size and/or use of iterative reconstruction technique. COMPARISON:  04/26/2017 FINDINGS: Brain: No evidence of acute infarction, hemorrhage, hydrocephalus, extra-axial collection or mass lesion/mass effect. Vascular: No hyperdense vessel or unexpected calcification. Skull: Normal. Negative for fracture or focal lesion. Sinuses/Orbits: No acute finding. Other: None. IMPRESSION: No acute intracranial abnormality noted. Electronically Signed   By: Inez Catalina M.D.   On: 11/09/2021 00:08   DG Chest 2 View  Result Date: 11/08/2021 CLINICAL DATA:  Chest pain EXAM: CHEST - 2 VIEW COMPARISON:  04/26/2017 FINDINGS: The heart size and mediastinal contours are within normal limits. Both lungs are clear. The visualized skeletal structures are unremarkable. IMPRESSION: No active cardiopulmonary disease. Electronically Signed   By: Inez Catalina M.D.   On: 11/08/2021 21:58    Procedures Procedures    Medications Ordered in ED Medications  lactated ringers infusion (has no administration in time range)  cefTRIAXone (ROCEPHIN) 2 g in sodium chloride 0.9 % 100 mL IVPB (has no administration in time range)  fentaNYL (SUBLIMAZE) injection 100 mcg (has no administration in time range)  ondansetron (ZOFRAN) injection 4 mg (has no administration in time range)  vancomycin (VANCOREADY) IVPB 2000 mg/400 mL (has no administration in time range)  acetaminophen (TYLENOL) tablet 650 mg (650 mg Oral Given 11/08/21 2144)    ED Course/ Medical Decision Making/ A&P Clinical Course as of 11/09/21 0409  Thu Nov 09, 2021  0158 Patient with  multiple issues.  She is having severe headaches with the presence of fever.  She will need to have IV antibiotics and lumbar puncture.  Unfortunately she has had lumbar fusion which limits bedside lumbar puncture.  We will empirically treat with antibiotics.  Patient is also having diffuse abdominal tenderness with recent nephrectomy.  Will need CT abdomen pelvis [DW]  0259 Due to concern for possible intra-abdominal process, decadron was withheld [DW]  0403 CT imaging reveals possible infected renal cyst, no other acute findings.  Plan will be to admit for meningitis rule out, will also need renal ultrasound [DW]  0408 D/w Dr Alcario Drought for admission - will need meningitis rule out [DW]    Clinical Course User Index [DW] Ripley Fraise, MD                           Medical Decision Making Amount and/or Complexity of Data Reviewed Labs: ordered. Radiology: ordered.  Risk Prescription drug management. Decision regarding hospitalization.   This patient presents to the ED for concern of headache, abdominal pain, chest pain, this involves an extensive number of treatment options, and is a complaint that carries with it a high risk of complications and  morbidity.  The differential diagnosis includes but is not limited to subarachnoid hemorrhage, intracranial hemorrhage, meningitis, encephalitis, CVST, temporal arteritis, idiopathic intracranial hypertension, migraine acute coronary syndrome, aortic dissection, pulmonary embolism, pericarditis, pneumothorax, pneumonia, myocarditis, pleurisy, esophageal rupture cholecystitis, cholelithiasis, pancreatitis, gastritis, peptic ulcer disease, appendicitis, bowel obstruction, bowel perforation, diverticulitis, AAA, ischemic bowel, surgical complication    Comorbidities that complicate the patient evaluation: Patient's presentation is complicated by their history of hypertension and recent nephrectomy   Additional history obtained: Additional history  obtained from spouse Records reviewed previous admission documents  Lab Tests: I Ordered, and personally interpreted labs.  The pertinent results include: Hematuria  Imaging Studies ordered: I ordered imaging studies including CT scan head and X-ray chest   I independently visualized and interpreted imaging which showed the findings I agree with the radiologist interpretation CT Abdo pelvis reveals possible infected renal cyst  Cardiac Monitoring: The patient was maintained on a cardiac monitor.  I personally viewed and interpreted the cardiac monitor which showed an underlying rhythm of:  sinus rhythm  Medicines ordered and prescription drug management: I ordered medication including fentanyl for pain Reevaluation of the patient after these medicines showed that the patient    stayed the same  Critical Interventions:  IV antibiotics for presumed meningitis  Consultations Obtained: I requested consultation with the admitting physician Dr. Alcario Drought , and discussed  findings as well as pertinent plan - they recommend: Admission  Reevaluation: After the interventions noted above, I reevaluated the patient and found that they have :stayed the same  Complexity of problems addressed: Patient's presentation is most consistent with  acute presentation with potential threat to life or bodily function  Disposition: After consideration of the diagnostic results and the patient's response to treatment,  I feel that the patent would benefit from admission   .           Final Clinical Impression(s) / ED Diagnoses Final diagnoses:  Generalized abdominal pain  Acute febrile illness  Other headache syndrome    Rx / DC Orders ED Discharge Orders     None         Ripley Fraise, MD 11/09/21 0410

## 2021-11-09 NOTE — Progress Notes (Signed)
PHARMACY - PHYSICIAN COMMUNICATION  CRITICAL VALUE ALERT - Meningitis / Encephalitis Panel  Autumn Green is an 61 y.o. female who presented to [facility] on 11/08/2021 with a chief complaint of headache, fever, neck stiffness.  Assessment: Lumbar puncture performed for concerns of meningitis / encephalitis.   Name of physician (or Provider) Contacted: Lama ([facility]) and Earlene Plater (ID consult)  Current anti-infectives: Ampicillin + Rocephin + Vancomycin  Changes to prescribed anti-infectives recommended:  - D/c all antibiotics - Start Acyclovir per pharmacy protocol  Results for orders placed or performed during the hospital encounter of 11/08/21  Meningitis/Encephalitis Panel (CSF) (Collected: 11/09/2021 11:13 AM)  Result Value Ref Range   Cryptococcus neoformans/gattii (CSF) NOT DETECTED NOT DETECTED   Cytomegalovirus (CSF) NOT DETECTED NOT DETECTED   Enterovirus (CSF) NOT DETECTED NOT DETECTED   Escherichia coli K1 (CSF) NOT DETECTED NOT DETECTED   Haemophilus influenzae (CSF) NOT DETECTED NOT DETECTED   Herpes simplex virus 1 (CSF) NOT DETECTED NOT DETECTED   Herpes simplex virus 2 (CSF) DETECTED (A) NOT DETECTED   Human herpesvirus 6 (CSF) NOT DETECTED NOT DETECTED   Human parechovirus (CSF) NOT DETECTED NOT DETECTED   Listeria monocytogenes (CSF) NOT DETECTED NOT DETECTED   Neisseria meningitis (CSF) NOT DETECTED NOT DETECTED   Streptococcus agalactiae (CSF) NOT DETECTED NOT DETECTED   Streptococcus pneumoniae (CSF) NOT DETECTED NOT DETECTED   Varicella zoster virus (CSF) NOT DETECTED NOT DETECTED    Thank you for allowing pharmacy to be a part of this patient's care.  Georgina Pillion, PharmD, BCPS Infectious Diseases Clinical Pharmacist 11/09/2021 1:25 PM   **Pharmacist phone directory can now be found on [email or web address] ([email or web address]).  Listed under [facility].

## 2021-11-09 NOTE — Assessment & Plan Note (Signed)
Cont home BP meds when med rec complete. 

## 2021-11-10 DIAGNOSIS — B003 Herpesviral meningitis: Secondary | ICD-10-CM

## 2021-11-10 DIAGNOSIS — I10 Essential (primary) hypertension: Secondary | ICD-10-CM | POA: Diagnosis not present

## 2021-11-10 DIAGNOSIS — G4489 Other headache syndrome: Secondary | ICD-10-CM | POA: Diagnosis not present

## 2021-11-10 LAB — CBC
HCT: 38.5 % (ref 36.0–46.0)
Hemoglobin: 12.6 g/dL (ref 12.0–15.0)
MCH: 29.3 pg (ref 26.0–34.0)
MCHC: 32.7 g/dL (ref 30.0–36.0)
MCV: 89.5 fL (ref 80.0–100.0)
Platelets: 219 10*3/uL (ref 150–400)
RBC: 4.3 MIL/uL (ref 3.87–5.11)
RDW: 14 % (ref 11.5–15.5)
WBC: 7 10*3/uL (ref 4.0–10.5)
nRBC: 0 % (ref 0.0–0.2)

## 2021-11-10 LAB — BASIC METABOLIC PANEL
Anion gap: 8 (ref 5–15)
BUN: 13 mg/dL (ref 8–23)
CO2: 25 mmol/L (ref 22–32)
Calcium: 9.5 mg/dL (ref 8.9–10.3)
Chloride: 107 mmol/L (ref 98–111)
Creatinine, Ser: 0.98 mg/dL (ref 0.44–1.00)
GFR, Estimated: >60 mL/min (ref >60)
Glucose, Bld: 116 mg/dL — ABNORMAL HIGH (ref 70–99)
Potassium: 3.7 mmol/L (ref 3.5–5.1)
Sodium: 140 mmol/L (ref 135–145)

## 2021-11-10 MED ORDER — DILTIAZEM HCL ER COATED BEADS 180 MG PO CP24
180.0000 mg | ORAL_CAPSULE | Freq: Every day | ORAL | Status: DC
  Administered 2021-11-11 – 2021-11-12 (×2): 180 mg via ORAL
  Filled 2021-11-10 (×3): qty 1

## 2021-11-10 MED ORDER — TRAZODONE HCL 50 MG PO TABS
100.0000 mg | ORAL_TABLET | Freq: Every evening | ORAL | Status: DC | PRN
Start: 2021-11-10 — End: 2021-11-12

## 2021-11-10 MED ORDER — HYDROCHLOROTHIAZIDE 25 MG PO TABS
25.0000 mg | ORAL_TABLET | Freq: Every day | ORAL | Status: DC
  Administered 2021-11-10 – 2021-11-12 (×3): 25 mg via ORAL
  Filled 2021-11-10 (×3): qty 1

## 2021-11-10 MED ORDER — SERTRALINE HCL 100 MG PO TABS
100.0000 mg | ORAL_TABLET | Freq: Every day | ORAL | Status: DC
  Administered 2021-11-10 – 2021-11-11 (×2): 100 mg via ORAL
  Filled 2021-11-10 (×3): qty 1

## 2021-11-10 MED ORDER — PANTOPRAZOLE SODIUM 40 MG PO TBEC
40.0000 mg | DELAYED_RELEASE_TABLET | Freq: Every day | ORAL | Status: DC
  Administered 2021-11-10 – 2021-11-12 (×3): 40 mg via ORAL
  Filled 2021-11-10 (×3): qty 1

## 2021-11-10 MED ORDER — CYCLOSPORINE 0.05 % OP EMUL
1.0000 [drp] | Freq: Two times a day (BID) | OPHTHALMIC | Status: DC
  Administered 2021-11-11 – 2021-11-12 (×3): 1 [drp] via OPHTHALMIC
  Filled 2021-11-10 (×5): qty 30

## 2021-11-10 MED ORDER — LEVOTHYROXINE SODIUM 112 MCG PO TABS
112.0000 ug | ORAL_TABLET | Freq: Every day | ORAL | Status: DC
  Administered 2021-11-10 – 2021-11-12 (×3): 112 ug via ORAL
  Filled 2021-11-10 (×3): qty 1

## 2021-11-10 MED ORDER — LOSARTAN POTASSIUM 50 MG PO TABS
50.0000 mg | ORAL_TABLET | Freq: Every day | ORAL | Status: DC
  Administered 2021-11-10 – 2021-11-12 (×3): 50 mg via ORAL
  Filled 2021-11-10 (×3): qty 1

## 2021-11-10 NOTE — Progress Notes (Signed)
[facility]  Date of Admission:  11/08/2021           Reason for visit: Follow up on HSV-2 meningitis  Current antibiotics: Acyclovir    ASSESSMENT:    61 y.o. female admitted with:  HSV-2 meningitis: Patient presenting with symptoms consistent with meningitis without encephalitis.  Lumbar puncture was consistent with aseptic meningitis and her ME panel was positive for HSV-2 which is consistent with her overall presentation.  About 1 to 2 weeks prior to admission, she did have a painful vesicular lesion in her right ear canal that may have been herpetic and contributed to her overall presentation.  This has otherwise since resolved. Headache: Secondary to meningitis and improving.  RECOMMENDATIONS:    Continue acyclovir IV per pharmacy Maintenance fluids with IV acyclovir Lab monitoring Following. 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  Afebrile, Tmax 98.7 Started on acyclovir CSF cultures remain negative Blood cultures remain negative Labs not drawn this morning Creatinine yesterday was 1.11  She is feeling better today.  She still has a headache.  No further fevers.    Review of Systems  All other systems reviewed and are negative.     OBJECTIVE:   Blood pressure 136/76, pulse 75, temperature 98.3 F (36.8 C), temperature source Oral, resp. rate 18, height 5\' 3"  (1.6 m), weight 96 kg, SpO2 99 %. Body mass index is 37.49 kg/m.  Physical Exam Constitutional:      General: She is not in acute distress.    Appearance: She is well-developed.  HENT:     Head: Normocephalic and atraumatic.  Pulmonary:     Effort: Pulmonary effort is normal. No respiratory distress.  Musculoskeletal:     Cervical back: Normal range of motion and neck supple.  Skin:    General: Skin is warm and dry.     Findings: No rash.  Neurological:     Mental Status: She is alert and oriented to person, place, and time.     Cranial Nerves: No cranial nerve deficit.  Psychiatric:        Mood and Affect: Mood normal.        Behavior: Behavior normal.      Lab Results: Lab Results  Component Value Date   WBC 7.9 11/08/2021   HGB 13.9 11/08/2021   HCT 41.0 11/08/2021   MCV 89.8 11/08/2021   PLT 397 11/08/2021    Lab Results  Component Value Date   NA 140 11/08/2021   K 3.9 11/08/2021   CO2 27 11/08/2021   GLUCOSE 99 11/08/2021   BUN 18 11/08/2021   CREATININE 1.10 (H) 11/08/2021   CALCIUM 10.6 (H) 11/08/2021   GFRNONAA 56 (L) 11/08/2021   GFRAA 96 05/19/2018  Lab Results  Component Value Date   ALT 20 04/26/2017   AST 23 04/26/2017   ALKPHOS 67 04/26/2017   BILITOT 0.5 04/26/2017    No results found for: "CRP"  No results found for: "ESRSEDRATE"   I have reviewed the micro and lab results in Epic.  Imaging: DG FL GUIDED LUMBAR PUNCTURE  Result Date: 11/09/2021 CLINICAL DATA:  Meningismus. Headache. Concern for meningitis. EXAM: DIAGNOSTIC LUMBAR PUNCTURE UNDER FLUOROSCOPIC GUIDANCE COMPARISON:  Lumbar spine MRI 04/18/2017. CT abdomen/pelvis 11/09/2021. FLUOROSCOPY: Fluoroscopy time: 12 seconds (1.0 mGy). PROCEDURE: Prior to the procedure, Ascencion Dike, PA-C obtained informed consent from the patient. This process included a discussion of procedural risks. The patient was positioned prone on the fluoroscopy table. An appropriate skin entry site was determined under fluoroscopy and marked. The operator Don sterile gloves and a mask. The lower back was prepped with Betadine. 1% Lidocaine was used for local anesthesia. Lumbar puncture was performed at the L4-L5 level (via a laminectomy defect) using a 20 gauge spinal needle. There was return of clear/colorless CSF with an opening pressure of 14 cm water. 9 mL of CSF were obtained for laboratory studies. The patient tolerated the procedure well. No immediate post-procedure complication was apparent. The procedure was performed by Ascencion Dike PA-C, and supervised by Dr. Kellie Simmering. IMPRESSION: 1. Technically successful fluoroscopically-guided L4-L5 lumbar puncture. 2. Opening pressure: 14 cm water. 3. 9 mL of CSF obtained for laboratory studies. 4. No immediate post-procedure complication. Electronically Signed   By: Kellie Simmering D.O.   On: 11/09/2021 09:30   US RENAL  Result Date: 11/09/2021 CLINICAL DATA:  Renal cyst.  History of partial left nephrectomy. EXAM: RENAL / URINARY TRACT ULTRASOUND COMPLETE COMPARISON:  CT AP 11/09/2021 FINDINGS: Right Kidney: Renal measurements: 10 x 4.1 x 4.5 cm = volume: 96.9 mL. Echogenicity within normal limits. No mass or hydronephrosis visualized. Left Kidney: Renal measurements: 9.9 x 4.9 x 4.5 cm = volume: 115.04 ML. Contour deformity along the lateral aspect of the left kidney compatible with prior partial nephrectomy. No cyst identified. Echogenicity within normal limits. No mass or hydronephrosis visualized. Bladder: Appears normal for degree of bladder distention. Other: None. IMPRESSION: 1. Status post partial nephrectomy of the lateral aspect of the left kidney. 2. No hydronephrosis or mass identified. Electronically Signed   By: Kerby Moors M.D.   On:  11/09/2021 05:24   CT ABDOMEN PELVIS W CONTRAST  Result Date: 11/09/2021 CLINICAL DATA:  Abdominal pain. EXAM: CT ABDOMEN AND PELVIS WITH CONTRAST TECHNIQUE: Multidetector CT imaging of the abdomen and pelvis was performed using the standard protocol following bolus administration of intravenous contrast. RADIATION DOSE REDUCTION: This exam was performed according to the departmental dose-optimization program which includes automated exposure control, adjustment of the mA and/or kV according to patient size and/or use of iterative reconstruction technique. CONTRAST:  12mL OMNIPAQUE IOHEXOL 300 MG/ML  SOLN COMPARISON:  Aug 08, 2012 FINDINGS: Lower chest: No acute abnormality. Hepatobiliary: An 11 mm focus of parenchymal low attenuation is seen within the posterior aspect of the right lobe of the liver. No gallstones, gallbladder wall thickening, or biliary dilatation. Pancreas: Unremarkable. No pancreatic ductal dilatation or surrounding inflammatory changes. Spleen: Normal in size without focal abnormality. Adrenals/Urinary Tract: Adrenal glands are unremarkable. Kidneys are normal in size, without obstructing renal calculi or hydronephrosis. A 5 mm nonobstructing renal calculus is seen within the mid to lower right kidney. Cortical scarring is seen along the posterolateral aspect of the mid left kidney. An  adjacent 11 mm diameter focus of low attenuation (approximately 22.30 Hounsfield units) is seen. A mild amount of left posterolateral perinephric inflammatory fat stranding is noted within this region. Bladder is unremarkable. Stomach/Bowel: There is a small hiatal hernia. 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 The appendix is not identified no evidence of bowel wall thickening, distention, or inflammatory changes. Vascular/Lymphatic: Aortic atherosclerosis. No enlarged abdominal or pelvic lymph nodes. Reproductive: Status post hysterectomy. No  adnexal masses. Other: No abdominal wall hernia or abnormality. No abdominopelvic ascites. Musculoskeletal: Postoperative changes are seen at the levels of L4, L5 and S1. IMPRESSION: 1. 5 mm nonobstructing right renal calculus. 2. Left-sided renal cortical scarring, with additional findings that may represent a small, adjacent infected renal cyst. Correlation with renal ultrasound is recommended. 3. Small hiatal hernia. 4. Postoperative changes at the levels of L4, L5 and S1. 5. Aortic atherosclerosis. Aortic Atherosclerosis (ICD10-I70.0). Electronically Signed   By: Aram Candela M.D.   On: 11/09/2021 03:33   CT Head Wo Contrast  Result Date: 11/09/2021 CLINICAL DATA:  Headaches EXAM: CT HEAD WITHOUT CONTRAST TECHNIQUE: Contiguous axial images were obtained from the base of the skull through the vertex without intravenous contrast. RADIATION DOSE REDUCTION: This exam was performed according to the departmental dose-optimization program which includes automated exposure control, adjustment of the mA and/or kV according to patient size and/or use of iterative reconstruction technique. COMPARISON:  04/26/2017 FINDINGS: Brain: No evidence of acute infarction, hemorrhage, hydrocephalus, extra-axial collection or mass lesion/mass effect. Vascular: No hyperdense vessel or unexpected calcification. Skull: Normal. Negative for fracture or focal lesion. Sinuses/Orbits: No acute finding. Other: None. IMPRESSION: No acute intracranial abnormality noted. Electronically Signed   By: Alcide Clever M.D.   On: 11/09/2021 00:08   DG Chest 2 View  Result Date: 11/08/2021 CLINICAL DATA:  Chest pain EXAM: CHEST - 2 VIEW COMPARISON:  04/26/2017 FINDINGS: The heart size and mediastinal contours are within normal limits. Both lungs are clear. The visualized skeletal structures are unremarkable. IMPRESSION: No active cardiopulmonary disease. Electronically Signed   By: Alcide Clever M.D.   On: 11/08/2021 21:58     Imaging  independently reviewed in Epic.    Vedia Coffer for Infectious Disease [facility] [phone] pager 11/10/2021, 8:10 AM

## 2021-11-10 NOTE — Progress Notes (Signed)
[facility]  PROGRESS NOTE  Autumn Green TOI:[id number] DOB: 20-May-1960 DOA: 11/08/2021 PCP: [facility]   Brief HPI:    61 year old female with medical history of RCC status post partial left nephrectomy in June came to ED with complaints of headache, fever, body aches, stiff neck.  LP could not be done in the ED as patient has history of spine surgery/fusion.  IR was consulted for LP.  Patient underwent LP, CSF results were consistent with bacterial meningitis.  Patient started on ampicillin in addition to vancomycin and ceftriaxone.     Subjective   Patient's meningitis/encephalitis panel showed HSV-2.  Patient started on acyclovir.  IV antibiotics were discontinued.   Assessment/Plan:   Aseptic meningitis -CSF analysis was consistent with aseptic meningitis -IV antibiotics were discontinued -Meningitis/encephalitis panel was positive for HSV-2 -Patient started on IV acyclovir -Plan to complete 10 days of treatment -ID following  Hypertension -Blood pressure is elevated -We will restart home regimen including Cozaar, HCTZ, Cardizem  Hypothyroidism -Continue Synthroid  Insomnia -Continue trazodone as needed    Medications      Data Reviewed:   CBG:  No results for input(s): "GLUCAP" in the last 168 hours.  SpO2: 99 %    Vitals:   11/09/21 2145 11/10/21 0713 11/10/21 0942 11/10/21 1648  BP:   (!) 154/88 (!) 167/75  Pulse: 75  68 67  Resp: 18  18 18   Temp: 98.5 F (36.9 C) 98.3 F (36.8 C) 98.1 F (36.7 C) 98.2 F (36.8 C)  TempSrc: Oral Oral Oral Oral  SpO2: 99%  100% 99%  Weight:      Height:          Data Reviewed:  Basic Metabolic Panel: Recent Labs  Lab 11/08/21 2145 11/08/21 2221 11/10/21 0915  NA 141 140 140  K 4.0 3.9 3.7  CL 103 106 107  CO2 27  --  25  GLUCOSE 107* 99 116*  BUN 15 18 13   CREATININE 1.11* 1.10* 0.98  CALCIUM 10.6*  --  9.5    CBC: Recent Labs  Lab 11/08/21 2145 11/08/21 2221  11/10/21 0915  WBC 7.9  --  7.0  NEUTROABS 4.4  --   --   HGB 13.8 13.9 12.6  HCT 40.7 41.0 38.5  MCV 89.8  --  89.5  PLT 397  --  219    LFT No results for input(s): "AST", "ALT", "ALKPHOS", "BILITOT", "PROT", "ALBUMIN" in the last 168 hours.   Antibiotics: Anti-infectives (From admission, onward)    Start     Dose/Rate Route Frequency Ordered Stop   11/09/21 2200  acyclovir (ZOVIRAX) 700 mg in dextrose 5 % 100 mL IVPB        700 mg 114 mL/hr over 60 Minutes Intravenous Every 8 hours 11/09/21 1336 11/19/21 2359   11/09/21 1600  vancomycin (VANCOREADY) IVPB 750 mg/150 mL  Status:  Discontinued        750 mg 150 mL/hr over 60 Minutes Intravenous Every 12 hours 11/09/21 0613 11/09/21 1323   11/09/21 1400  cefTRIAXone (ROCEPHIN) 2 g in sodium chloride 0.9 % 100 mL IVPB  Status:  Discontinued        2 g 200 mL/hr over 30 Minutes Intravenous Every 12 hours 11/09/21 0602 11/09/21 1323   11/09/21 1400  acyclovir (ZOVIRAX) 700 mg in dextrose 5 % 100 mL IVPB        700 mg 114 mL/hr over 60 Minutes Intravenous  Once 11/09/21 1323 11/09/21 2012  11/09/21 1100  ampicillin (OMNIPEN) 2 g in sodium chloride 0.9 % 100 mL IVPB  Status:  Discontinued        2 g 300 mL/hr over 20 Minutes Intravenous Every 4 hours 11/09/21 1039 11/09/21 1323   11/09/21 0245  vancomycin (VANCOREADY) IVPB 2000 mg/400 mL        2,000 mg 200 mL/hr over 120 Minutes Intravenous  Once 11/09/21 0230 11/09/21 0539   11/09/21 0200  cefTRIAXone (ROCEPHIN) 2 g in sodium chloride 0.9 % 100 mL IVPB        2 g 200 mL/hr over 30 Minutes Intravenous  Once 11/09/21 0156 11/09/21 0328   11/09/21 0200  vancomycin (VANCOCIN) IVPB 1000 mg/200 mL premix  Status:  Discontinued        1,000 mg 200 mL/hr over 60 Minutes Intravenous  Once 11/09/21 0156 11/09/21 0230        DVT prophylaxis: SCDs  Code Status: Full code  Family Communication: No family at bedside   CONSULTS    Objective    Physical  Examination:   General-appears in no acute distress Heart-S1-S2, regular, no murmur auscultated Lungs-clear to auscultation bilaterally, no wheezing or crackles auscultated Abdomen-soft, nontender, no organomegaly Extremities-no edema in the lower extremities Neuro-alert, oriented x3, no focal deficit noted  Status is: Inpatient:             Meredeth Ide   [facility] If 7PM-7AM, please contact night-coverage at [email or web address], Office  [phone]   11/10/2021, 5:14 PM  LOS: 1 day

## 2021-11-11 DIAGNOSIS — B003 Herpesviral meningitis: Secondary | ICD-10-CM | POA: Diagnosis not present

## 2021-11-11 DIAGNOSIS — G4489 Other headache syndrome: Secondary | ICD-10-CM | POA: Diagnosis not present

## 2021-11-11 LAB — PATHOLOGIST SMEAR REVIEW

## 2021-11-11 NOTE — Plan of Care (Signed)
  Problem: Health Behavior/Discharge Planning: Goal: Ability to manage health-related needs will improve Outcome: Progressing   Problem: Clinical Measurements: Goal: Ability to maintain clinical measurements within normal limits will improve Outcome: Progressing   Problem: Clinical Measurements: Goal: Diagnostic test results will improve Outcome: Progressing   Problem: Nutrition: Goal: Adequate nutrition will be maintained Outcome: Progressing   Problem: Coping: Goal: Level of anxiety will decrease Outcome: Progressing

## 2021-11-11 NOTE — Progress Notes (Signed)
[facility]  PROGRESS NOTE  ALLYSHA TRYON LOV:[id number] DOB: 07/10/1960 DOA: 11/08/2021 PCP: [facility]   Brief HPI:    61 year old female with medical history of RCC status post partial left nephrectomy in June came to ED with complaints of headache, fever, body aches, stiff neck.  LP could not be done in the ED as patient has history of spine surgery/fusion.  IR was consulted for LP.  Patient underwent LP, CSF results were consistent with bacterial meningitis.  Patient started on ampicillin in addition to vancomycin and ceftriaxone.     Subjective   Patient seen and examined, denies headache.  No neck pain.  Denies fever.   Assessment/Plan:   Aseptic meningitis -CSF analysis was consistent with aseptic meningitis -IV antibiotics were discontinued -Meningitis/encephalitis panel was positive for HSV-2 -Patient started on IV acyclovir -Plan to complete 10 days of treatment -ID following  Hypertension -Blood pressure was elevated; improved after starting home medications -Continue Cozaar, HCTZ, Cardizem  Hypothyroidism -Continue Synthroid  Insomnia -Continue trazodone as needed    Medications     cycloSPORINE  1 drop Both Eyes BID   diltiazem  180 mg Oral Daily   hydrochlorothiazide  25 mg Oral Daily   levothyroxine  112 mcg Oral Daily   losartan  50 mg Oral Daily   pantoprazole  40 mg Oral Daily   sertraline  100 mg Oral Daily     Data Reviewed:   CBG:  No results for input(s): "GLUCAP" in the last 168 hours.  SpO2: 96 %    Vitals:   11/10/21 1648 11/10/21 2244 11/11/21 0625 11/11/21 0807  BP: (!) 167/75 (!) 158/86 (!) 167/77 (!) 151/63  Pulse: 67 77 62 65  Resp: 18 18 15 17   Temp: 98.2 F (36.8 C) 98.1 F (36.7 C) 98.2 F (36.8 C) 98 F (36.7 C)  TempSrc: Oral  Oral Oral  SpO2: 99% 94% 92% 96%  Weight:  94.8 kg    Height:          Data Reviewed:  Basic Metabolic Panel: Recent Labs  Lab 11/08/21 2145 11/08/21 2221  11/10/21 0915  NA 141 140 140  K 4.0 3.9 3.7  CL 103 106 107  CO2 27  --  25  GLUCOSE 107* 99 116*  BUN 15 18 13   CREATININE 1.11* 1.10* 0.98  CALCIUM 10.6*  --  9.5    CBC: Recent Labs  Lab 11/08/21 2145 11/08/21 2221 11/10/21 0915  WBC 7.9  --  7.0  NEUTROABS 4.4  --   --   HGB 13.8 13.9 12.6  HCT 40.7 41.0 38.5  MCV 89.8  --  89.5  PLT 397  --  219    LFT No results for input(s): "AST", "ALT", "ALKPHOS", "BILITOT", "PROT", "ALBUMIN" in the last 168 hours.   Antibiotics: Anti-infectives (From admission, onward)    Start     Dose/Rate Route Frequency Ordered Stop   11/09/21 2200  acyclovir (ZOVIRAX) 700 mg in dextrose 5 % 100 mL IVPB        700 mg 114 mL/hr over 60 Minutes Intravenous Every 8 hours 11/09/21 1336 11/19/21 2359   11/09/21 1600  vancomycin (VANCOREADY) IVPB 750 mg/150 mL  Status:  Discontinued        750 mg 150 mL/hr over 60 Minutes Intravenous Every 12 hours 11/09/21 0613 11/09/21 1323   11/09/21 1400  cefTRIAXone (ROCEPHIN) 2 g in sodium chloride 0.9 % 100 mL IVPB  Status:  Discontinued  2 g 200 mL/hr over 30 Minutes Intravenous Every 12 hours 11/09/21 0602 11/09/21 1323   11/09/21 1400  acyclovir (ZOVIRAX) 700 mg in dextrose 5 % 100 mL IVPB        700 mg 114 mL/hr over 60 Minutes Intravenous  Once 11/09/21 1323 11/09/21 2012   11/09/21 1100  ampicillin (OMNIPEN) 2 g in sodium chloride 0.9 % 100 mL IVPB  Status:  Discontinued        2 g 300 mL/hr over 20 Minutes Intravenous Every 4 hours 11/09/21 1039 11/09/21 1323   11/09/21 0245  vancomycin (VANCOREADY) IVPB 2000 mg/400 mL        2,000 mg 200 mL/hr over 120 Minutes Intravenous  Once 11/09/21 0230 11/09/21 0539   11/09/21 0200  cefTRIAXone (ROCEPHIN) 2 g in sodium chloride 0.9 % 100 mL IVPB        2 g 200 mL/hr over 30 Minutes Intravenous  Once 11/09/21 0156 11/09/21 0328   11/09/21 0200  vancomycin (VANCOCIN) IVPB 1000 mg/200 mL premix  Status:  Discontinued        1,000 mg 200 mL/hr  over 60 Minutes Intravenous  Once 11/09/21 0156 11/09/21 0230        DVT prophylaxis: SCDs  Code Status: Full code  Family Communication: No family at bedside   CONSULTS    Objective    Physical Examination:   General-appears in no acute distress Heart-S1-S2, regular, no murmur auscultated Lungs-clear to auscultation bilaterally, no wheezing or crackles auscultated Abdomen-soft, nontender, no organomegaly Extremities-no edema in the lower extremities Neuro-alert, oriented x3, no focal deficit noted  Status is: Inpatient:             Meredeth Ide   [facility] If 7PM-7AM, please contact night-coverage at [email or web address], Office  [phone]   11/11/2021, 8:22 AM  LOS: 2 days

## 2021-11-12 DIAGNOSIS — R1084 Generalized abdominal pain: Secondary | ICD-10-CM

## 2021-11-12 DIAGNOSIS — I10 Essential (primary) hypertension: Secondary | ICD-10-CM | POA: Diagnosis not present

## 2021-11-12 DIAGNOSIS — R509 Fever, unspecified: Secondary | ICD-10-CM | POA: Diagnosis not present

## 2021-11-12 DIAGNOSIS — B003 Herpesviral meningitis: Secondary | ICD-10-CM | POA: Diagnosis not present

## 2021-11-12 LAB — BASIC METABOLIC PANEL
Anion gap: 8 (ref 5–15)
BUN: 9 mg/dL (ref 8–23)
CO2: 25 mmol/L (ref 22–32)
Calcium: 9.5 mg/dL (ref 8.9–10.3)
Chloride: 106 mmol/L (ref 98–111)
Creatinine, Ser: 0.89 mg/dL (ref 0.44–1.00)
GFR, Estimated: >60 mL/min (ref >60)
Glucose, Bld: 97 mg/dL (ref 70–99)
Potassium: 3.7 mmol/L (ref 3.5–5.1)
Sodium: 139 mmol/L (ref 135–145)

## 2021-11-12 LAB — CSF CULTURE W GRAM STAIN: Culture: NO GROWTH

## 2021-11-12 MED ORDER — VALACYCLOVIR HCL 1 G PO TABS: 1000.0000 mg | ORAL_TABLET | Freq: Three times a day (TID) | ORAL | 0 refills | Status: AC

## 2021-11-12 NOTE — Progress Notes (Signed)
DISCHARGE NOTE HOME Autumn Green to be discharged Home per MD order. Discussed prescriptions and follow up appointments with the patient. Prescriptions given to patient; medication list explained in detail. Patient verbalized understanding.  Skin clean, dry and intact without evidence of skin break down, no evidence of skin tears noted. IV catheter discontinued intact. Site without signs and symptoms of complications. Dressing and pressure applied. Pt denies pain at the site currently. No complaints noted.  Patient free of lines, drains, and wounds.   An After Visit Summary (AVS) was printed and given to the patient. Patient escorted via wheelchair, and discharged home via private auto.  Myrtis Hopping, RN

## 2021-11-12 NOTE — Discharge Summary (Signed)
Physician Discharge Summary   Patient: Autumn Green MRN: [id number] DOB: 07-May-1960  Admit date:     11/08/2021  Discharge date: 11/12/21  Discharge Physician: Meredeth IdeGagan S Shajuan Musso   PCP: [facility]   Recommendations at discharge:   Follow-up PCP in 1 week to check BMP Continue Valtrex 1 g p.o. 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 *  Hospital Course:  61 year old female with medical history of RCC status post partial left nephrectomy in June came to ED with complaints of headache, fever, body aches, stiff neck.  LP could not be done in the ED as patient has history of spine surgery/fusion.  IR was consulted for LP.  Patient underwent LP, CSF results were consistent with bacterial meningitis.  Patient started on ampicillin in addition to vancomycin and ceftriaxone.  Assessment and Plan:   Aseptic meningitis -CSF analysis was consistent with aseptic meningitis -IV antibiotics were discontinued -Meningitis/encephalitis panel was positive for HSV-2 -Patient started on IV acyclovir -Plan to complete 10 days of treatment -We will discharge on Valtrex 1 g p.o. 3 times daily for 7 more days till 11/19/2021 as per ID recommendation -Renal function checked today is normal, creatinine 0.89 -Recommend to follow-up PCP in 1 week to check BMP as outpatient   Hypertension -Blood pressure was elevated; improved after starting home medications -Continue Cozaar, HCTZ, Cardizem   Hypothyroidism -Continue Synthroid   Insomnia -Continue trazodone as needed         Consultants: Infectious disease Procedures performed: Lumbar puncture Disposition: Home Diet recommendation:  Discharge Diet Orders (From admission, onward)     Start     Ordered   11/12/21 0000  Diet - low sodium heart healthy         11/12/21 1141           Regular diet DISCHARGE MEDICATION: Allergies as of 11/12/2021       Reactions   Aspirin Other (See Comments)   Told to avoid due to stomach ulcers   Effexor [venlafaxine] Other (See Comments)   Dysphoric mood   Lipitor [atorvastatin] Other (See Comments)   Myalgias    Neurontin [gabapentin] Swelling   Facial swelling   Nsaids Other (See Comments)   Told to avoid due to stomach ulcer   Zestril [lisinopril] Cough        Medication List     TAKE these medications    acetaminophen 500 MG tablet Commonly known as: TYLENOL Take 1,000 mg by mouth 3 (three) times daily as needed for headache, fever or mild pain.   cyclobenzaprine 10 MG tablet Commonly known as: FLEXERIL Take 1 tablet (10 mg total) by mouth 3 (three) times daily as needed for muscle spasms.   diltiazem 180 MG 24 hr capsule Commonly known as: DILACOR XR Take 180 mg by mouth daily.   hydrochlorothiazide 25 MG tablet Commonly known as: HYDRODIURIL Take 25 mg by mouth daily.   levothyroxine 112 MCG tablet Commonly known as: SYNTHROID Take 112 mcg by mouth daily.   losartan 100 MG tablet Commonly known as: COZAAR Take 50 mg by mouth daily.   meloxicam 15 MG tablet Commonly known as: MOBIC Take 15 mg by mouth daily as needed for pain.   multivitamin tablet Take 1 tablet by mouth daily.   pantoprazole 40 MG  tablet Commonly known as: PROTONIX Take 40 mg by mouth daily.   Praluent 75 MG/ML Soaj Generic drug: Alirocumab Inject 75 mg into the skin every 14 (fourteen) days.   Restasis 0.05 % ophthalmic emulsion Generic drug: cycloSPORINE Place 1 drop into both eyes 2 (two) times daily.   sertraline 100 MG tablet Commonly known as: ZOLOFT Take 100 mg by mouth daily.   Systane Balance 0.6 % Soln Generic drug: Propylene Glycol Place 1 drop into both eyes 4 (four) times daily as needed (dry eyes).   traZODone 100 MG tablet Commonly known as: DESYREL Take 100 mg  by mouth at bedtime as needed for sleep.   valACYclovir 1000 MG tablet Commonly known as: Valtrex Take 1 tablet (1,000 mg total) by mouth 3 (three) times daily for 7 days.   VITAMIN C PO Take 1 tablet by mouth daily.   Vitamin D3 50 MCG (2000 UT) capsule Take 2,000 Units by mouth daily.   ZINC PO Take 1 tablet by mouth daily.        Discharge Exam: Filed Weights   11/09/21 0200 11/09/21 1500 11/10/21 2244  Weight: 94.3 kg 96 kg 94.8 kg   General-appears in no acute distress Heart-S1-S2, regular, no murmur auscultated Lungs-clear to auscultation bilaterally, no wheezing or crackles auscultated Abdomen-soft, nontender, no organomegaly Extremities-no edema in the lower extremities Neuro-alert, oriented x3, no focal deficit noted  Condition at discharge: good  The results of significant diagnostics from this hospitalization (including imaging, microbiology, ancillary and laboratory) are listed below for reference.   Imaging Studies: DG FL GUIDED LUMBAR PUNCTURE  Result Date: 11/09/2021 CLINICAL DATA:  Meningismus. Headache. Concern for meningitis. EXAM: DIAGNOSTIC LUMBAR PUNCTURE UNDER FLUOROSCOPIC GUIDANCE COMPARISON:  Lumbar spine MRI 04/18/2017. CT abdomen/pelvis 11/09/2021. FLUOROSCOPY: Fluoroscopy time: 12 seconds (1.0 mGy). PROCEDURE: Prior to the procedure, Brayton El, PA-C obtained informed consent from the patient. This process included a discussion of procedural risks. The patient was positioned prone on the fluoroscopy table. An appropriate skin entry site was determined under fluoroscopy and marked. The operator Don sterile gloves and a mask. The lower back was prepped with Betadine. 1% Lidocaine was used for local anesthesia. Lumbar puncture was performed at the L4-L5 level (via a laminectomy defect) using a 20 gauge spinal needle. There was return of clear/colorless CSF with an opening pressure of 14 cm water. 9 mL of CSF were obtained for laboratory studies. The  patient tolerated the procedure well. No immediate post-procedure complication was apparent. The procedure was performed by Brayton El PA-C, and supervised by Dr. Jackey Loge. IMPRESSION: 1. Technically successful fluoroscopically-guided L4-L5 lumbar puncture. 2. Opening pressure: 14 cm water. 3. 9 mL of CSF obtained for laboratory studies. 4. No immediate post-procedure complication. Electronically Signed   By: Jackey Loge D.O.   On: 11/09/2021 09:30   US RENAL  Result Date: 11/09/2021 CLINICAL DATA:  Renal cyst.  History of partial left nephrectomy. EXAM: RENAL / URINARY TRACT ULTRASOUND COMPLETE COMPARISON:  CT AP 11/09/2021 FINDINGS: Right Kidney: Renal measurements: 10 x 4.1 x 4.5 cm = volume: 96.9 mL. Echogenicity within normal limits. No mass or hydronephrosis visualized. Left Kidney: Renal measurements: 9.9 x 4.9 x 4.5 cm = volume: 115.04 ML. Contour deformity along the lateral aspect of the left kidney compatible with prior partial nephrectomy. No cyst identified. Echogenicity within normal limits. No mass or hydronephrosis visualized. Bladder: Appears normal for degree of bladder distention. Other: None. IMPRESSION: 1. Status post partial nephrectomy of the lateral aspect of the  left kidney. 2. No hydronephrosis or mass identified. Electronically Signed   By: Signa Kell M.D.   On: 11/09/2021 05:24   CT ABDOMEN PELVIS W CONTRAST  Result Date: 11/09/2021 CLINICAL DATA:  Abdominal pain. EXAM: CT ABDOMEN AND PELVIS WITH CONTRAST TECHNIQUE: Multidetector CT imaging of the abdomen and pelvis was performed using the standard protocol following bolus administration of intravenous contrast. RADIATION DOSE REDUCTION: This exam was performed according to the departmental dose-optimization program which includes automated exposure control, adjustment of the mA and/or kV according to patient size and/or use of iterative reconstruction technique. CONTRAST:  OMNIPAQUE IOHEXOL 300 MG/ML  SOLN  COMPARISON:  Aug 08, 2012 FINDINGS: Lower chest: No acute abnormality. Hepatobiliary: An 11 mm focus of parenchymal low attenuation is seen within the posterior aspect of the right lobe of the liver. No gallstones, gallbladder wall thickening, or biliary dilatation. Pancreas: Unremarkable. No pancreatic ductal dilatation or surrounding inflammatory changes. Spleen: Normal in size without focal abnormality. Adrenals/Urinary Tract: Adrenal glands are unremarkable. Kidneys are normal in size, without obstructing renal calculi or hydronephrosis. A 5 mm nonobstructing renal calculus is seen within the mid to lower right kidney. Cortical scarring is seen along the posterolateral aspect of the mid left kidney. An adjacent 11 mm diameter focus of low attenuation (approximately 22.30 Hounsfield units) is seen. A mild amount of left posterolateral perinephric inflammatory fat stranding is noted within this region. Bladder is unremarkable. Stomach/Bowel: There is a small hiatal hernia. 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 The appendix is not identified no evidence of bowel wall thickening, distention, or inflammatory changes. Vascular/Lymphatic: Aortic atherosclerosis. No enlarged abdominal or pelvic lymph nodes. Reproductive: Status post hysterectomy. No adnexal masses. Other: No abdominal wall hernia or abnormality. No abdominopelvic ascites. Musculoskeletal: Postoperative changes are seen at the levels of L4, L5 and S1. IMPRESSION: 1. 5 mm nonobstructing right renal calculus. 2. Left-sided renal cortical scarring, with additional findings that may represent a small, adjacent infected renal cyst. Correlation with renal ultrasound is recommended. 3. Small hiatal hernia. 4. Postoperative changes at the levels of L4, L5 and S1. 5. Aortic atherosclerosis. Aortic Atherosclerosis (ICD10-I70.0). Electronically Signed   By: Aram Candela M.D.   On: 11/09/2021  03:33   CT Head Wo Contrast  Result Date: 11/09/2021 CLINICAL DATA:  Headaches EXAM: CT HEAD WITHOUT CONTRAST TECHNIQUE: Contiguous axial images were obtained from the base of the skull through the vertex without intravenous contrast. RADIATION DOSE REDUCTION: This exam was performed according to the departmental dose-optimization program which includes automated exposure control, adjustment of the mA and/or kV according to patient size and/or use of iterative reconstruction technique. COMPARISON:  04/26/2017 FINDINGS: Brain: No evidence of acute infarction, hemorrhage, hydrocephalus, extra-axial collection or mass lesion/mass effect. Vascular: No hyperdense vessel or unexpected calcification. Skull: Normal. Negative for fracture or focal lesion. Sinuses/Orbits: No acute finding. Other: None. IMPRESSION: No acute intracranial abnormality noted. Electronically Signed   By: Alcide Clever M.D.   On: 11/09/2021 00:08   DG Chest 2 View  Result Date: 11/08/2021 CLINICAL DATA:  Chest pain EXAM: CHEST - 2 VIEW COMPARISON:  04/26/2017 FINDINGS: The heart size and mediastinal contours are within normal limits. Both lungs are clear. The visualized skeletal structures are unremarkable. IMPRESSION: No active cardiopulmonary disease. Electronically Signed   By: Alcide Clever M.D.   On: 11/08/2021 21:58    Microbiology: Results for orders placed or performed during the hospital encounter of 11/08/21  SARS Coronavirus 2 by RT PCR (hospital order, performed in [facility] hospital lab) *cepheid single result test* Anterior Nasal Swab     Status: None   Collection Time: 11/09/21  2:30 AM   Specimen: Anterior Nasal Swab  Result Value Ref Range Status   SARS Coronavirus 2 by RT PCR NEGATIVE NEGATIVE Final    Comment: (NOTE) SARS-CoV-2 target nucleic acids are NOT DETECTED.  The SARS-CoV-2 RNA is generally detectable in upper and lower respiratory specimens during the acute phase of infection. The  lowest concentration of SARS-CoV-2 viral copies this assay can detect is 250 copies / mL. A negative result does not preclude SARS-CoV-2 infection and should not be used as the sole basis for treatment or other patient management decisions.  A negative result may occur with improper specimen collection / handling, submission of specimen other than nasopharyngeal swab, presence of viral mutation(s) within the areas targeted by this assay, and inadequate number of viral copies (<250 copies / mL). A negative result must be combined with clinical observations, patient history, and epidemiological information.  Fact Sheet for Patients:   RoadLapTop.co.za  Fact Sheet for Healthcare Providers: http://kim-miller.com/  This test is not yet approved or  cleared by the [place] FDA and has been authorized for detection and/or diagnosis of SARS-CoV-2 by FDA under an Emergency Use Authorization (EUA).  This EUA will remain in effect (meaning this test can be used) for the duration of the COVID-19 declaration under Section 564(b)(1) of the Act, 21 U.S.C. section 360bbb-3(b)(1), unless the authorization is terminated or revoked sooner.  Performed at [facility], [address] [place], [place] [address]   Blood culture (routine x 2)     Status: None (Preliminary result)   Collection Time: 11/09/21  2:30 AM   Specimen: BLOOD  Result Value Ref Range Status   Specimen Description BLOOD RIGHT ANTECUBITAL  Final   Special Requests   Final    BOTTLES DRAWN AEROBIC AND ANAEROBIC Blood Culture adequate volume   Culture   Final    NO GROWTH 3 DAYS Performed at [facility], [address], [place], [place] [address]    Report Status PENDING  Incomplete  Blood culture (routine x 2)     Status: None (Preliminary result)   Collection Time: 11/09/21  2:30 AM   Specimen: BLOOD  Result Value Ref Range Status   Specimen Description BLOOD  SITE NOT SPECIFIED  Final   Special Requests   Final    BOTTLES DRAWN AEROBIC AND ANAEROBIC Blood Culture adequate volume   Culture   Final    NO GROWTH 3 DAYS Performed at [facility], [address], [place], [place] [address]    Report Status PENDING  Incomplete  CSF culture w Gram Stain     Status: None   Collection Time: 11/09/21  9:08 AM   Specimen: PATH Cytology CSF; Cerebrospinal Fluid  Result Value Ref Range Status   Specimen Description CSF  Final   Special Requests NONE  Final   Gram Stain   Final    WBC PRESENT, PREDOMINANTLY MONONUCLEAR NO ORGANISMS SEEN CYTOSPIN SMEAR    Culture   Final    NO GROWTH Performed at [facility], [address] [place], [place] [address]    Report Status 11/12/2021 FINAL  Final    Labs: CBC: Recent Labs  Lab 11/08/21 2145 11/08/21 2221 11/10/21 0915  WBC 7.9  --  7.0  NEUTROABS  4.4  --   --   HGB 13.8 13.9 12.6  HCT 40.7 41.0 38.5  MCV 89.8  --  89.5  PLT 397  --  219   Basic Metabolic Panel: Recent Labs  Lab 11/08/21 2145 11/08/21 2221 11/10/21 0915 11/12/21 0931  NA 141 140 140 139  K 4.0 3.9 3.7 3.7  CL 103 106 107 106  CO2 27  --  25 25  GLUCOSE 107* 99 116* 97  BUN 15 18 13 9   CREATININE 1.11* 1.10* 0.98 0.89  CALCIUM 10.6*  --  9.5 9.5   Liver Function Tests: No results for input(s): "AST", "ALT", "ALKPHOS", "BILITOT", "PROT", "ALBUMIN" in the last 168 hours. CBG: No results for input(s): "GLUCAP" in the last 168 hours.  Discharge time spent: greater than 30 minutes.  Signed: , MD [facility] 11/12/2021

## 2021-11-12 NOTE — Plan of Care (Signed)

## 2021-11-12 NOTE — Progress Notes (Signed)
Pharmacy Antibiotic Note  Autumn Green is a 61 y.o. 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  Pharmacy has been consulted for Acyclovir dosing.  SCr ~1 - will monitor closely on Acyclovir. Estimated CrCl>50 ml/min. Will ensure MIVF provide hydration and prevent nephrotoxicity.  BMI~36, will utilize AdjBW for Acyclovir dosing.  Plan: - Cont Acyclovir 700 mg (~10 mg/kg AdjBW) every 8 hours - LR @ 125 cc/hr to provide adequate hydration and prevent nephrotoxicity - Will monitor renal function closely for any necessary dose adjustments  Height: 5\' 3"  (160 cm) Weight: 94.8 kg (208 lb 15.9 oz) IBW/kg (Calculated) : 52.4  Temp (24hrs), Avg:98.3 F (36.8 C), Min:98 F (36.7 C), Max:98.5 F (36.9 C)  Recent Labs  Lab 11/08/21 2145 11/08/21 2221 11/09/21 0230 11/09/21 0346 11/10/21 0915  WBC 7.9  --   --   --  7.0  CREATININE 1.11* 1.10*  --   --  0.98  LATICACIDVEN  --   --  0.9 1.1  --      Estimated Creatinine Clearance: 64.4 mL/min (by C-G formula based on SCr of 0.98 mg/dL).    Allergies  Allergen Reactions   Aspirin Other (See Comments)    Told to avoid due to stomach ulcers   Effexor [Venlafaxine] Other (See Comments)    Dysphoric mood   Lipitor [Atorvastatin] Other (See Comments)    Myalgias    Neurontin [Gabapentin] Swelling    Facial swelling   Nsaids Other (See Comments)    Told to avoid due to stomach ulcer   Zestril [Lisinopril] Cough    Antimicrobials this admission: Vancomycin 8/31 x 1 Rocephin 8/31 x 1 Ampicillin 8/31 x 1 Acyclovir 8/31 >>9/10  Dose adjustments this admission: N/a  Microbiology results: 8/31 COVID >> neg 8/31 BCx >> ng<12h 8/31 CSF cx >> pending 8/31 Meningitis/Encephalitis panel >> +HSV2  9/31, PharmD, BCIDP, AAHIVP, CPP Infectious Disease Pharmacist 11/12/2021 10:33 AM

## 2021-11-14 LAB — CULTURE, BLOOD (ROUTINE X 2)
Culture: NO GROWTH
Culture: NO GROWTH
Special Requests: ADEQUATE
Special Requests: ADEQUATE

## 2022-03-12 DIAGNOSIS — C649 Malignant neoplasm of unspecified kidney, except renal pelvis: Secondary | ICD-10-CM

## 2022-03-12 HISTORY — DX: Malignant neoplasm of unspecified kidney, except renal pelvis: C64.9

## 2022-11-08 ENCOUNTER — Encounter: Payer: Self-pay | Admitting: Plastic Surgery

## 2022-11-08 ENCOUNTER — Ambulatory Visit (INDEPENDENT_AMBULATORY_CARE_PROVIDER_SITE_OTHER): Payer: No Typology Code available for payment source | Admitting: Plastic Surgery

## 2022-11-08 VITALS — BP 150/80 | HR 80 | Ht 63.0 in | Wt 208.2 lb

## 2022-11-08 DIAGNOSIS — L989 Disorder of the skin and subcutaneous tissue, unspecified: Secondary | ICD-10-CM | POA: Diagnosis not present

## 2022-11-08 NOTE — Progress Notes (Signed)
Referring Provider Albina Billet, DO [address] [place],  [place] [address]   CC:  Chief Complaint  Patient presents with   Consult           Autumn Green is an 62 y.o. female.  HPI: Autumn Green is a very pleasant 62 year old female who presents today with complaints of a small skin tag at the lateral canthus of the lower lid.  She states that the lesion has been there for 2 to 3 months.  She does not recall any trauma to the area or any other particular changes.  She does not have any pain or drainage from the area  Allergies  Allergen Reactions   Aspirin Other (See Comments)    Told to avoid due to stomach ulcers   Effexor [Venlafaxine] Other (See Comments)    Dysphoric mood   Lipitor [Atorvastatin] Other (See Comments)    Myalgias    Neurontin [Gabapentin] Swelling    Facial swelling   Nsaids Other (See Comments)    Told to avoid due to stomach ulcer   Zestril [Lisinopril] Cough    Outpatient Encounter Medications as of 11/08/2022  Medication Sig   acetaminophen (TYLENOL) 500 MG tablet Take 1,000 mg by mouth 3 (three) times daily as needed for headache, fever or mild pain.   Ascorbic Acid (VITAMIN C PO) Take 1 tablet by mouth daily.   Cholecalciferol (VITAMIN D3) 50 MCG (2000 UT) capsule Take 2,000 Units by mouth daily.   cyclobenzaprine (FLEXERIL) 10 MG tablet Take 1 tablet (10 mg total) by mouth 3 (three) times daily as needed for muscle spasms.   cycloSPORINE (RESTASIS) 0.05 % ophthalmic emulsion Place 1 drop into both eyes 2 (two) times daily.   diltiazem (DILACOR XR) 180 MG 24 hr capsule Take 180 mg by mouth daily.   hydrochlorothiazide (HYDRODIURIL) 25 MG tablet Take 25 mg by mouth daily.   levothyroxine (SYNTHROID) 112 MCG tablet Take 112 mcg by mouth daily.   losartan (COZAAR) 100 MG tablet Take 50 mg by mouth daily.   meloxicam (MOBIC) 15 MG tablet Take 15 mg by mouth daily as needed for pain.   Multiple Vitamin (MULTIVITAMIN) tablet Take 1 tablet by  mouth daily.   Multiple Vitamins-Minerals (ZINC PO) Take 1 tablet by mouth daily.   pantoprazole (PROTONIX) 40 MG tablet Take 40 mg by mouth daily.   PRALUENT 75 MG/ML SOAJ Inject 75 mg into the skin every 14 (fourteen) days.   sertraline (ZOLOFT) 100 MG tablet Take 100 mg by mouth daily.   SYSTANE BALANCE 0.6 % SOLN Place 1 drop into both eyes 4 (four) times daily as needed (dry eyes).   traZODone (DESYREL) 100 MG tablet Take 100 mg by mouth at bedtime as needed for sleep.   No facility-administered encounter medications on file as of 11/08/2022.     Past Medical History:  Diagnosis Date   High cholesterol    Hyperlipidemia 04/17/2016   Hypertension    about 10 years, well controlled with meds   Hypothyroidism    about 5 years on oral meds   Spondylosis of lumbar joint    Tuberculosis    1983-1985, no recurrance    Past Surgical History:  Procedure Laterality Date   ABDOMINAL HYSTERECTOMY     2004   BACK SURGERY     2009 dicsectomy   BREAST SURGERY     had 5 or 6 cyst removed from breast, starting 1979   LEFT HEART CATHETERIZATION WITH CORONARY  ANGIOGRAM N/A 10/14/2012   Procedure: LEFT HEART CATHETERIZATION WITH CORONARY ANGIOGRAM;  Surgeon: Pamella Pert, MD;  Location: [facility] CATH LAB;  Service: Cardiovascular;  Laterality: N/A;   LUMBAR DISC SURGERY  07/17/2011   decompression    Family History  Problem Relation Age of Onset   Colon cancer Mother    Heart attack Father    Bone cancer Brother    Thyroid disease Maternal Grandmother    Thyroid disease Maternal Aunt    Thyroid disease Maternal Uncle    Heart attack Brother    Stroke Brother    Hypotension Neg Hx    Malignant hyperthermia Neg Hx    Pseudochol deficiency Neg Hx     Social History   Social History Narrative   Not on file     Review of Systems General: Denies fevers, chills, weight loss CV: Denies chest pain, shortness of breath, palpitations Skin: 2 mm lesion at the lateral canthus of the left  lower lid.  No erythema or drainage  Physical Exam    11/08/2022    1:36 PM 11/12/2021   10:04 AM 11/12/2021    5:12 AM  Vitals with BMI  Height 5\' 3"     Weight 208 lbs 3 oz    BMI 36.89    Systolic 150 161 829  Diastolic 80 64 68  Pulse 80 61 59    General:  No acute distress,  Alert and oriented, Non-Toxic, Normal speech and affect Integument: 2 mm skin lesion at the left lateral canthus of the lower lid.  No associated findings Mammogram: Not applicable Assessment/Plan Skin lesion: Will plan excision under local in the office.  Patient understands that she will have a stitch in the area that will need to be removed.  She also understands that based on the pathologic findings she may require additional procedures.  All questions answered to her satisfaction.  Photographs obtained today with her consent.  Will schedule at her request.  Autumn Green 11/08/2022, 1:54 PM

## 2022-12-12 ENCOUNTER — Ambulatory Visit (INDEPENDENT_AMBULATORY_CARE_PROVIDER_SITE_OTHER): Payer: No Typology Code available for payment source | Admitting: Plastic Surgery

## 2022-12-12 DIAGNOSIS — L720 Epidermal cyst: Secondary | ICD-10-CM

## 2022-12-12 DIAGNOSIS — L989 Disorder of the skin and subcutaneous tissue, unspecified: Secondary | ICD-10-CM

## 2022-12-12 NOTE — Progress Notes (Signed)
Procedure Note  Preoperative Dx: Skin lesion, left lateral canthus  Postoperative Dx: Same  Procedure: Excision of skin lesion  Anesthesia: Lidocaine 1% with 1:100,000 epinephrine and 0.25% Sensorcaine   Indication for Procedure: Removal for pathologic diagnosis  Description of Procedure: Risks and complications were explained to the patient including the need for additional procedures based on pathology.  Consent was confirmed and the patient understands the risks and benefits.  The potential complications and alternatives were explained and the patient consents.  The patient expressed understanding the option of not having the procedure and the risks of a scar.  Time out was called and all information was confirmed to be correct.    The area was prepped and drapped.  Local anesthetic was injected in the subcutaneous tissues.  After waiting for the local to take affect the skin lesion was excised.  After obtaining hemostasis, the surgical wound was closed with a simple 5-0 Prolene suture.  The surgical wound measured 5 mm, the skin lesion measured 2 mm.  A dressing was applied.  The patient was given instructions on how to care for the area and a follow up appointment.  Autumn Green tolerated the procedure well and there were no complications. The specimen was sent to pathology.

## 2022-12-14 LAB — DERMATOLOGY PATHOLOGY

## 2022-12-19 ENCOUNTER — Encounter: Payer: Self-pay | Admitting: Surgical

## 2022-12-19 ENCOUNTER — Ambulatory Visit: Payer: No Typology Code available for payment source | Admitting: Plastic Surgery

## 2022-12-19 ENCOUNTER — Ambulatory Visit (INDEPENDENT_AMBULATORY_CARE_PROVIDER_SITE_OTHER): Payer: No Typology Code available for payment source | Admitting: Surgical

## 2022-12-19 VITALS — BP 174/83 | HR 80

## 2022-12-19 DIAGNOSIS — L989 Disorder of the skin and subcutaneous tissue, unspecified: Secondary | ICD-10-CM

## 2022-12-19 NOTE — Progress Notes (Signed)
62 year old female here for follow-up after excision of left lateral canthus skin lesion with Dr. Ladona Ridgel on 12/12/2022.  5-0 Prolene was used for the skin closure.  Patient is 7 days post procedure.  Pathology showed epidermoid cyst.  Patient is doing well, she reports some itching and tenderness from the suture.  She has no complaints or issues at this time.  We discussed pathology result.  On exam the incision is intact, Prolene sutures noted.  Prolene suture was removed and patient tolerated this well. There is no erythema or cellulitic changes noted.    Recommend following up as needed, call with questions or concerns.

## 2023-05-23 ENCOUNTER — Emergency Department (HOSPITAL_COMMUNITY)
Admission: EM | Admit: 2023-05-23 | Discharge: 2023-05-23 | Disposition: A | Discharge status: Home / Self Care | Attending: Emergency Medicine | Admitting: Emergency Medicine

## 2023-05-23 ENCOUNTER — Encounter (HOSPITAL_COMMUNITY): Payer: Self-pay | Admitting: *Deleted

## 2023-05-23 ENCOUNTER — Encounter (HOSPITAL_COMMUNITY): Payer: Self-pay

## 2023-05-23 ENCOUNTER — Other Ambulatory Visit: Payer: Self-pay

## 2023-05-23 ENCOUNTER — Emergency Department (HOSPITAL_COMMUNITY)

## 2023-05-23 ENCOUNTER — Ambulatory Visit (HOSPITAL_COMMUNITY)
Admission: EM | Admit: 2023-05-23 | Discharge: 2023-05-23 | Disposition: A | Discharge status: Home / Self Care | Attending: Family Medicine | Admitting: Family Medicine

## 2023-05-23 DIAGNOSIS — R079 Chest pain, unspecified: Secondary | ICD-10-CM

## 2023-05-23 DIAGNOSIS — Z85850 Personal history of malignant neoplasm of thyroid: Secondary | ICD-10-CM | POA: Insufficient documentation

## 2023-05-23 DIAGNOSIS — I10 Essential (primary) hypertension: Secondary | ICD-10-CM | POA: Insufficient documentation

## 2023-05-23 DIAGNOSIS — Z85528 Personal history of other malignant neoplasm of kidney: Secondary | ICD-10-CM | POA: Insufficient documentation

## 2023-05-23 DIAGNOSIS — Z79899 Other long term (current) drug therapy: Secondary | ICD-10-CM | POA: Diagnosis not present

## 2023-05-23 DIAGNOSIS — R0789 Other chest pain: Secondary | ICD-10-CM | POA: Insufficient documentation

## 2023-05-23 LAB — CBC
HCT: 38.3 % (ref 36.0–46.0)
Hemoglobin: 12.3 g/dL (ref 12.0–15.0)
MCH: 30 pg (ref 26.0–34.0)
MCHC: 32.1 g/dL (ref 30.0–36.0)
MCV: 93.4 fL (ref 80.0–100.0)
Platelets: 363 10*3/uL (ref 150–400)
RBC: 4.1 MIL/uL (ref 3.87–5.11)
RDW: 13.9 % (ref 11.5–15.5)
WBC: 7.7 10*3/uL (ref 4.0–10.5)
nRBC: 0 % (ref 0.0–0.2)

## 2023-05-23 LAB — BASIC METABOLIC PANEL
Anion gap: 9 (ref 5–15)
BUN: 21 mg/dL (ref 8–23)
CO2: 25 mmol/L (ref 22–32)
Calcium: 9.6 mg/dL (ref 8.9–10.3)
Chloride: 105 mmol/L (ref 98–111)
Creatinine, Ser: 0.92 mg/dL (ref 0.44–1.00)
GFR, Estimated: >60 mL/min (ref >60)
Glucose, Bld: 84 mg/dL (ref 70–99)
Potassium: 3.9 mmol/L (ref 3.5–5.1)
Sodium: 139 mmol/L (ref 135–145)

## 2023-05-23 LAB — TROPONIN I (HIGH SENSITIVITY)
Troponin I (High Sensitivity): 5 ng/L (ref <18)
Troponin I (High Sensitivity): 3 ng/L (ref <18)

## 2023-05-23 MED ORDER — CYCLOBENZAPRINE HCL 10 MG PO TABS: 10.0000 mg | ORAL_TABLET | Freq: Two times a day (BID) | ORAL | 0 refills | Status: AC | PRN

## 2023-05-23 MED ORDER — NAPROXEN 250 MG PO TABS
500.0000 mg | ORAL_TABLET | Freq: Once | ORAL | Status: DC
  Filled 2023-05-23: qty 2

## 2023-05-23 MED ORDER — NAPROXEN 500 MG PO TABS: 500.0000 mg | ORAL_TABLET | Freq: Two times a day (BID) | ORAL | 0 refills | Status: AC

## 2023-05-23 MED ORDER — ASPIRIN 325 MG PO TBEC
325.0000 mg | DELAYED_RELEASE_TABLET | Freq: Once | ORAL | Status: AC
  Administered 2023-05-23: 325 mg via ORAL
  Filled 2023-05-23: qty 1

## 2023-05-23 MED ORDER — CYCLOBENZAPRINE HCL 10 MG PO TABS
5.0000 mg | ORAL_TABLET | Freq: Once | ORAL | Status: AC
  Administered 2023-05-23: 5 mg via ORAL
  Filled 2023-05-23: qty 1

## 2023-05-23 NOTE — ED Provider Triage Note (Signed)
 Emergency Medicine Provider Triage Evaluation Note  Autumn Green , a 63 y.o. female  was evaluated in triage.  Pt complains of chest pain.  Patient reports persistent chest pain at the side of her chest that began this morning.  She states that pain is worse with deep breath and with movement.  Took Aleve for her symptoms prior to arrival without any improvement.  Was seen at urgent care but advised to come here for further evaluation.  Review of Systems  Positive: CP, shortness of breath Negative: Fevers, leg swelling  Physical Exam  BP (!) 182/79 (BP Location: Right Arm)   Pulse 70   Temp 97.9 F (36.6 C) (Oral)   Resp 17   Ht 5\' 3"  (1.6 m)   Wt 93.4 kg   SpO2 98%   BMI 36.49 kg/m  Gen:   Awake, no distress   Resp:  Normal effort  MSK:   Moves extremities without difficulty  Other:    Medical Decision Making  Medically screening exam initiated at 4:23 PM.  Appropriate orders placed.  ARDYN FORGE was informed that the remainder of the evaluation will be completed by another provider, this initial triage assessment does not replace that evaluation, and the importance of remaining in the ED until their evaluation is complete.   Maxwell Marion, PA-C 05/23/23 1624

## 2023-05-23 NOTE — ED Provider Notes (Signed)
 [place] EMERGENCY DEPARTMENT AT [facility] Provider Note   CSN: [id number] Arrival date & time: 05/23/23  1545     History  Chief Complaint  Patient presents with   Chest Pain    Autumn Green is a 63 y.o. 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  Patient reports that she was in the kitchen cooking when she had pain over her left chest/breast that radiates down to her left arm.  Pain is worse deep inspiration and with movement.  Denies shortness of breath.  Denies recent episodes of emesis.  Denies recent viral illnesses.  Denies cough.  She took Aleve this morning without any resolution of the pain.  She was evaluated in an urgent care which conducted an EKG that was similar to prior EKGs and without significant ST elevation or T wave changes.  She denies recent extreme physical activity and lifting.  She denies any first relatives with sudden cardiac death or with CAD.   Chest Pain      Home Medications Prior to Admission medications   Medication Sig Start Date End Date Taking? Authorizing Provider  cyclobenzaprine (FLEXERIL) 10 MG tablet Take 1 tablet (10 mg total) by mouth 2 (two) times daily as needed for muscle spasms. 05/23/23  Yes Renella Cunas, MD  naproxen (NAPROSYN) 500 MG tablet Take 1 tablet (500 mg total) by mouth 2 (two) times daily. 05/23/23  Yes Renella Cunas, MD  acetaminophen (TYLENOL) 500 MG tablet Take 1,000 mg by mouth 3 (three) times daily as needed for headache, fever or mild pain.    [provider]  Ascorbic Acid (VITAMIN C PO) Take 1 tablet by mouth daily.    [provider]  Cholecalciferol (VITAMIN D3) 50 MCG (2000 UT) capsule Take 2,000 Units by mouth daily.    [provider]  cycloSPORINE (RESTASIS) 0.05 % ophthalmic  emulsion Place 1 drop into both eyes 2 (two) times daily.    [provider]  diltiazem (DILACOR XR) 180 MG 24 hr capsule Take 180 mg by mouth daily.    [provider]  hydrochlorothiazide (HYDRODIURIL) 25 MG tablet Take 25 mg by mouth daily.    [provider]  levothyroxine (SYNTHROID) 112 MCG tablet Take 112 mcg by mouth daily.    [provider]  losartan (COZAAR) 100 MG tablet Take 50 mg by mouth daily.    [provider]  meloxicam (MOBIC) 15 MG tablet Take 15 mg by mouth daily as needed for pain. 10/26/21   [provider]  Multiple Vitamin (MULTIVITAMIN) tablet Take 1 tablet by mouth daily.    [provider]  Multiple Vitamins-Minerals (ZINC PO) Take 1 tablet by mouth daily.    [provider]  pantoprazole (PROTONIX) 40 MG tablet Take 40 mg by mouth daily.    [provider]  PRALUENT 75 MG/ML SOAJ Inject 75 mg into the skin every 14 (fourteen) days. 10/24/21   [provider]  sertraline (ZOLOFT) 100 MG tablet Take 100 mg by mouth daily. 10/26/21   [provider]  SYSTANE BALANCE 0.6 % SOLN Place 1 drop into both eyes 4 (four) times daily as needed (dry eyes). 10/26/21   [provider]  traZODone (DESYREL) 100 MG tablet Take 100 mg by mouth at  bedtime as needed for sleep. 10/26/21   [provider]      Allergies    Aspirin, Effexor [venlafaxine], Lipitor [atorvastatin], Neurontin [gabapentin], Nsaids, and Zestril [lisinopril]    Review of Systems   Review of Systems  Cardiovascular:  Positive for chest pain.    Physical Exam Updated Vital Signs BP (!) 163/83   Pulse 60   Temp 97.8 F (36.6 C)   Resp 18   Ht 5\' 3"  (1.6 m)   Wt 93.4 kg   SpO2 100%   BMI 36.49 kg/m  Physical Exam Vitals and nursing note reviewed.  Constitutional:      General: She is not in acute distress.    Appearance: Normal appearance. She is not ill-appearing.  Cardiovascular:      Rate and Rhythm: Normal rate and regular rhythm.     Pulses:          Radial pulses are 2+ on the right side and 2+ on the left side.     Heart sounds: Normal heart sounds. No murmur heard. Pulmonary:     Effort: Pulmonary effort is normal.     Breath sounds: Normal breath sounds. No decreased breath sounds, wheezing, rhonchi or rales.  Chest:     Chest wall: No lacerations, deformity, swelling, crepitus or edema.     Comments: Significant tenderness to palpation over left anterior chest, left breast, left inframammary fold, left axilla Skin:    Comments: Small well-healed scar over anterior central chest  Neurological:     Mental Status: She is alert.  Psychiatric:        Behavior: Behavior is cooperative.     ED Results / Procedures / Treatments   Labs (all labs ordered are listed, but only abnormal results are displayed) Labs Reviewed  BASIC METABOLIC PANEL  CBC  TROPONIN I (HIGH SENSITIVITY)  TROPONIN I (HIGH SENSITIVITY)    EKG None  Radiology DG Chest 2 View Result Date: 05/23/2023 CLINICAL DATA:  Chest pain EXAM: CHEST - 2 VIEW COMPARISON:  Chest x-ray 11/08/2021 FINDINGS: There are minimal patchy opacities in the right lung apex with small nodular density measuring 5 mm, unchanged from prior. No new focal lung infiltrate, pleural effusion or pneumothorax. Cardiomediastinal silhouette is within normal limits. No acute osseous abnormality. IMPRESSION: 1. No acute cardiopulmonary process. 2. Minimal patchy opacities in the right lung apex with small nodular density measuring 5 mm, unchanged from prior. Recommend further evaluation with chest CT. Electronically Signed   By: Darliss Cheney M.D.   On: 05/23/2023 19:51    Procedures Procedures    Medications Ordered in ED Medications  naproxen (NAPROSYN) tablet 500 mg (500 mg Oral Patient Refused/Not Given 05/23/23 2237)  aspirin EC tablet 325 mg (325 mg Oral Given 05/23/23 1641)  cyclobenzaprine (FLEXERIL) tablet 5 mg (5  mg Oral Given 05/23/23 2238)    ED Course/ Medical Decision Making/ A&P             HEART Score: 4                    Medical Decision Making Amount and/or Complexity of Data Reviewed Labs: ordered. Radiology: ordered.   Vital signs reassuring.  Patient is satting well on room air.  Physical exam is notable for tenderness to palpation over the left chest.  Regular rate and rhythm with no murmurs auscultated.  Differential includes ACS, PE, MSK, costochondritis, myocarditis.  Low suspicion for ACS given reassuring EKG and troponin of 3.  Pain is not positional and no recent viral illnesses, therefore lower suspicion for myocarditis.  While patient cannot be PERC'd out due to age, Megan Mans notable only for 1 point for history of cancer.  Both cancers were surgically removed and did not require any chemotherapy or radiation.  Therefore I have low suspicion for patient being hypercoagulable at this time.  Low suspicion for pulmonary embolism as a source of patient's symptoms.  Patient is significantly tender to palpation over left chest, and highest suspicion is for MSK pathology.  Patient will be treated with naproxen and Flexeril here in the ED and discharged with a prescription for the medications.  Advised patient to follow-up with PCP for further evaluation.  Final Clinical Impression(s) / ED Diagnoses Final diagnoses:  Chest wall pain    Rx / DC Orders ED Discharge Orders          Ordered    naproxen (NAPROSYN) 500 MG tablet  2 times daily        05/23/23 2232    cyclobenzaprine (FLEXERIL) 10 MG tablet  2 times daily PRN        05/23/23 2232           Renella Cunas, PGY-2 Emergency Medicine   Renella Cunas, MD 05/24/23 Aretha Parrot    Eber Hong, MD 05/24/23 [phone]

## 2023-05-23 NOTE — ED Triage Notes (Signed)
 Pt reports sharp chest pain onset today around 12pm. She reports the pain in the center of her chest and radiates to the left side and into her left shoulder. She reports mild shortness of breath and nausea.

## 2023-05-23 NOTE — ED Provider Notes (Incomplete)
 [place] EMERGENCY DEPARTMENT AT [facility] Provider Note   CSN: [id number] Arrival date & time: 05/23/23  1545     History {Add pertinent medical, surgical, social history, OB history to HPI:1} Chief Complaint  Patient presents with  . Chest Pain    Autumn Green is a 63 y.o. female   Chest Pain      Home Medications Prior to Admission medications   Medication Sig Start Date End Date Taking? Authorizing Provider  acetaminophen (TYLENOL) 500 MG tablet Take 1,000 mg by mouth 3 (three) times daily as needed for headache, fever or mild pain.    [provider]  Ascorbic Acid (VITAMIN C PO) Take 1 tablet by mouth daily.    [provider]  Cholecalciferol (VITAMIN D3) 50 MCG (2000 UT) capsule Take 2,000 Units by mouth daily.    [provider]  cyclobenzaprine (FLEXERIL) 10 MG tablet Take 1 tablet (10 mg total) by mouth 3 (three) times daily as needed for muscle spasms. 08/05/18   Arnette Felts, FNP  cycloSPORINE (RESTASIS) 0.05 % ophthalmic emulsion Place 1 drop into both eyes 2 (two) times daily.    [provider]  diltiazem (DILACOR XR) 180 MG 24 hr capsule Take 180 mg by mouth daily.    [provider]  hydrochlorothiazide (HYDRODIURIL) 25 MG tablet Take 25 mg by mouth daily.    [provider]  levothyroxine (SYNTHROID) 112 MCG tablet Take 112 mcg by mouth daily.    [provider]  losartan (COZAAR) 100 MG tablet Take 50 mg by mouth daily.    [provider]  meloxicam (MOBIC) 15 MG tablet Take 15 mg by mouth daily as needed for pain. 10/26/21   [provider]  Multiple Vitamin (MULTIVITAMIN) tablet Take 1 tablet by mouth daily.    [provider]  Multiple Vitamins-Minerals (ZINC PO) Take 1 tablet by mouth daily.    [provider]  pantoprazole (PROTONIX) 40 MG tablet Take 40 mg by mouth daily.    [provider]  PRALUENT 75 MG/ML SOAJ Inject 75 mg into  the skin every 14 (fourteen) days. 10/24/21   [provider]  sertraline (ZOLOFT) 100 MG tablet Take 100 mg by mouth daily. 10/26/21   [provider]  SYSTANE BALANCE 0.6 % SOLN Place 1 drop into both eyes 4 (four) times daily as needed (dry eyes). 10/26/21   [provider]  traZODone (DESYREL) 100 MG tablet Take 100 mg by mouth at bedtime as needed for sleep. 10/26/21   [provider]      Allergies    Aspirin, Effexor [venlafaxine], Lipitor [atorvastatin], Neurontin [gabapentin], Nsaids, and Zestril [lisinopril]    Review of Systems   Review of Systems  Cardiovascular:  Positive for chest pain.    Physical Exam Updated Vital Signs BP (!) 171/78 (BP Location: Right Arm)   Pulse (!) 58   Temp 97.8 F (36.6 C)   Resp 18   Ht 5\' 3"  (1.6 m)   Wt 93.4 kg   SpO2 100%   BMI 36.49 kg/m  Physical Exam  ED Results / Procedures / Treatments   Labs (all labs ordered are listed, but only abnormal results are displayed) Labs Reviewed  BASIC METABOLIC PANEL  CBC  TROPONIN I (HIGH SENSITIVITY)  TROPONIN I (HIGH SENSITIVITY)    EKG None  Radiology DG Chest 2 View Result Date: 05/23/2023 CLINICAL DATA:  Chest pain EXAM: CHEST - 2 VIEW COMPARISON:  Chest  x-ray 11/08/2021 FINDINGS: There are minimal patchy opacities in the right lung apex with small nodular density measuring 5 mm, unchanged from prior. No new focal lung infiltrate, pleural effusion or pneumothorax. Cardiomediastinal silhouette is within normal limits. No acute osseous abnormality. IMPRESSION: 1. No acute cardiopulmonary process. 2. Minimal patchy opacities in the right lung apex with small nodular density measuring 5 mm, unchanged from prior. Recommend further evaluation with chest CT. Electronically Signed   By: Darliss Cheney M.D.   On: 05/23/2023 19:51    Procedures Procedures  {Document cardiac monitor, telemetry assessment procedure when appropriate:1}  Medications Ordered in  ED Medications  aspirin EC tablet 325 mg (325 mg Oral Given 05/23/23 1641)    ED Course/ Medical Decision Making/ A&P   {   Click here for ABCD2, HEART and other calculatorsREFRESH Note before signing :1}                              Medical Decision Making Amount and/or Complexity of Data Reviewed Labs: ordered. Radiology: ordered.   ***  {Document critical care time when appropriate:1} {Document review of labs and clinical decision tools ie heart score, Chads2Vasc2 etc:1}  {Document your independent review of radiology images, and any outside records:1} {Document your discussion with family members, caretakers, and with consultants:1} {Document social determinants of health affecting pt's care:1} {Document your decision making why or why not admission, treatments were needed:1} Final Clinical Impression(s) / ED Diagnoses Final diagnoses:  None    Rx / DC Orders ED Discharge Orders     None

## 2023-05-23 NOTE — Discharge Instructions (Addendum)
 You presented to the ED with a left-sided chest pain, concerning for chest wall pain.  Labs are reassuring.  Your troponins were negative.  Your EKG does not show a heart attack and appears similar to your EKG from 2023.  To treat the chest wall pain, we will trial naproxen and a muscle relaxer.  We hope that this helps your symptoms.  Please follow-up with your primary care doctor for further evaluation.

## 2023-05-23 NOTE — ED Triage Notes (Signed)
 PT reports Lt sided CP started 4 hrs that radiates under Lt breast up Lt shoulder.

## 2023-05-23 NOTE — ED Provider Notes (Signed)
 [facility]    CSN: [id number] Arrival date & time: 05/23/23  1515      History   Chief Complaint Chief Complaint  Patient presents with   Chest Pain    HPI Autumn Green is a 63 y.o. female.   Patient presents to urgent care today with 4 hour history of sudden left sided chest pain that she currently rates as a 10/10.  Reports the pain encompasses her entire left breast and left sided chest.  Pain not currently radiating up neck or down left arm.  Denies significant physical activity of heavy lifting/straining when the pain began.  She endorses some shortness of breath with the pain.  No known alleviating factors.  Pain is worse with any movement.  Has not taken or tried anything for the pain so far.  She takes medication for high blood pressure and high cholesterol.  Of note, she had heart cath in 2014 that was normal.    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   Acute febrile illness    Other headache syndrome    HSV-2 (herpes simplex virus 2) infection    Fatigue 08/05/2018   Cervicalgia 08/05/2018   Vitamin D deficiency 08/05/2018   Acute recurrent maxillary sinusitis 05/19/2018   Left sided numbness 04/26/2017   Hypothyroidism    Acute bronchitis 08/08/2016   Chest wall pain 08/08/2016   Hyperlipidemia 04/17/2016   Hypertension 08/18/2012    Past Surgical History:  Procedure Laterality Date   ABDOMINAL HYSTERECTOMY     2004   BACK SURGERY     2009 dicsectomy   BREAST SURGERY     had 5 or 6 cyst removed from breast, starting 1979   LEFT HEART CATHETERIZATION WITH CORONARY  ANGIOGRAM N/A 10/14/2012   Procedure: LEFT HEART CATHETERIZATION WITH CORONARY ANGIOGRAM;  Surgeon: Pamella Pert, MD;  Location: [facility] C[facility] LAB;  Service: Cardiovascular;  Laterality: N/A;   LUMBAR DISC SURGERY  07/17/2011   decompression    OB History   No obstetric history on file.      Home Medications    Prior to Admission medications   Medication Sig Start Date End Date Taking? Authorizing Provider  acetaminophen (TYLENOL) 500 MG tablet Take 1,000 mg by mouth 3 (three) times daily as needed for headache, fever or mild pain.   Yes [provider]  Ascorbic Acid (VITAMIN C PO) Take 1 tablet by mouth daily.   Yes [provider]  Cholecalciferol (VITAMIN D3) 50 MCG (2000 UT) capsule Take 2,000 Units by mouth daily.   Yes [provider]  cycloSPORINE (RESTASIS) 0.05 % ophthalmic emulsion Place 1 drop into both eyes 2 (two) times daily.   Yes [provider]  diltiazem (DILACOR XR) 180 MG 24 hr capsule Take 180 mg by mouth daily.   Yes [provider]  hydrochlorothiazide (HYDRODIURIL) 25 MG tablet Take 25 mg by mouth daily.   Yes [provider]  levothyroxine (SYNTHROID) 112 MCG tablet Take 112 mcg by mouth daily.   Yes [provider]  losartan (COZAAR) 100 MG tablet Take 50 mg by mouth daily.   Yes [provider]  meloxicam (MOBIC) 15 MG tablet Take 15 mg by mouth daily as needed for pain. 10/26/21  Yes [provider]  Multiple Vitamin (MULTIVITAMIN) tablet Take 1 tablet by mouth daily.   Yes [provider]  Multiple Vitamins-Minerals (ZINC PO) Take 1 tablet by mouth daily.   Yes [provider]  pantoprazole (PROTONIX) 40 MG tablet Take 40 mg by mouth daily.   Yes [provider]  sertraline (ZOLOFT) 100 MG tablet Take 100 mg by mouth daily. 10/26/21  Yes [provider]  traZODone (DESYREL) 100 MG tablet Take 100 mg by mouth at bedtime as needed for sleep. 10/26/21   Yes [provider]  cyclobenzaprine (FLEXERIL) 10 MG tablet Take 1 tablet (10 mg total) by mouth 3 (three) times daily as needed for muscle spasms. 08/05/18   Arnette Felts, FNP  PRALUENT 75 MG/ML SOAJ Inject 75 mg into the skin every 14 (fourteen) days. 10/24/21   [provider]  SYSTANE BALANCE 0.6 % SOLN Place 1 drop into both eyes 4 (four) times daily as needed (dry eyes). 10/26/21   [provider]    Family History Family History  Problem Relation Age of Onset   Colon cancer Mother    Heart attack Father    Bone cancer Brother    Thyroid disease Maternal Grandmother    Thyroid disease Maternal Aunt    Thyroid disease Maternal Uncle    Heart attack Brother    Stroke Brother    Hypotension Neg Hx    Malignant hyperthermia Neg Hx    Pseudochol deficiency Neg Hx     Social History Social History   Tobacco Use   Smoking status: Never   Smokeless tobacco: Never  Substance Use Topics   Alcohol use: No   Drug use: No     Allergies   Aspirin, Effexor [venlafaxine], Lipitor [atorvastatin], Neurontin [gabapentin], Nsaids, and Zestril [lisinopril]   Review of Systems Review of Systems Per HPI  Physical Exam Triage Vital Signs ED Triage Vitals  Encounter Vitals Group     BP 05/23/23 1526 (!) 162/101     Systolic BP Percentile --      Diastolic BP Percentile --      Pulse Rate 05/23/23 1526 73     Resp --      Temp --      Temp src --      SpO2 05/23/23 1526 100 %     Weight --      Height --      Head Circumference --      Peak Flow --      Pain Score 05/23/23 1522 10     Pain Loc --      Pain Education --      Exclude from Growth Chart --    No data found.  Updated Vital Signs BP (!) 162/101   Pulse 73   SpO2 100%   Visual Acuity Right Eye Distance:   Left Eye Distance:   Bilateral Distance:    Right Eye Near:   Left Eye Near:    Bilateral Near:     Physical Exam Vitals and nursing note reviewed.  Constitutional:       General: She is not in acute distress.    Appearance: She is well-developed. She is obese. She is not toxic-appearing.  HENT:  Head: Normocephalic and atraumatic.  Cardiovascular:     Rate and Rhythm: Normal rate and regular rhythm.     Heart sounds: Normal heart sounds.  Pulmonary:     Effort: Pulmonary effort is normal. No tachypnea, accessory muscle usage or respiratory distress.     Breath sounds: Normal breath sounds. No stridor.  Chest:     Chest wall: Tenderness present.  Musculoskeletal:        General: Normal range of motion.     Cervical back: Normal range of motion.  Lymphadenopathy:     Cervical: No cervical adenopathy.  Skin:    General: Skin is warm and dry.     Capillary Refill: Capillary refill takes less than 2 seconds.     Coloration: Skin is not cyanotic or pale.     Findings: No erythema.  Neurological:     Mental Status: She is alert and oriented to person, place, and time.  Psychiatric:        Behavior: Behavior is cooperative.      UC Treatments / Results  Labs (all labs ordered are listed, but only abnormal results are displayed) Labs Reviewed - No data to display  EKG   Radiology No results found.  Procedures Procedures (including critical care time)  Medications Ordered in UC Medications - No data to display  Initial Impression / Assessment and Plan / UC Course  I have reviewed the triage vital signs and the nursing notes.  Pertinent labs & imaging results that were available during my care of the patient were reviewed by me and considered in my medical decision making (see chart for details).   Patient is hypertensive in triage, otherwise, vitals signs are stable.  1. Chest pain, unspecified type 2. Elevated blood pressure reading with diagnosis of hypertension EKG today shows ventricular rate of 65 beats per minute and is without significant ST segment or T wave changes when compared with previous EKG Given medical history and  presentation, I recommended further evaluation and management in ER Patient is in agreement to plan and is safe to transport via private vehicle at this time.   The patient was given the opportunity to ask questions.  All questions answered to their satisfaction.  The patient is in agreement to this plan.    Final Clinical Impressions(s) / UC Diagnoses   Final diagnoses:  Chest pain, unspecified type  Elevated blood pressure reading with diagnosis of hypertension   Discharge Instructions   None    ED Prescriptions   None    PDMP not reviewed this encounter.   Valentino Nose, NP 05/23/23 [phone]
